# Patient Record
Sex: Female | Born: 1950 | Race: White | Hispanic: No | Marital: Married | Smoking: Never smoker
Health system: Southern US, Community
[De-identification: ages and names within clinical notes are randomized; demographics above are authoritative.]

## PROBLEM LIST (undated history)

## (undated) DIAGNOSIS — T8859XA Other complications of anesthesia, initial encounter: Secondary | ICD-10-CM

## (undated) DIAGNOSIS — H269 Unspecified cataract: Secondary | ICD-10-CM

## (undated) DIAGNOSIS — M21372 Foot drop, left foot: Secondary | ICD-10-CM

## (undated) DIAGNOSIS — I10 Essential (primary) hypertension: Secondary | ICD-10-CM

## (undated) DIAGNOSIS — E119 Type 2 diabetes mellitus without complications: Secondary | ICD-10-CM

## (undated) DIAGNOSIS — E7800 Pure hypercholesterolemia, unspecified: Secondary | ICD-10-CM

## (undated) DIAGNOSIS — K219 Gastro-esophageal reflux disease without esophagitis: Secondary | ICD-10-CM

## (undated) DIAGNOSIS — M21371 Foot drop, right foot: Secondary | ICD-10-CM

## (undated) DIAGNOSIS — G629 Polyneuropathy, unspecified: Secondary | ICD-10-CM

## (undated) DIAGNOSIS — M1990 Unspecified osteoarthritis, unspecified site: Secondary | ICD-10-CM

## (undated) DIAGNOSIS — M549 Dorsalgia, unspecified: Secondary | ICD-10-CM

## (undated) DIAGNOSIS — R233 Spontaneous ecchymoses: Secondary | ICD-10-CM

## (undated) DIAGNOSIS — T4145XA Adverse effect of unspecified anesthetic, initial encounter: Secondary | ICD-10-CM

## (undated) DIAGNOSIS — G971 Other reaction to spinal and lumbar puncture: Secondary | ICD-10-CM

## (undated) DIAGNOSIS — C801 Malignant (primary) neoplasm, unspecified: Secondary | ICD-10-CM

## (undated) DIAGNOSIS — M109 Gout, unspecified: Secondary | ICD-10-CM

## (undated) DIAGNOSIS — Z9889 Other specified postprocedural states: Secondary | ICD-10-CM

## (undated) DIAGNOSIS — R238 Other skin changes: Secondary | ICD-10-CM

## (undated) DIAGNOSIS — R112 Nausea with vomiting, unspecified: Secondary | ICD-10-CM

## (undated) DIAGNOSIS — G8929 Other chronic pain: Secondary | ICD-10-CM

## (undated) HISTORY — DX: Essential (primary) hypertension: I10

## (undated) HISTORY — PX: OTHER SURGICAL HISTORY: SHX169

## (undated) HISTORY — PX: CHOLECYSTECTOMY: SHX55

## (undated) HISTORY — DX: Pure hypercholesterolemia, unspecified: E78.00

## (undated) HISTORY — PX: COLONOSCOPY: SHX174

## (undated) HISTORY — PX: TOTAL ELBOW REPLACEMENT: SUR1214

## (undated) HISTORY — PX: STAPEDES SURGERY: SHX789

## (undated) HISTORY — PX: COLONOSCOPY WITH ESOPHAGOGASTRODUODENOSCOPY (EGD): SHX5779

## (undated) HISTORY — PX: JOINT REPLACEMENT: SHX530

## (undated) HISTORY — PX: ABDOMINAL HYSTERECTOMY: SHX81

## (undated) HISTORY — DX: Polyneuropathy, unspecified: G62.9

---

## 1978-10-23 HISTORY — PX: BACK SURGERY: SHX140

## 1987-10-24 DIAGNOSIS — C801 Malignant (primary) neoplasm, unspecified: Secondary | ICD-10-CM

## 1987-10-24 HISTORY — DX: Malignant (primary) neoplasm, unspecified: C80.1

## 2015-08-14 ENCOUNTER — Other Ambulatory Visit: Payer: Self-pay | Admitting: Orthopedic Surgery

## 2015-08-14 DIAGNOSIS — M5416 Radiculopathy, lumbar region: Secondary | ICD-10-CM

## 2015-08-31 ENCOUNTER — Ambulatory Visit
Admission: RE | Admit: 2015-08-31 | Discharge: 2015-08-31 | Disposition: A | Discharge status: Home / Self Care | Payer: Medicare HMO | Source: Ambulatory Visit | Attending: Orthopedic Surgery | Admitting: Orthopedic Surgery

## 2015-08-31 ENCOUNTER — Other Ambulatory Visit: Payer: Self-pay

## 2015-08-31 DIAGNOSIS — M5416 Radiculopathy, lumbar region: Secondary | ICD-10-CM

## 2015-09-01 ENCOUNTER — Ambulatory Visit
Admission: RE | Admit: 2015-09-01 | Discharge: 2015-09-01 | Disposition: A | Discharge status: Home / Self Care | Payer: Medicare HMO | Source: Ambulatory Visit | Attending: Orthopedic Surgery | Admitting: Orthopedic Surgery

## 2015-09-07 ENCOUNTER — Other Ambulatory Visit: Payer: Self-pay | Admitting: Orthopedic Surgery

## 2015-09-07 DIAGNOSIS — M546 Pain in thoracic spine: Secondary | ICD-10-CM

## 2015-09-24 ENCOUNTER — Ambulatory Visit
Admission: RE | Admit: 2015-09-24 | Discharge: 2015-09-24 | Disposition: A | Discharge status: Home / Self Care | Payer: Medicare HMO | Source: Ambulatory Visit | Attending: Orthopedic Surgery | Admitting: Orthopedic Surgery

## 2015-09-24 DIAGNOSIS — M546 Pain in thoracic spine: Secondary | ICD-10-CM

## 2015-10-26 DIAGNOSIS — M5416 Radiculopathy, lumbar region: Secondary | ICD-10-CM | POA: Diagnosis not present

## 2015-11-11 DIAGNOSIS — Z6837 Body mass index (BMI) 37.0-37.9, adult: Secondary | ICD-10-CM | POA: Diagnosis not present

## 2015-11-11 DIAGNOSIS — M4806 Spinal stenosis, lumbar region: Secondary | ICD-10-CM | POA: Diagnosis not present

## 2015-11-17 ENCOUNTER — Other Ambulatory Visit: Payer: Self-pay | Admitting: Neurological Surgery

## 2015-11-17 DIAGNOSIS — Z6837 Body mass index (BMI) 37.0-37.9, adult: Secondary | ICD-10-CM | POA: Diagnosis not present

## 2015-11-17 DIAGNOSIS — I10 Essential (primary) hypertension: Secondary | ICD-10-CM | POA: Diagnosis not present

## 2015-11-17 DIAGNOSIS — M4806 Spinal stenosis, lumbar region: Secondary | ICD-10-CM | POA: Diagnosis not present

## 2015-11-19 ENCOUNTER — Encounter (HOSPITAL_COMMUNITY)
Admission: RE | Admit: 2015-11-19 | Discharge: 2015-11-19 | Disposition: A | Discharge status: Home / Self Care | Payer: PPO | Source: Ambulatory Visit | Attending: Neurological Surgery | Admitting: Neurological Surgery

## 2015-11-19 ENCOUNTER — Encounter (HOSPITAL_COMMUNITY): Payer: Self-pay

## 2015-11-19 DIAGNOSIS — Z0183 Encounter for blood typing: Secondary | ICD-10-CM | POA: Insufficient documentation

## 2015-11-19 DIAGNOSIS — M4806 Spinal stenosis, lumbar region: Secondary | ICD-10-CM | POA: Insufficient documentation

## 2015-11-19 DIAGNOSIS — Z01812 Encounter for preprocedural laboratory examination: Secondary | ICD-10-CM | POA: Diagnosis not present

## 2015-11-19 HISTORY — DX: Foot drop, right foot: M21.371

## 2015-11-19 HISTORY — DX: Malignant (primary) neoplasm, unspecified: C80.1

## 2015-11-19 HISTORY — DX: Other specified postprocedural states: Z98.890

## 2015-11-19 HISTORY — DX: Other complications of anesthesia, initial encounter: T88.59XA

## 2015-11-19 HISTORY — DX: Type 2 diabetes mellitus without complications: E11.9

## 2015-11-19 HISTORY — DX: Adverse effect of unspecified anesthetic, initial encounter: T41.45XA

## 2015-11-19 HISTORY — DX: Unspecified osteoarthritis, unspecified site: M19.90

## 2015-11-19 HISTORY — DX: Foot drop, right foot: M21.372

## 2015-11-19 HISTORY — DX: Nausea with vomiting, unspecified: R11.2

## 2015-11-19 LAB — BASIC METABOLIC PANEL
Anion gap: 14 (ref 5–15)
BUN: 25 mg/dL — ABNORMAL HIGH (ref 6–20)
CALCIUM: 9.6 mg/dL (ref 8.9–10.3)
CO2: 24 mmol/L (ref 22–32)
CREATININE: 1.32 mg/dL — AB (ref 0.44–1.00)
Chloride: 106 mmol/L (ref 101–111)
GFR calc Af Amer: 48 mL/min — ABNORMAL LOW (ref >60)
GFR, EST NON AFRICAN AMERICAN: 41 mL/min — AB (ref >60)
GLUCOSE: 124 mg/dL — AB (ref 65–99)
Potassium: 4.1 mmol/L (ref 3.5–5.1)
Sodium: 144 mmol/L (ref 135–145)

## 2015-11-19 LAB — SURGICAL PCR SCREEN
MRSA, PCR: NEGATIVE
STAPHYLOCOCCUS AUREUS: NEGATIVE

## 2015-11-19 LAB — TYPE AND SCREEN
ABO/RH(D): O POS
ANTIBODY SCREEN: NEGATIVE

## 2015-11-19 LAB — GLUCOSE, CAPILLARY: GLUCOSE-CAPILLARY: 107 mg/dL — AB (ref 65–99)

## 2015-11-19 LAB — CBC
HCT: 39.8 % (ref 36.0–46.0)
Hemoglobin: 13 g/dL (ref 12.0–15.0)
MCH: 29.8 pg (ref 26.0–34.0)
MCHC: 32.7 g/dL (ref 30.0–36.0)
MCV: 91.3 fL (ref 78.0–100.0)
PLATELETS: 269 10*3/uL (ref 150–400)
RBC: 4.36 MIL/uL (ref 3.87–5.11)
RDW: 14.3 % (ref 11.5–15.5)
WBC: 10.7 10*3/uL — ABNORMAL HIGH (ref 4.0–10.5)

## 2015-11-19 LAB — ABO/RH: ABO/RH(D): O POS

## 2015-11-19 NOTE — Progress Notes (Signed)
Pt. Reports that she had a stress test several yrs. Ago, reports that it was wnl, no need for f/u with cardiology.  Pt. Is followed by Dr.  Unk Lightning in [place]. Requesting records from [facility]. For ekg & CxR , done 2016 prior to knee replacement.

## 2015-11-19 NOTE — Progress Notes (Signed)
Call to [facility] at Dr. Ellene Route office, informed that we are still waiting for preop orders.

## 2015-11-19 NOTE — Pre-Procedure Instructions (Signed)
Karen Reynolds  11/19/2015      [facility] [place], [place] [address] [place] [address] Phone: [phone] Fax: [phone]    Your procedure is scheduled on 11/23/2015.  Report to [facility] Admitting at 10:30 A.M.  Call this number if you have problems the morning of surgery:  [phone]   Remember:  Do not eat food or drink liquids after midnight.  Take these medicines the morning of surgery with A SIP OF WATER : ALLOPURINOL, AMLODIPINE, METOPROLOL, OMEPRAZOLE   Do not wear jewelry, make-up or nail polish.   Do not wear lotions, powders, or perfumes.      Do not shave 48 hours prior to surgery.    Do not bring valuables to the hospital.    [facility] is not responsible for any belongings or valuables.  Contacts, dentures or bridgework may not be worn into surgery.  Leave your suitcase in the car.  After surgery it may be brought to your room.  For patients admitted to the hospital, discharge time will be determined by your treatment team.  Patients discharged the day of surgery will not be allowed to drive home.   Name and phone number of your driver:   /w spouse   Special instructions:How to Manage Your Diabetes Before Surgery   Why is it important to control my blood sugar before and after surgery?   Improving blood sugar levels before and after surgery helps healing and can limit problems.  A way of improving blood sugar control is eating a healthy diet by:  - Eating less sugar and carbohydrates  - Increasing activity/exercise  - Talk with your doctor about reaching your blood sugar goals  High blood sugars (greater than 180 mg/dL) can raise your risk of infections and slow down your recovery so you will need to focus on controlling your diabetes during the weeks before surgery.  Make sure that the doctor who takes care of your diabetes knows about your planned surgery including the date and  location.  How do I manage my blood sugars before surgery?   Check your blood sugar at least 4 times a day, 2 days before surgery to make sure that they are not too high or low.   Check your blood sugar the morning of your surgery when you wake up and every 2               hours until you get to the Short-Stay unit.  If your blood sugar is less than 70 mg/dL, you will need to treat for low blood sugar by:  Treat a low blood sugar (less than 70 mg/dL) with 1/2 cup of clear juice (cranberry or apple), 4 glucose tablets, OR glucose gel.  Recheck blood sugar in 15 minutes after treatment (to make sure it is greater than 70 mg/dL).  If blood sugar is not greater than 70 mg/dL on re-check, call [phone] for further instructions.   Report your blood sugar to the Short-Stay nurse when you get to Short-Stay.  References:  [facility], 2007 "How to Manage your Diabetes Before and After Surgery".  What do I do about my diabetes medications?   Do not take oral diabetes medicines (pills) the morning of surgery.       Special Instructions: [facility] - Preparing for Surgery  Before surgery, you can play an important role.  Because skin is  not sterile, your skin needs to be as free of germs as possible.  You can reduce the number of germs on you skin by washing with CHG (chlorahexidine gluconate) soap before surgery.  CHG is an antiseptic cleaner which kills germs and bonds with the skin to continue killing germs even after washing.  Please DO NOT use if you have an allergy to CHG or antibacterial soaps.  If your skin becomes reddened/irritated stop using the CHG and inform your nurse when you arrive at Short Stay.  Do not shave (including legs and underarms) for at least 48 hours prior to the first CHG shower.  You may shave your face.  Please follow these instructions carefully:   1.  Shower with CHG Soap the night before surgery and the  morning of  Surgery.  2.  If you choose to wash your hair, wash your hair first as usual with your  normal shampoo.  3.  After you shampoo, rinse your hair and body thoroughly to remove the  Shampoo.  4.  Use CHG as you would any other liquid soap.  You can apply chg directly to the skin and wash gently with scrungie or a clean washcloth.  5.  Apply the CHG Soap to your body ONLY FROM THE NECK DOWN.    Do not use on open wounds or open sores.  Avoid contact with your eyes, ears, mouth and genitals (private parts).  Wash genitals (private parts)   with your normal soap.  6.  Wash thoroughly, paying special attention to the area where your surgery will be performed.  7.  Thoroughly rinse your body with warm water from the neck down.  8.  DO NOT shower/wash with your normal soap after using and rinsing off   the CHG Soap.  9.  Pat yourself dry with a clean towel.            10.  Wear clean pajamas.            11.  Place clean sheets on your bed the night of your first shower and do not sleep with pets.  Day of Surgery  Do not apply any lotions/deodorants the morning of surgery.  Please wear clean clothes to the hospital/surgery center.  Please read over the following fact sheets that you were given. Pain Booklet, Coughing and Deep Breathing, Blood Transfusion Information, MRSA Information and Surgical Site Infection Prevention

## 2015-11-20 DIAGNOSIS — M62551 Muscle wasting and atrophy, not elsewhere classified, right thigh: Secondary | ICD-10-CM | POA: Diagnosis not present

## 2015-11-20 LAB — HEMOGLOBIN A1C
HEMOGLOBIN A1C: 6.6 % — AB (ref 4.8–5.6)
MEAN PLASMA GLUCOSE: 143 mg/dL

## 2015-11-22 MED ORDER — CEFAZOLIN SODIUM-DEXTROSE 2-3 GM-% IV SOLR
2.0000 g | INTRAVENOUS | Status: AC
  Administered 2015-11-23: 2 g via INTRAVENOUS
  Filled 2015-11-22: qty 50

## 2015-11-23 ENCOUNTER — Encounter (HOSPITAL_COMMUNITY): Payer: Self-pay | Admitting: Surgery

## 2015-11-23 ENCOUNTER — Inpatient Hospital Stay (HOSPITAL_COMMUNITY)
Admission: RE | Admit: 2015-11-23 | Discharge: 2015-12-01 | DRG: 460 | Disposition: A | Discharge status: Skilled Nursing Facility | Payer: PPO | Source: Ambulatory Visit | Attending: Neurological Surgery | Admitting: Neurological Surgery

## 2015-11-23 ENCOUNTER — Encounter (HOSPITAL_COMMUNITY)
Admission: RE | Disposition: A | Discharge status: Skilled Nursing Facility | Payer: PPO | Source: Ambulatory Visit | Attending: Neurological Surgery

## 2015-11-23 ENCOUNTER — Inpatient Hospital Stay (HOSPITAL_COMMUNITY): Payer: PPO | Admitting: Certified Registered Nurse Anesthetist

## 2015-11-23 ENCOUNTER — Inpatient Hospital Stay (HOSPITAL_COMMUNITY): Payer: PPO

## 2015-11-23 DIAGNOSIS — M109 Gout, unspecified: Secondary | ICD-10-CM | POA: Diagnosis not present

## 2015-11-23 DIAGNOSIS — Z7984 Long term (current) use of oral hypoglycemic drugs: Secondary | ICD-10-CM | POA: Diagnosis not present

## 2015-11-23 DIAGNOSIS — Z8541 Personal history of malignant neoplasm of cervix uteri: Secondary | ICD-10-CM | POA: Diagnosis not present

## 2015-11-23 DIAGNOSIS — Z7982 Long term (current) use of aspirin: Secondary | ICD-10-CM

## 2015-11-23 DIAGNOSIS — M4316 Spondylolisthesis, lumbar region: Principal | ICD-10-CM | POA: Diagnosis present

## 2015-11-23 DIAGNOSIS — M1990 Unspecified osteoarthritis, unspecified site: Secondary | ICD-10-CM | POA: Diagnosis not present

## 2015-11-23 DIAGNOSIS — M5416 Radiculopathy, lumbar region: Secondary | ICD-10-CM | POA: Diagnosis present

## 2015-11-23 DIAGNOSIS — M21372 Foot drop, left foot: Secondary | ICD-10-CM | POA: Diagnosis not present

## 2015-11-23 DIAGNOSIS — Z79899 Other long term (current) drug therapy: Secondary | ICD-10-CM

## 2015-11-23 DIAGNOSIS — E119 Type 2 diabetes mellitus without complications: Secondary | ICD-10-CM | POA: Diagnosis not present

## 2015-11-23 DIAGNOSIS — M4326 Fusion of spine, lumbar region: Secondary | ICD-10-CM | POA: Diagnosis not present

## 2015-11-23 DIAGNOSIS — M4317 Spondylolisthesis, lumbosacral region: Secondary | ICD-10-CM | POA: Diagnosis not present

## 2015-11-23 DIAGNOSIS — M21371 Foot drop, right foot: Secondary | ICD-10-CM | POA: Diagnosis present

## 2015-11-23 DIAGNOSIS — Z01818 Encounter for other preprocedural examination: Secondary | ICD-10-CM | POA: Diagnosis not present

## 2015-11-23 DIAGNOSIS — M4806 Spinal stenosis, lumbar region: Secondary | ICD-10-CM | POA: Diagnosis not present

## 2015-11-23 DIAGNOSIS — M4807 Spinal stenosis, lumbosacral region: Secondary | ICD-10-CM | POA: Diagnosis not present

## 2015-11-23 DIAGNOSIS — Z419 Encounter for procedure for purposes other than remedying health state, unspecified: Secondary | ICD-10-CM

## 2015-11-23 DIAGNOSIS — M79606 Pain in leg, unspecified: Secondary | ICD-10-CM | POA: Diagnosis not present

## 2015-11-23 HISTORY — DX: Gout, unspecified: M10.9

## 2015-11-23 LAB — GLUCOSE, CAPILLARY
GLUCOSE-CAPILLARY: 157 mg/dL — AB (ref 65–99)
Glucose-Capillary: 128 mg/dL — ABNORMAL HIGH (ref 65–99)

## 2015-11-23 SURGERY — POSTERIOR LUMBAR FUSION 2 LEVEL
Anesthesia: General | Site: Back

## 2015-11-23 MED ORDER — LACTATED RINGERS IV SOLN
INTRAVENOUS | Status: DC | PRN
  Administered 2015-11-23 (×3): via INTRAVENOUS

## 2015-11-23 MED ORDER — CEFAZOLIN SODIUM 1-5 GM-% IV SOLN
1.0000 g | Freq: Three times a day (TID) | INTRAVENOUS | Status: AC
  Administered 2015-11-23 – 2015-11-24 (×2): 1 g via INTRAVENOUS
  Filled 2015-11-23 (×3): qty 50

## 2015-11-23 MED ORDER — PANTOPRAZOLE SODIUM 40 MG PO TBEC
40.0000 mg | DELAYED_RELEASE_TABLET | Freq: Every day | ORAL | Status: DC
  Administered 2015-11-24 – 2015-12-01 (×8): 40 mg via ORAL
  Filled 2015-11-23 (×8): qty 1

## 2015-11-23 MED ORDER — PROMETHAZINE HCL 25 MG/ML IJ SOLN
INTRAMUSCULAR | Status: AC
  Administered 2015-11-23: 6.25 mg via INTRAVENOUS
  Filled 2015-11-23: qty 1

## 2015-11-23 MED ORDER — DEXAMETHASONE SODIUM PHOSPHATE 4 MG/ML IJ SOLN
INTRAMUSCULAR | Status: AC
  Filled 2015-11-23: qty 2

## 2015-11-23 MED ORDER — FENTANYL CITRATE (PF) 100 MCG/2ML IJ SOLN
INTRAMUSCULAR | Status: DC | PRN
  Administered 2015-11-23 (×2): 50 ug via INTRAVENOUS
  Administered 2015-11-23: 100 ug via INTRAVENOUS
  Administered 2015-11-23: 50 ug via INTRAVENOUS

## 2015-11-23 MED ORDER — ATORVASTATIN CALCIUM 10 MG PO TABS
10.0000 mg | ORAL_TABLET | Freq: Every day | ORAL | Status: DC
  Administered 2015-11-24 – 2015-12-01 (×8): 10 mg via ORAL
  Filled 2015-11-23 (×8): qty 1

## 2015-11-23 MED ORDER — SODIUM CHLORIDE 0.9 % IV SOLN
INTRAVENOUS | Status: DC
  Administered 2015-11-23: 23:00:00 via INTRAVENOUS

## 2015-11-23 MED ORDER — HYDROMORPHONE HCL 1 MG/ML IJ SOLN
0.2500 mg | INTRAMUSCULAR | Status: DC | PRN
  Administered 2015-11-23 (×2): 0.5 mg via INTRAVENOUS

## 2015-11-23 MED ORDER — FENTANYL CITRATE (PF) 250 MCG/5ML IJ SOLN
INTRAMUSCULAR | Status: AC
  Filled 2015-11-23: qty 5

## 2015-11-23 MED ORDER — PROPOFOL 10 MG/ML IV BOLUS
INTRAVENOUS | Status: DC | PRN
  Administered 2015-11-23: 150 mg via INTRAVENOUS

## 2015-11-23 MED ORDER — KETOROLAC TROMETHAMINE 15 MG/ML IJ SOLN
INTRAMUSCULAR | Status: AC
  Administered 2015-11-23: 15 mg via INTRAVENOUS
  Filled 2015-11-23: qty 1

## 2015-11-23 MED ORDER — LIDOCAINE HCL (CARDIAC) 20 MG/ML IV SOLN
INTRAVENOUS | Status: AC
  Filled 2015-11-23: qty 5

## 2015-11-23 MED ORDER — OXYCODONE-ACETAMINOPHEN 5-325 MG PO TABS
1.0000 | ORAL_TABLET | ORAL | Status: DC | PRN
  Administered 2015-11-27: 2 via ORAL
  Administered 2015-11-27 – 2015-11-28 (×3): 1 via ORAL
  Administered 2015-11-28 – 2015-11-29 (×3): 2 via ORAL
  Filled 2015-11-23 (×3): qty 2
  Filled 2015-11-23 (×3): qty 1
  Filled 2015-11-23: qty 2

## 2015-11-23 MED ORDER — SURGIFOAM 100 EX MISC
CUTANEOUS | Status: DC | PRN
  Administered 2015-11-23: 18:00:00 via TOPICAL

## 2015-11-23 MED ORDER — ROCURONIUM BROMIDE 50 MG/5ML IV SOLN
INTRAVENOUS | Status: AC
  Filled 2015-11-23: qty 1

## 2015-11-23 MED ORDER — HYDROCHLOROTHIAZIDE 25 MG PO TABS
25.0000 mg | ORAL_TABLET | Freq: Every day | ORAL | Status: DC
  Administered 2015-11-24 – 2015-11-30 (×7): 25 mg via ORAL
  Filled 2015-11-23 (×7): qty 1

## 2015-11-23 MED ORDER — ONDANSETRON HCL 4 MG/2ML IJ SOLN: 4.0000 mg | INTRAMUSCULAR | Status: DC | PRN

## 2015-11-23 MED ORDER — ONDANSETRON HCL 4 MG/2ML IJ SOLN
INTRAMUSCULAR | Status: AC
  Filled 2015-11-23: qty 2

## 2015-11-23 MED ORDER — SENNA 8.6 MG PO TABS
1.0000 | ORAL_TABLET | Freq: Two times a day (BID) | ORAL | Status: DC
  Administered 2015-11-23 – 2015-12-01 (×14): 8.6 mg via ORAL
  Filled 2015-11-23 (×16): qty 1

## 2015-11-23 MED ORDER — DOCUSATE SODIUM 100 MG PO CAPS
100.0000 mg | ORAL_CAPSULE | Freq: Two times a day (BID) | ORAL | Status: DC
  Administered 2015-11-23 – 2015-12-01 (×14): 100 mg via ORAL
  Filled 2015-11-23 (×16): qty 1

## 2015-11-23 MED ORDER — 0.9 % SODIUM CHLORIDE (POUR BTL) OPTIME
TOPICAL | Status: DC | PRN
  Administered 2015-11-23: 1000 mL

## 2015-11-23 MED ORDER — MIDAZOLAM HCL 2 MG/2ML IJ SOLN
INTRAMUSCULAR | Status: AC
  Filled 2015-11-23: qty 2

## 2015-11-23 MED ORDER — POLYETHYLENE GLYCOL 3350 17 G PO PACK: 17.0000 g | PACK | Freq: Every day | ORAL | Status: DC | PRN

## 2015-11-23 MED ORDER — AMLODIPINE BESYLATE 10 MG PO TABS
10.0000 mg | ORAL_TABLET | Freq: Every day | ORAL | Status: DC
  Administered 2015-11-24 – 2015-11-30 (×7): 10 mg via ORAL
  Filled 2015-11-23 (×7): qty 1

## 2015-11-23 MED ORDER — PHENOL 1.4 % MT LIQD: 1.0000 | OROMUCOSAL | Status: DC | PRN

## 2015-11-23 MED ORDER — ALLOPURINOL 100 MG PO TABS
300.0000 mg | ORAL_TABLET | Freq: Every day | ORAL | Status: DC
  Administered 2015-11-24 – 2015-12-01 (×8): 300 mg via ORAL
  Filled 2015-11-23 (×8): qty 3

## 2015-11-23 MED ORDER — ARTIFICIAL TEARS OP OINT
TOPICAL_OINTMENT | OPHTHALMIC | Status: DC | PRN
  Administered 2015-11-23: 1 via OPHTHALMIC

## 2015-11-23 MED ORDER — METHOCARBAMOL 500 MG PO TABS
500.0000 mg | ORAL_TABLET | Freq: Four times a day (QID) | ORAL | Status: DC | PRN
  Administered 2015-11-26 – 2015-12-01 (×13): 500 mg via ORAL
  Filled 2015-11-23 (×14): qty 1

## 2015-11-23 MED ORDER — BUPIVACAINE HCL (PF) 0.5 % IJ SOLN
INTRAMUSCULAR | Status: DC | PRN
Start: 2015-11-23 — End: 2015-11-23
  Administered 2015-11-23: 5 mL
  Administered 2015-11-23: 20 mL

## 2015-11-23 MED ORDER — VALACYCLOVIR HCL 500 MG PO TABS: 1000.0000 mg | ORAL_TABLET | Freq: Every day | ORAL | Status: DC | PRN

## 2015-11-23 MED ORDER — MIDAZOLAM HCL 5 MG/5ML IJ SOLN
INTRAMUSCULAR | Status: DC | PRN
  Administered 2015-11-23: 2 mg via INTRAVENOUS

## 2015-11-23 MED ORDER — LACTATED RINGERS IV SOLN: INTRAVENOUS | Status: DC

## 2015-11-23 MED ORDER — SODIUM CHLORIDE 0.9 % IV SOLN: 250.0000 mL | INTRAVENOUS | Status: DC

## 2015-11-23 MED ORDER — SODIUM CHLORIDE 0.9 % IR SOLN
Status: DC | PRN
  Administered 2015-11-23: 18:00:00

## 2015-11-23 MED ORDER — VANCOMYCIN HCL 1000 MG IV SOLR
INTRAVENOUS | Status: AC
  Filled 2015-11-23: qty 1000

## 2015-11-23 MED ORDER — ACETAMINOPHEN 325 MG PO TABS: 650.0000 mg | ORAL_TABLET | ORAL | Status: DC | PRN

## 2015-11-23 MED ORDER — LIDOCAINE HCL (CARDIAC) 20 MG/ML IV SOLN
INTRAVENOUS | Status: AC
  Filled 2015-11-23: qty 15

## 2015-11-23 MED ORDER — DEXTROSE 5 % IV SOLN
500.0000 mg | Freq: Four times a day (QID) | INTRAVENOUS | Status: DC | PRN
  Filled 2015-11-23: qty 5

## 2015-11-23 MED ORDER — LIDOCAINE HCL (CARDIAC) 20 MG/ML IV SOLN
INTRAVENOUS | Status: DC | PRN
  Administered 2015-11-23: 60 mg via INTRAVENOUS

## 2015-11-23 MED ORDER — INSULIN ASPART 100 UNIT/ML ~~LOC~~ SOLN
0.0000 [IU] | Freq: Three times a day (TID) | SUBCUTANEOUS | Status: DC
  Administered 2015-11-24 (×2): 4 [IU] via SUBCUTANEOUS
  Administered 2015-11-25 – 2015-11-29 (×10): 3 [IU] via SUBCUTANEOUS

## 2015-11-23 MED ORDER — FLEET ENEMA 7-19 GM/118ML RE ENEM: 1.0000 | ENEMA | Freq: Once | RECTAL | Status: DC | PRN

## 2015-11-23 MED ORDER — THROMBIN 5000 UNITS EX SOLR
OROMUCOSAL | Status: DC | PRN
  Administered 2015-11-23: 18:00:00 via TOPICAL

## 2015-11-23 MED ORDER — ARTIFICIAL TEARS OP OINT
TOPICAL_OINTMENT | OPHTHALMIC | Status: AC
  Filled 2015-11-23: qty 3.5

## 2015-11-23 MED ORDER — LACTATED RINGERS IV SOLN
INTRAVENOUS | Status: DC
  Administered 2015-11-23: 10:00:00 via INTRAVENOUS

## 2015-11-23 MED ORDER — SODIUM CHLORIDE 0.9% FLUSH: 3.0000 mL | INTRAVENOUS | Status: DC | PRN

## 2015-11-23 MED ORDER — VANCOMYCIN HCL 1000 MG IV SOLR
INTRAVENOUS | Status: DC | PRN
  Administered 2015-11-23: 1000 mg

## 2015-11-23 MED ORDER — HYDROMORPHONE HCL 1 MG/ML IJ SOLN
INTRAMUSCULAR | Status: AC
  Administered 2015-11-23: 0.5 mg via INTRAVENOUS
  Filled 2015-11-23: qty 1

## 2015-11-23 MED ORDER — GLYCOPYRROLATE 0.2 MG/ML IJ SOLN
INTRAMUSCULAR | Status: DC | PRN
  Administered 2015-11-23: 0.2 mg via INTRAVENOUS

## 2015-11-23 MED ORDER — METFORMIN HCL 500 MG PO TABS
1000.0000 mg | ORAL_TABLET | Freq: Two times a day (BID) | ORAL | Status: DC
Start: 2015-11-24 — End: 2015-12-01
  Administered 2015-11-24 – 2015-12-01 (×15): 1000 mg via ORAL
  Filled 2015-11-23 (×15): qty 2

## 2015-11-23 MED ORDER — LIDOCAINE-EPINEPHRINE 1 %-1:100000 IJ SOLN
INTRAMUSCULAR | Status: DC | PRN
  Administered 2015-11-23: 5 mL

## 2015-11-23 MED ORDER — EPHEDRINE SULFATE 50 MG/ML IJ SOLN
INTRAMUSCULAR | Status: DC | PRN
  Administered 2015-11-23 (×4): 10 mg via INTRAVENOUS

## 2015-11-23 MED ORDER — SODIUM CHLORIDE 0.9% FLUSH
3.0000 mL | Freq: Two times a day (BID) | INTRAVENOUS | Status: DC
  Administered 2015-11-24 – 2015-11-29 (×6): 3 mL via INTRAVENOUS

## 2015-11-23 MED ORDER — HYDROMORPHONE HCL 1 MG/ML IJ SOLN
0.5000 mg | INTRAMUSCULAR | Status: DC | PRN
  Administered 2015-11-24 – 2015-11-26 (×8): 1 mg via INTRAVENOUS
  Filled 2015-11-23 (×9): qty 1

## 2015-11-23 MED ORDER — PROPOFOL 10 MG/ML IV BOLUS
INTRAVENOUS | Status: AC
  Filled 2015-11-23: qty 20

## 2015-11-23 MED ORDER — BISACODYL 10 MG RE SUPP: 10.0000 mg | Freq: Every day | RECTAL | Status: DC | PRN

## 2015-11-23 MED ORDER — ACETAMINOPHEN 650 MG RE SUPP: 650.0000 mg | RECTAL | Status: DC | PRN

## 2015-11-23 MED ORDER — HYDROCODONE-ACETAMINOPHEN 5-325 MG PO TABS
1.0000 | ORAL_TABLET | ORAL | Status: DC | PRN
  Administered 2015-11-23 – 2015-11-27 (×15): 2 via ORAL
  Administered 2015-11-28: 1 via ORAL
  Administered 2015-11-28: 2 via ORAL
  Administered 2015-11-28: 1 via ORAL
  Administered 2015-11-29 (×3): 2 via ORAL
  Filled 2015-11-23 (×2): qty 2
  Filled 2015-11-23: qty 1
  Filled 2015-11-23: qty 2
  Filled 2015-11-23: qty 1
  Filled 2015-11-23 (×18): qty 2

## 2015-11-23 MED ORDER — KETOROLAC TROMETHAMINE 15 MG/ML IJ SOLN
15.0000 mg | Freq: Four times a day (QID) | INTRAMUSCULAR | Status: DC
  Administered 2015-11-23 – 2015-11-24 (×2): 15 mg via INTRAVENOUS
  Filled 2015-11-23: qty 1

## 2015-11-23 MED ORDER — PROMETHAZINE HCL 25 MG/ML IJ SOLN
6.2500 mg | INTRAMUSCULAR | Status: DC | PRN
  Administered 2015-11-23: 6.25 mg via INTRAVENOUS

## 2015-11-23 MED ORDER — METOPROLOL TARTRATE 50 MG PO TABS
50.0000 mg | ORAL_TABLET | Freq: Two times a day (BID) | ORAL | Status: DC
  Administered 2015-11-24 – 2015-11-30 (×13): 50 mg via ORAL
  Filled 2015-11-23 (×14): qty 1

## 2015-11-23 MED ORDER — ALUM & MAG HYDROXIDE-SIMETH 200-200-20 MG/5ML PO SUSP: 30.0000 mL | Freq: Four times a day (QID) | ORAL | Status: DC | PRN

## 2015-11-23 MED ORDER — MENTHOL 3 MG MT LOZG
1.0000 | LOZENGE | OROMUCOSAL | Status: DC | PRN
  Administered 2015-11-24: 3 mg via ORAL
  Filled 2015-11-23: qty 9

## 2015-11-23 MED ORDER — DEXAMETHASONE SODIUM PHOSPHATE 4 MG/ML IJ SOLN
INTRAMUSCULAR | Status: DC | PRN
  Administered 2015-11-23 (×2): 4 mg via INTRAVENOUS

## 2015-11-23 MED ORDER — ONDANSETRON HCL 4 MG/2ML IJ SOLN
INTRAMUSCULAR | Status: DC | PRN
  Administered 2015-11-23 (×2): 4 mg via INTRAVENOUS

## 2015-11-23 MED ORDER — ROCURONIUM BROMIDE 100 MG/10ML IV SOLN
INTRAVENOUS | Status: DC | PRN
  Administered 2015-11-23: 50 mg via INTRAVENOUS

## 2015-11-23 SURGICAL SUPPLY — 70 items
BAG DECANTER FOR FLEXI CONT (MISCELLANEOUS) ×3 IMPLANT
BLADE CLIPPER SURG (BLADE) IMPLANT
BONE EQUIVA 10CC (Bone Implant) ×3 IMPLANT
BUR MATCHSTICK NEURO 3.0 LAGG (BURR) ×3 IMPLANT
CANISTER SUCT 3000ML PPV (MISCELLANEOUS) ×3 IMPLANT
CONT SPEC 4OZ CLIKSEAL STRL BL (MISCELLANEOUS) ×3 IMPLANT
COVER BACK TABLE 60X90IN (DRAPES) ×3 IMPLANT
DECANTER SPIKE VIAL GLASS SM (MISCELLANEOUS) ×3 IMPLANT
DERMABOND ADVANCED (GAUZE/BANDAGES/DRESSINGS) ×2
DERMABOND ADVANCED .7 DNX12 (GAUZE/BANDAGES/DRESSINGS) ×1 IMPLANT
DRAPE C-ARM 42X72 X-RAY (DRAPES) ×6 IMPLANT
DRAPE LAPAROTOMY 100X72X124 (DRAPES) ×3 IMPLANT
DRAPE POUCH INSTRU U-SHP 10X18 (DRAPES) ×3 IMPLANT
DRAPE PROXIMA HALF (DRAPES) ×3 IMPLANT
DRSG OPSITE POSTOP 4X6 (GAUZE/BANDAGES/DRESSINGS) ×3 IMPLANT
DURAPREP 26ML APPLICATOR (WOUND CARE) ×3 IMPLANT
ELECT BLADE 4.0 EZ CLEAN MEGAD (MISCELLANEOUS) ×3
ELECT REM PT RETURN 9FT ADLT (ELECTROSURGICAL) ×3
ELECTRODE BLDE 4.0 EZ CLN MEGD (MISCELLANEOUS) ×1 IMPLANT
ELECTRODE REM PT RTRN 9FT ADLT (ELECTROSURGICAL) ×1 IMPLANT
GAUZE SPONGE 4X4 12PLY STRL (GAUZE/BANDAGES/DRESSINGS) IMPLANT
GAUZE SPONGE 4X4 16PLY XRAY LF (GAUZE/BANDAGES/DRESSINGS) IMPLANT
GLOVE BIO SURGEON STRL SZ 6.5 (GLOVE) ×6 IMPLANT
GLOVE BIO SURGEON STRL SZ7 (GLOVE) ×3 IMPLANT
GLOVE BIO SURGEONS STRL SZ 6.5 (GLOVE) ×3
GLOVE BIOGEL PI IND STRL 6.5 (GLOVE) ×1 IMPLANT
GLOVE BIOGEL PI IND STRL 7.5 (GLOVE) ×1 IMPLANT
GLOVE BIOGEL PI IND STRL 8.5 (GLOVE) ×3 IMPLANT
GLOVE BIOGEL PI INDICATOR 6.5 (GLOVE) ×2
GLOVE BIOGEL PI INDICATOR 7.5 (GLOVE) ×2
GLOVE BIOGEL PI INDICATOR 8.5 (GLOVE) ×6
GLOVE ECLIPSE 8.0 STRL XLNG CF (GLOVE) ×3 IMPLANT
GLOVE ECLIPSE 8.5 STRL (GLOVE) ×6 IMPLANT
GLOVE EXAM NITRILE LRG STRL (GLOVE) IMPLANT
GLOVE EXAM NITRILE MD LF STRL (GLOVE) IMPLANT
GLOVE EXAM NITRILE XL STR (GLOVE) IMPLANT
GLOVE EXAM NITRILE XS STR PU (GLOVE) IMPLANT
GOWN STRL REUS W/ TWL LRG LVL3 (GOWN DISPOSABLE) ×2 IMPLANT
GOWN STRL REUS W/ TWL XL LVL3 (GOWN DISPOSABLE) ×1 IMPLANT
GOWN STRL REUS W/TWL 2XL LVL3 (GOWN DISPOSABLE) ×6 IMPLANT
GOWN STRL REUS W/TWL LRG LVL3 (GOWN DISPOSABLE) ×4
GOWN STRL REUS W/TWL XL LVL3 (GOWN DISPOSABLE) ×2
HEMOSTAT POWDER KIT SURGIFOAM (HEMOSTASIS) ×3 IMPLANT
KIT BASIN OR (CUSTOM PROCEDURE TRAY) ×3 IMPLANT
KIT ROOM TURNOVER OR (KITS) ×3 IMPLANT
NEEDLE HYPO 22GX1.5 SAFETY (NEEDLE) ×3 IMPLANT
NEEDLE SPNL 18GX3.5 QUINCKE PK (NEEDLE) IMPLANT
NS IRRIG 1000ML POUR BTL (IV SOLUTION) ×3 IMPLANT
PACK LAMINECTOMY NEURO (CUSTOM PROCEDURE TRAY) ×3 IMPLANT
PAD ARMBOARD 7.5X6 YLW CONV (MISCELLANEOUS) ×12 IMPLANT
PATTIES SURGICAL .5 X1 (DISPOSABLE) ×3 IMPLANT
PATTIES SURGICAL 1X1 (DISPOSABLE) ×3 IMPLANT
ROD TI ALLOY CURVED VIT 5.5X45 (Rod) ×4 IMPLANT
SCREW VITALITY PA 6.5X45MM (Screw) ×18 IMPLANT
SPACER ZYSTON STRT 12X25X10X8 (Spacer) ×4 IMPLANT
SPACER ZYSTON STRT 8X25X10WXX8 (Spacer) ×6 IMPLANT
SPONGE LAP 4X18 X RAY DECT (DISPOSABLE) IMPLANT
SPONGE SURGIFOAM ABS GEL 100 (HEMOSTASIS) ×3 IMPLANT
SUT VIC AB 1 CT1 18XBRD ANBCTR (SUTURE) ×1 IMPLANT
SUT VIC AB 1 CT1 8-18 (SUTURE) ×2
SUT VIC AB 2-0 CP2 18 (SUTURE) ×3 IMPLANT
SUT VIC AB 3-0 SH 8-18 (SUTURE) ×3 IMPLANT
SYR 3ML LL SCALE MARK (SYRINGE) ×12 IMPLANT
SYR 5ML LL (SYRINGE) IMPLANT
TOP CLOSURE TORQ LIMIT (Neuro Prosthesis/Implant) ×12 IMPLANT
TOWEL OR 17X24 6PK STRL BLUE (TOWEL DISPOSABLE) ×3 IMPLANT
TOWEL OR 17X26 10 PK STRL BLUE (TOWEL DISPOSABLE) ×3 IMPLANT
TRAP SPECIMEN MUCOUS 40CC (MISCELLANEOUS) ×3 IMPLANT
TRAY FOLEY W/METER SILVER 14FR (SET/KITS/TRAYS/PACK) ×3 IMPLANT
WATER STERILE IRR 1000ML POUR (IV SOLUTION) ×3 IMPLANT

## 2015-11-23 NOTE — Progress Notes (Signed)
Patient arrived to 5C09 AAOx4 but sleepy. Family is at the bedside. SCDs on and call bell at her side. Vitals taken and stable. No nasuea at this time. Pain under control. Ortho tech was paged and said they will bring her brace in the morning. Will continue to monitor. Stelios Kirby, Rande Brunt, RN

## 2015-11-23 NOTE — Transfer of Care (Signed)
Immediate Anesthesia Transfer of Care Note  Patient: Karen Reynolds  Procedure(s) Performed: Procedure(s) with comments: Lumbar four-five, Lumbar five-Sacral one Posterior lumbar interbody fusion (N/A) - L4-5 L5-S1 Posterior lumbar interbody fusion  Patient Location: PACU  Anesthesia Type:General  Level of Consciousness: awake, alert  and oriented  Airway & Oxygen Therapy: Patient Spontanous Breathing and Patient connected to nasal cannula oxygen  Post-op Assessment: Report given to RN and Post -op Vital signs reviewed and stable  Post vital signs: Reviewed and stable  Last Vitals:  Filed Vitals:   11/23/15 1013  BP: 150/73  Pulse: 72  Temp: 36.7 C  Resp: 20    Complications: No apparent anesthesia complications

## 2015-11-23 NOTE — Progress Notes (Signed)
Patient ID: Karen Reynolds, female   DOB: 1951/07/13, 65 y.o.   MRN: [id number] Vital signs are stable Motor function appears about the same Bilateral foot drops with trace movement in extensor hallucis longus on the left. Dressing is clean and dry Tolerating things reasonably well

## 2015-11-23 NOTE — Anesthesia Preprocedure Evaluation (Addendum)
Anesthesia Evaluation  Patient identified by MRN, date of birth, ID band Patient awake    Reviewed: Allergy & Precautions, H&P , NPO status , Patient's Chart, lab work & pertinent test results, reviewed documented beta blocker date and time   History of Anesthesia Complications (+) PONV and history of anesthetic complications  Airway Mallampati: II  TM Distance: >3 FB Neck ROM: full    Dental no notable dental hx. (+) Dental Advisory Given, Teeth Intact,    Pulmonary neg pulmonary ROS,    Pulmonary exam normal breath sounds clear to auscultation       Cardiovascular hypertension, Pt. on medications and Pt. on home beta blockers Normal cardiovascular exam Rhythm:regular Rate:Normal     Neuro/Psych Bilateral foot drop negative neurological ROS  negative psych ROS   GI/Hepatic negative GI ROS, Neg liver ROS, GERD  Medicated,  Endo/Other  diabetes, Well Controlled, Type 2, Oral Hypoglycemic AgentsMorbid obesity  Renal/GU negative Renal ROS  negative genitourinary   Musculoskeletal  (+) Arthritis ,   Abdominal   Peds  Hematology negative hematology ROS (+)   Anesthesia Other Findings Pt right tooth has cap on it. Dental advisory given.tb  Reproductive/Obstetrics negative OB ROS                            Anesthesia Physical Anesthesia Plan  ASA: III  Anesthesia Plan: General   Post-op Pain Management:    Induction: Intravenous  Airway Management Planned: Oral ETT  Additional Equipment:   Intra-op Plan:   Post-operative Plan: Extubation in OR  Informed Consent: I have reviewed the patients History and Physical, chart, labs and discussed the procedure including the risks, benefits and alternatives for the proposed anesthesia with the patient or authorized representative who has indicated his/her understanding and acceptance.   Dental advisory given  Plan Discussed with: CRNA,  Anesthesiologist and Surgeon  Anesthesia Plan Comments:        Anesthesia Quick Evaluation

## 2015-11-23 NOTE — Op Note (Signed)
Date of surgery: 11/23/2015 Preoperative diagnosis: Spondylolisthesis L4-L5 and L5-S1 with  stenosis and bilateral foot drops Postoperative diagnosis: Spondylolisthesis L4-5 and L5-S1 with stenosis and bilateral foot drops Procedure: L4 and L5 laminectomy decompression of L4-L5 and S1 nerve roots with more work than required for simple interbody fusion. Posterior lumbar interbody arthrodesis L4-5 and L5-S1 with peek spacers local autograft and allograft. Segmental fixation L4 to sacrum with pedicle screws, posterior lateral arthrodesis L4 to sacrum with local autograft and allograft. Surgeon: Kristeen Miss First assistant: Erline Levine M.D. Anesthesia: Gen. endotracheal Indications: Karen Reynolds is a 65 year old individual who is had back pain and leg pain but has developed significant weakness with a foot drop initially on the right side rapidly progressing to the left side over the past few weeks time. She has a degenerative spondylolisthesis and stenosis particularly for the L5 nerve root bilaterally. She's been advised regarding the need for surgical decompression and stabilization from L4 to sacrum, and is now taken to the operating room for this procedure.  Procedure: The patient was brought to the operating room supine on a stretcher. After the smooth induction of general endotracheal anesthesia she was carefully turned prone onto the operating table with the bony prominences being appropriately padded and protected. The back was prepped with alcohol and DuraPrep and draped in a sterile fashion. A linear incision was made in the lower lumbar spine to her previously made scar from previous surgery. The dissection was carried to the lumbar dorsal fascia. The fascia was opened on either side of the midline to expose the lower spinous processes of L4 and L5. A localizing radiograph identified our hooks on the spinous processes of L5 and L4. There is noted to be very little motion at the L5-S1 segment.  Laminotomies were then created by removing the inferior margin lamina L4 completely out to and including the entirety of the facet between L4 and L5. A portion of the spinous process was allowed to remain attached through the pars to the pedicle at sites at the L4 vertebrae. Then a complete laminectomy of L5 was performed removing the entirety of the lamina through the pars including the entirety of the inferior facet at the L5-S1 joint on either side. On exploring the disc spacers noted be a bony bridge across the disc space in little motion could be instilled however was a small opening noted on the left side and a dysphagia was entered and after using a series of disc shavers and dilators were able to perform discectomy at the L5-S1 space and open up the space to allow placement of interbody fusion cages. A complete discectomy was then performed at L4-5 also there is substantially much more disc material at the L4-5 level that could be evacuated. Care was taken to protect and decompress individually the L4 nerve roots the L5 nerve roots and the S1 nerve roots. Of these otitis were the L5 nerve roots and the L5 nerve root was noted to have a long path coming separate from the common dural tube above the L4-5 disc space and traveling into the lateral recess which was severely stenotic on both sides. The lateral recesses were well decompressed. The L4 nerve roots were similarly  decompressed into the extraforaminal zone. The S1 nerve roots could ultimately be mobilized towards the midline and this facilitated the performance of the discectomy and interbody fusion at the L5-S1 space. After the discectomies were completed at L4-5 a Zyston lordotic cage measuring 12 mm in height 25 mm  in length 10 mm in width with a degrees of lordosis was placed bilaterally. At L5-S1 Zyston lordotic 8 mm tall 25 mm long 10 mm wide with a degrees lordosis cage was placed bilaterally. At L5-S1 only 3 mL of additional autograft and  allograft was able to be packed into the interspace. At L4-L5 a total of 9 mL was able to be packed into the interspace along with the cages. Lateral gutters were then decorticated. Accommodation of autograft and allograft which was 10 mL of equiva bone was mixed with the autologous bone that was harvested from the laminectomies and facetectomies. This allowed a total of 9 mL of bone to be packed into each lateral gutter from L4 to the sacrum.  Quantity sites were then chosen at L4-L5 and the sacrum and these were individually drilled and tapped and then the screws placed with fluoroscopic verification. 45 mm precontoured rods were then placed between the screw heads in a neutral construct. These were tightened to the appropriate torque. Final radiographs were obtained. A little additional bone was packed into the lateral gutters after this. Then hemostasis was checked and the common dural tube takeoffs of the L4 and the L5 and the S1 nerve roots were each inspected carefully to make sure there adequately decompressed when this was verified the retractors were removed lumbar dorsal fascia was closed with #1 Vicryls the subcutaneous tissues were irrigated then 20 mL of half percent Marcaine was injected into the lumbar dorsal fascia and then 1 g of vancomycin powder was rubbed into the subcutaneous tissues in the subcutaneous tissues were closed with 20 Vicryls and 30 Vicryls subcuticularly. Dermabond was placed on the skin. Total blood loss for the procedure was estimated at 300 mL. No Cell Saver blood was returned to the patient.

## 2015-11-23 NOTE — H&P (Signed)
Karen Reynolds is an 65 y.o. female.   Chief Complaint: Back and bilateral leg pain with weakness in tibialis anterior HPI: Patient is a 65 year old individual who's had significant progression of pain and weakness in lower extremities initially starting with a right-sided foot drop rapidly progressing to left-sided footdrop the past weeks time. MRIs have demonstrated the patient has a spondylolisthesis at L4-5 in addition to severe spondylosis and stenosis at L5-S1. She particularly has stenosis for the L5 nerve roots. She had been seen by Dr. Hal Neer approximately 2 weeks ago and he requested I evaluate her and in the interim had progressed with her foot drop to the opposite side. She is now being admitted to undergo surgical decompression at L4-5 and L5-S1 with fusion of those 2 segments.  Past Medical History  Diagnosis Date  . Complication of anesthesia   . PONV (postoperative nausea and vomiting)   . Diabetes mellitus without complication ([place])   . Arthritis     degenerative joint disease, lumbar region degeneration    . Cancer ([place]) 1989    cervical ca  . Foot drop, bilateral   . Gout     Past Surgical History  Procedure Laterality Date  . Joint replacement  3 &03/2015    bilateral   . Cholecystectomy    . Abdominal hysterectomy    . Back surgery  1980    lumbar- L5-S1- laminectomy   . Stapedes surgery Bilateral   . Arm surger Left 2013    plate & radial head done in [facility] x4 surgeries   . Cesarean section  1988  . Fracture surgery Left     x4    History reviewed. No pertinent family history. Social History:  reports that she has never smoked. She does not have any smokeless tobacco history on file. She reports that she does not drink alcohol or use illicit drugs.  Allergies:  Allergies  Allergen Reactions  . Metolazone Other (See Comments)    Severe weight loss    Medications Prior to Admission  Medication Sig Dispense Refill  . allopurinol (ZYLOPRIM) 300  MG tablet Take 300 mg by mouth daily.  3  . amLODipine (NORVASC) 10 MG tablet Take 10 mg by mouth daily.  3  . aspirin EC 81 MG tablet Take 81 mg by mouth daily.     Marland Kitchen atorvastatin (LIPITOR) 10 MG tablet Take 10 mg by mouth daily.  3  . hydrochlorothiazide (HYDRODIURIL) 25 MG tablet Take 25 mg by mouth daily.  3  . ibuprofen (ADVIL,MOTRIN) 200 MG tablet Take 800 mg by mouth daily as needed for moderate pain.     . Liniments (SALONPAS PAIN RELIEF PATCH) PADS Apply 1 patch topically daily as needed (for pain).     . meloxicam (MOBIC) 15 MG tablet Take 15 mg by mouth daily before breakfast.   3  . metFORMIN (GLUCOPHAGE) 1000 MG tablet Take 1,000 mg by mouth 2 (two) times daily.  3  . metoprolol (LOPRESSOR) 100 MG tablet Take 50 mg by mouth 2 (two) times daily.  3  . omeprazole (PRILOSEC) 40 MG capsule Take 40 mg by mouth daily.  3  . valACYclovir (VALTREX) 1000 MG tablet Take 1,000 mg by mouth daily as needed (for fever blister flare ups).       Results for orders placed or performed during the hospital encounter of 11/23/15 (from the past 48 hour(s))  Glucose, capillary     Status: Abnormal   Collection Time: 11/23/15  10:16 AM  Result Value Ref Range   Glucose-Capillary 128 (H) 65 - 99 mg/dL   No results found.  Review of Systems  HENT: Negative.   Eyes: Negative.   Respiratory: Negative.   Gastrointestinal: Negative.   Genitourinary: Negative.   Musculoskeletal: Positive for back pain.  Skin: Negative.   Neurological: Positive for weakness.       Numbness and weakness in both lower extremities of recent onset. Foot drops  Psychiatric/Behavioral: Negative.     Blood pressure 150/73, pulse 72, temperature 98 F (36.7 C), temperature source Oral, resp. rate 20, height 5\' 5"  (1.651 m), weight 101.799 kg (224 lb 6.8 oz), SpO2 96 %. Physical Exam  Constitutional: She is oriented to person, place, and time. She appears well-developed and well-nourished.  HENT:  Head: Normocephalic and  atraumatic.  Neck: Normal range of motion. Neck supple.  Cardiovascular: Normal rate and regular rhythm.   Respiratory: Effort normal and breath sounds normal.  GI: Soft. Bowel sounds are normal.  Musculoskeletal:  0 straight leg raising at 15 in either lower extremity. Patrick's maneuver is negative bilaterally. Palpation and percussion of lumbar spine reproduces localized central pain  Neurological: She is alert and oriented to person, place, and time. She has normal reflexes.  Patient has significant weakness in tibialis anterior with left sidebending trace at 1 out of 5 and extensor hallucis longus and the right side being completely out  Skin: Skin is warm and dry.  Psychiatric: She has a normal mood and affect. Her behavior is normal. Judgment and thought content normal.     Assessment/Plan Lumbar spondylolisthesis at L4-5 and L5-S1. Lumbar radiculopathy with bilateral foot drops.  Decompression fusion L4-5 and L5-S1.  Avilene Marrin J 11/23/2015, 10:54 AM

## 2015-11-24 LAB — GLUCOSE, CAPILLARY
GLUCOSE-CAPILLARY: 164 mg/dL — AB (ref 65–99)
Glucose-Capillary: 157 mg/dL — ABNORMAL HIGH (ref 65–99)
Glucose-Capillary: 117 mg/dL — ABNORMAL HIGH (ref 65–99)

## 2015-11-24 LAB — CBC
HEMATOCRIT: 30.2 % — AB (ref 36.0–46.0)
HEMOGLOBIN: 10.1 g/dL — AB (ref 12.0–15.0)
MCH: 30.2 pg (ref 26.0–34.0)
MCHC: 33.4 g/dL (ref 30.0–36.0)
MCV: 90.4 fL (ref 78.0–100.0)
Platelets: 183 10*3/uL (ref 150–400)
RBC: 3.34 MIL/uL — AB (ref 3.87–5.11)
RDW: 14.3 % (ref 11.5–15.5)
WBC: 9.5 10*3/uL (ref 4.0–10.5)

## 2015-11-24 LAB — BASIC METABOLIC PANEL
ANION GAP: 12 (ref 5–15)
BUN: 21 mg/dL — ABNORMAL HIGH (ref 6–20)
CO2: 26 mmol/L (ref 22–32)
Calcium: 8.2 mg/dL — ABNORMAL LOW (ref 8.9–10.3)
Chloride: 102 mmol/L (ref 101–111)
Creatinine, Ser: 1.32 mg/dL — ABNORMAL HIGH (ref 0.44–1.00)
GFR calc Af Amer: 48 mL/min — ABNORMAL LOW (ref >60)
GFR calc non Af Amer: 41 mL/min — ABNORMAL LOW (ref >60)
GLUCOSE: 192 mg/dL — AB (ref 65–99)
POTASSIUM: 4.9 mmol/L (ref 3.5–5.1)
Sodium: 140 mmol/L (ref 135–145)

## 2015-11-24 MED ORDER — DEXAMETHASONE 2 MG PO TABS
2.0000 mg | ORAL_TABLET | Freq: Two times a day (BID) | ORAL | Status: DC
  Administered 2015-11-24 – 2015-11-25 (×4): 2 mg via ORAL
  Filled 2015-11-24 (×3): qty 1

## 2015-11-24 MED FILL — Sodium Chloride IV Soln 0.9%: INTRAVENOUS | Qty: 1000 | Status: AC

## 2015-11-24 MED FILL — Heparin Sodium (Porcine) Inj 1000 Unit/ML: INTRAMUSCULAR | Qty: 30 | Status: AC

## 2015-11-24 NOTE — Evaluation (Signed)
Occupational Therapy Evaluation Patient Details Name: Karen Reynolds MRN: [id number] DOB: 24-Nov-1950 Today's Date: 11/24/2015    History of Present Illness Pt is a 65 y.o. female who presents s/p L4-S1 on 11/23/15.  PMH includes bilateral foot drop, cancer, PONV, back surgery, surgeries on LUE, and arthritis, gout, and knee surgery.   Clinical Impression   Pt s/p above. Pt getting assist with ADLs, PTA. Feel pt will benefit from acute OT to increase independence prior to d/c. Recommending CIR consult.    Follow Up Recommendations  CIR    Equipment Recommendations  Other (comment) (defer to next venue)    Recommendations for Other Services Rehab consult     Precautions / Restrictions Precautions Precautions: Fall;Back Precaution Booklet Issued: No Precaution Comments: reviewed back precautions Required Braces or Orthoses: Spinal Brace Spinal Brace:  (already on in session) Restrictions Weight Bearing Restrictions: No      Mobility Bed Mobility Overal bed mobility: Needs Assistance Bed Mobility: Sit to Sidelying;Rolling Rolling: Supervision     Sit to sidelying: Mod assist General bed mobility comments: assist with bilateral LEs to go to sidelying position.  Transfers Overall transfer level: Needs assistance Equipment used: Rolling walker (2 wheeled) Transfers: Sit to/from Stand Sit to Stand: Min assist        Balance Tried walking without RW and pt requiring heavy assist from OT. Min guard taking steps with RW.                         ADL Overall ADL's : Needs assistance/impaired Eating/Feeding: Independent               Upper Body Dressing : Set up;Supervision/safety;Sitting Upper Body Dressing Details (indicate cue type and reason): doffed back brace in session with no physical assist needed Lower Body Dressing: Maximal assistance;Sit to/from stand   Toilet Transfer: Ambulation;RW;Minimal assistance (sit to stand from chair-Min assist;Min  guard walking with RW) Toilet Transfer Details (indicate cue type and reason): Attempted to take a few steps without RW but required Mod-Max A.         Functional mobility during ADLs: Rolling walker (Min guard with RW; Mod-Max without RW-few steps) General ADL Comments: Educated on back brace. Educated on AE including what pt could use for toilet aid if needed. Pt reports she doesn't wear socks.     Vision     Perception     Praxis      Pertinent Vitals/Pain Pain Assessment: 0-10 Pain Score:  (7-8) Pain Location: back, RLE, LLE Pain Descriptors / Indicators: Shooting;Other (Comment) (Tired) Pain Intervention(s): Monitored during session;Repositioned     Hand Dominance Right   Extremity/Trunk Assessment Upper Extremity Assessment Upper Extremity Assessment: LUE deficits/detail LUE Deficits / Details: decreased elbow extension ROM due to previous injury LUE Sensation: decreased light touch (in last two digits)   Lower Extremity Assessment Lower Extremity Assessment: Defer to PT evaluation RLE Deficits / Details: Significant foot drop. Pt has an AFO for that foot.  LLE Deficits / Details: Less extensive foot drop. Pt has something that attaches to her shoe (not an AFO) that assists with dorsiflexion.    Cervical / Trunk Assessment Cervical / Trunk Assessment: Normal   Communication Communication Communication: No difficulties   Cognition Arousal/Alertness: Awake/alert Behavior During Therapy: WFL for tasks assessed/performed Overall Cognitive Status: Within Functional Limits for tasks assessed                     General  Comments       Exercises       Shoulder Instructions      Home Living Family/patient expects to be discharged to:: Private residence Living Arrangements: Spouse/significant other Available Help at Discharge: Family;Available 24 hours/day Type of Home: House Home Access: Stairs to enter [employer] of Steps: 1   Home  Layout: Multi-level;1/2 bath on main level;Able to live on main level with bedroom/bathroom Alternate Level Stairs-Number of Steps: 2 stairs to get to kitchen - i rail. 14 to get to second floor bedroom bilateral rails. Flight to baselemt which she does not go to.   Bathroom Shower/Tub: Occupational psychologist: Standard Bathroom Accessibility: Yes   Home Equipment: Toilet riser;Bedside commode;Shower seat;Cane - single point;Walker - 2 wheels;Walker - 4 wheels;Adaptive equipment Adaptive Equipment: Reacher;Long-handled shoe horn;Long-handled sponge        Prior Functioning/Environment Level of Independence: Needs assistance  Gait / Transfers Assistance Needed: Rollator in community. SPC or RW in the house.` ADL's / Homemaking Assistance Needed: assist with shower transfer and bathing when pt was not going upstairs. Assist with dressing.        OT Diagnosis: Acute pain;Generalized weakness   OT Problem List: Decreased strength;Decreased range of motion;Obesity;Pain;Decreased knowledge of precautions;Decreased knowledge of use of DME or AE;Impaired balance (sitting and/or standing);Decreased activity tolerance;Impaired sensation   OT Treatment/Interventions: Self-care/ADL training;DME and/or AE instruction;Therapeutic activities;Balance training;Patient/family education    OT Goals(Current goals can be found in the care plan section) Acute Rehab OT Goals Patient Stated Goal: not stated OT Goal Formulation: With patient Time For Goal Achievement: 12/01/15 Potential to Achieve Goals: Good  OT Frequency: Min 2X/week   Barriers to D/C:            Co-evaluation              End of Session Equipment Utilized During Treatment: Rolling walker;Gait belt;Back brace  Activity Tolerance: Patient tolerated treatment well Patient left: in bed;with call bell/phone within reach; SCDs applied   Time: [id number] OT Time Calculation (min): 20 min Charges:  OT General  Charges $OT Visit: 1 Procedure OT Evaluation $OT Eval Moderate Complexity: 1 Procedure G-CodesBenito Mccreedy OTR/L [id number] 11/24/2015, 11:50 AM

## 2015-11-24 NOTE — Progress Notes (Signed)
Rehab Admissions Coordinator Note:  Patient was screened by Retta Diones for appropriateness for an Inpatient Acute Rehab Consult.  At this time, we are recommending HH or SNF if patient/family prefer.  Patient lacks the medical necessity to support an inpatient rehab admission.  It is doubtful that we could get authorization from insurance carrier for inpatient rehab admission.  Call me for questions.    Retta Diones 11/24/2015, 1:33 PM  I can be reached at [phone].

## 2015-11-24 NOTE — Progress Notes (Signed)
Patient ID: Karen Reynolds, female   DOB: June 07, 1951, 65 y.o.   MRN: [id number] Vital signs are stable Motor function is about the same Trace tibialis anterior function on left Right footdrop remains unchanged Dressing is clean and dry Start mobilization today Foley catheter is out Laboratory studies show hematocrit of 30 Slight increase in creatinine Will stop Toradol

## 2015-11-24 NOTE — Anesthesia Postprocedure Evaluation (Signed)
Anesthesia Post Note  Patient: Karen Reynolds  Procedure(s) Performed: Procedure(s) (LRB): Lumbar four-five, Lumbar five-Sacral one Posterior lumbar interbody fusion (N/A)  Patient location during evaluation: PACU Anesthesia Type: General Level of consciousness: awake and alert Pain management: pain level controlled Vital Signs Assessment: post-procedure vital signs reviewed and stable Respiratory status: spontaneous breathing, nonlabored ventilation, respiratory function stable and patient connected to nasal cannula oxygen Cardiovascular status: blood pressure returned to baseline and stable Postop Assessment: no signs of nausea or vomiting Anesthetic complications: no    Last Vitals:  Filed Vitals:   11/24/15 0248 11/24/15 0429  BP: 118/69 116/57  Pulse: 78 77  Temp: 36.4 C 36.4 C  Resp: 17 18    Last Pain:  Filed Vitals:   11/24/15 0541  PainSc: 5                  Catalina Gravel

## 2015-11-24 NOTE — Progress Notes (Signed)
Orthopedic Tech Progress Note Patient Details:  RALPHINE CAMMER 1951/05/09 [id number]  Patient ID: Carl Best, female   DOB: 1950/12/09, 65 y.o.   MRN: [id number] Called in bio-tech brace order; spoke with Dolores Lory, Niquan Charnley 11/24/2015, 9:40 AM

## 2015-11-24 NOTE — Progress Notes (Signed)
Utilization review completed.  

## 2015-11-24 NOTE — Progress Notes (Signed)
Foley removed. Patient due to void by 1230. Patient stood at the side of the bed with minimal assist and a walker. Doing well this morning. Iverson Sees, Rande Brunt, RN

## 2015-11-24 NOTE — Evaluation (Signed)
Physical Therapy Evaluation Patient Details Name: Karen Reynolds MRN: [id number] DOB: [birth year] Today's Date: 11/24/2015   History of Present Illness  Pt is a 65 y/o female who presents s/p L4-S1 on 11/23/15.   Clinical Impression  Pt admitted with above diagnosis. Pt currently with functional limitations due to the deficits listed below (see PT Problem List). At the time of PT eval, brace had not been delivered. Per RN, pt able to get to recliner without brace donned however ambulation was limited for safety. Pt currently functioning at a min assist level for transfers. Pt has AFO for R foot and attachment for L shoe to aide with foot drop which pt reports is unchanged since the surgery.   Pt will benefit from skilled PT to increase their independence and safety with mobility to allow discharge to the venue listed below. Feel this patient would benefit from continued rehab at the CIR level to maximize independence and function prior to return home with husband.     Follow Up Recommendations CIR    Equipment Recommendations  None recommended by PT    Recommendations for Other Services Rehab consult     Precautions / Restrictions Precautions Precautions: Fall;Back Precaution Booklet Issued: No Precaution Comments: Reviewed back precautions initially and reinforced during functional mobility.  Required Braces or Orthoses: Spinal Brace Spinal Brace: Applied in sitting position Restrictions Weight Bearing Restrictions: No      Mobility  Bed Mobility Overal bed mobility: Needs Assistance Bed Mobility: Rolling;Sidelying to Sit Rolling: Supervision Sidelying to sit: Min guard       General bed mobility comments: Use of rails required. HOB lowered to simulate home environment. Pt was able to transition to EOB and scoot herself all the way out without physical assistance.   Transfers Overall transfer level: Needs assistance Equipment used: Rolling walker (2 wheeled) Transfers: Sit  to/from [facility] Sit to Stand: Min assist Stand pivot transfers: Min assist       General transfer comment: Steady assist as pt powered-up to full standing position and took pivotal steps around to the [facility] Decreased floor clearance due to foot drop. Pt required increased cues for upright posture, maintenance of back precautions, and hand placement on seated surface for safety.   Ambulation/Gait Ambulation/Gait assistance: Min assist Ambulation Distance (Feet): 3 Feet Assistive device: Rolling walker (2 wheeled) Gait Pattern/deviations: Step-through pattern;Decreased stride length Gait velocity: Decreased Gait velocity interpretation: Below normal speed for age/gender General Gait Details: Pt ambulated ~3 feet from [facility] to recliner. Ambulation limited as brace has not been delivered. Per RN, OK to get to chair without brace donned.   Stairs            Wheelchair Mobility    Modified Rankin (Stroke Patients Only)       Balance Overall balance assessment: Needs assistance Sitting-balance support: Feet supported;No upper extremity supported Sitting balance-Leahy Scale: Fair     Standing balance support: Bilateral upper extremity supported;During functional activity Standing balance-Leahy Scale: Poor Standing balance comment: Requires UE support to maintain precautions.                              Pertinent Vitals/Pain Pain Assessment: 0-10 Pain Score: 4  Pain Location: Incision Pain Descriptors / Indicators: Operative site guarding Pain Intervention(s): Limited activity within patient's tolerance;Monitored during session;Repositioned    Home Living Family/patient expects to be discharged to:: Private residence Living Arrangements: Spouse/significant other Available Help at Discharge:  Family;Available 24 hours/day Type of Home: House Home Access: Stairs to enter   [employer] of Steps: 1 Home Layout: Multi-level;1/2 bath  on main level;Able to live on main level with bedroom/bathroom Home Equipment: Toilet riser;Bedside commode;Shower seat;Cane - single point;Walker - 2 wheels;Walker - 4 wheels      Prior Function Level of Independence: Needs assistance   Gait / Transfers Assistance Needed: Rollator in community. SPC or RW in the house.`  ADL's / Homemaking Assistance Needed: Husband assisted with set up and getting in the shower but pt was able to complete all bathing on her own. At times could not get up to second story and sponge bathed downstairs. Last few months needed assist with LB dressing        Hand Dominance   Dominant Hand: Right    Extremity/Trunk Assessment   Upper Extremity Assessment: LUE deficits/detail       LUE Deficits / Details: Crush injury 2013 radial plate. Pt states she has difficulty using that arm (cannot reach both railings on stairs at the same time due to decreased ROM)   Lower Extremity Assessment: RLE deficits/detail;LLE deficits/detail RLE Deficits / Details: Significant foot drop. Pt has an AFO for that foot.  LLE Deficits / Details: Less extensive foot drop. Pt has something that attaches to her shoe (not an AFO) that assists with dorsiflexion.   Cervical / Trunk Assessment: Normal  Communication   Communication: No difficulties  Cognition Arousal/Alertness: Awake/alert Behavior During Therapy: WFL for tasks assessed/performed Overall Cognitive Status: Within Functional Limits for tasks assessed                      General Comments      Exercises        Assessment/Plan    PT Assessment Patient needs continued PT services  PT Diagnosis Difficulty walking;Acute pain   PT Problem List Decreased strength;Decreased range of motion;Decreased activity tolerance;Decreased balance;Decreased mobility;Decreased knowledge of use of DME;Decreased safety awareness;Decreased knowledge of precautions;Pain  PT Treatment Interventions DME instruction;Gait  training;Stair training;Functional mobility training;Therapeutic activities;Therapeutic exercise;Neuromuscular re-education;Patient/family education   PT Goals (Current goals can be found in the Care Plan section) Acute Rehab PT Goals Patient Stated Goal: Get rid of drop foot PT Goal Formulation: With patient Time For Goal Achievement: 12/08/15 Potential to Achieve Goals: Good    Frequency Min 5X/week   Barriers to discharge        Co-evaluation               End of Session Equipment Utilized During Treatment:  (Brace not delivered yet - per RN OK to get to chair) Activity Tolerance: Patient tolerated treatment well Patient left: in chair;with call bell/phone within reach Nurse Communication: Mobility status         Time: [id number] PT Time Calculation (min) (ACUTE ONLY): 35 min   Charges:   PT Evaluation $PT Eval Moderate Complexity: 1 Procedure PT Treatments $Gait Training: 8-22 mins   PT G Codes:        Rolinda Roan 03-Dec-2015, 10:40 AM   Rolinda Roan, PT, DPT Acute Rehabilitation Services Pager: [phone]

## 2015-11-25 LAB — GLUCOSE, CAPILLARY
GLUCOSE-CAPILLARY: 128 mg/dL — AB (ref 65–99)
Glucose-Capillary: 152 mg/dL — ABNORMAL HIGH (ref 65–99)
Glucose-Capillary: 159 mg/dL — ABNORMAL HIGH (ref 65–99)
Glucose-Capillary: 134 mg/dL — ABNORMAL HIGH (ref 65–99)

## 2015-11-25 NOTE — Progress Notes (Addendum)
Physical Therapy Treatment Patient Details Name: Karen Reynolds MRN: [id number] DOB: 1951-06-16 Today's Date: 11/25/2015    History of Present Illness Pt is a 65 y.o. female who presents s/p L4-S1 on 11/23/15.  PMH includes bilateral foot drop, cancer, PONV, back surgery, surgeries on LUE, and arthritis, gout, and knee surgery.    PT Comments    Pt progressing towards physical therapy goals. Had one instance of knee buckling/legs giving out in which staff urgently brought a chair for pt to sit to prevent a fall. Will need +2 assist and knee brace donned in supine prior to attempting ambulation next session. Will continue to follow and progress as able per POC.   Follow Up Recommendations  SNF;Supervision/Assistance - 24 hour     Equipment Recommendations  None recommended by PT    Recommendations for Other Services       Precautions / Restrictions Precautions Precautions: Fall;Back Precaution Booklet Issued: No Precaution Comments: reviewed back precautions Required Braces or Orthoses: Spinal Brace Spinal Brace: Applied in sitting position Restrictions Weight Bearing Restrictions: No    Mobility  Bed Mobility Overal bed mobility: Needs Assistance Bed Mobility: Rolling;Sidelying to Sit Rolling: Supervision Sidelying to sit: Min guard       General bed mobility comments: Pt was able to transition to EOB without assistance. Min use of rails required.   Transfers Overall transfer level: Needs assistance Equipment used: Rolling walker (2 wheeled) Transfers: Sit to/from Stand Sit to Stand: Min assist Stand pivot transfers: Min assist       General transfer comment: Steadying assist as pt powered-up to full standing and took pivotal steps around to the [facility]. Light min assist to stand from [facility].   Ambulation/Gait Ambulation/Gait assistance: Max assist;+2 safety/equipment Ambulation Distance (Feet): 20 Feet Assistive device: Rolling walker (2 wheeled) Gait  Pattern/deviations: Step-through pattern;Decreased stride length;Trunk flexed Gait velocity: Decreased Gait velocity interpretation: Below normal speed for age/gender General Gait Details: Pt with AFO on R and ankle support on L donned. Was able to ambulate out to the hallway however once turned around to walk back to room, pt felt her legs were giving out on her. Knees flexed and pt began to sink down, supporting herself mostly with forearms on the walker. Staff urgently brought chair for pt to sit, and therapist provided max assist to prevent fall.    Stairs            Wheelchair Mobility    Modified Rankin (Stroke Patients Only)       Balance Overall balance assessment: Needs assistance Sitting-balance support: Feet supported;No upper extremity supported Sitting balance-Leahy Scale: Fair     Standing balance support: No upper extremity supported;During functional activity Standing balance-Leahy Scale: Zero Standing balance comment: Requires UE support and assist from therapist to maintain standing balance at times.                     Cognition Arousal/Alertness: Awake/alert Behavior During Therapy: WFL for tasks assessed/performed Overall Cognitive Status: Within Functional Limits for tasks assessed                      Exercises      General Comments        Pertinent Vitals/Pain Pain Assessment: Faces Faces Pain Scale: Hurts little more Pain Location: Incision site Pain Descriptors / Indicators: Operative site guarding;Discomfort Pain Intervention(s): Limited activity within patient's tolerance;Monitored during session;Repositioned    Home Living  Prior Function            PT Goals (current goals can now be found in the care plan section) Acute Rehab PT Goals Patient Stated Goal: not stated PT Goal Formulation: With patient Time For Goal Achievement: 12/08/15 Potential to Achieve Goals: Good Progress  towards PT goals: Progressing toward goals    Frequency  Min 5X/week    PT Plan Current plan remains appropriate    Co-evaluation             End of Session Equipment Utilized During Treatment: Back brace Activity Tolerance: Patient tolerated treatment well Patient left: in chair;with call bell/phone within reach     Time: 1115-1140 PT Time Calculation (min) (ACUTE ONLY): 25 min  Charges:  $Gait Training: 23-37 mins                    G Codes:      Rolinda Roan 2015/11/26, 2:25 PM   Rolinda Roan, PT, DPT Acute Rehabilitation Services Pager: [phone]

## 2015-11-25 NOTE — Progress Notes (Signed)
Patient ID: Karen Reynolds, female   DOB: 1950-12-13, 65 y.o.   MRN: [id number] Alert, oriented, comfortable Vital signs are stable Dressing clean and dry Rehabilitation suggest possibility of snf Continue to monitor today Possible discharge this weekend

## 2015-11-26 ENCOUNTER — Encounter (HOSPITAL_COMMUNITY): Payer: Self-pay | Admitting: Neurological Surgery

## 2015-11-26 LAB — GLUCOSE, CAPILLARY
GLUCOSE-CAPILLARY: 143 mg/dL — AB (ref 65–99)
Glucose-Capillary: 136 mg/dL — ABNORMAL HIGH (ref 65–99)
Glucose-Capillary: 138 mg/dL — ABNORMAL HIGH (ref 65–99)

## 2015-11-26 MED ORDER — OXYCODONE-ACETAMINOPHEN 5-325 MG PO TABS: 1.0000 | ORAL_TABLET | ORAL | Status: DC | PRN

## 2015-11-26 MED ORDER — DEXAMETHASONE 2 MG PO TABS
1.0000 mg | ORAL_TABLET | Freq: Two times a day (BID) | ORAL | Status: DC
  Administered 2015-11-26 – 2015-11-30 (×9): 1 mg via ORAL
  Filled 2015-11-26 (×9): qty 1

## 2015-11-26 MED ORDER — CEPHALEXIN 500 MG PO CAPS: 500.0000 mg | ORAL_CAPSULE | Freq: Four times a day (QID) | ORAL | Status: DC

## 2015-11-26 MED ORDER — METHOCARBAMOL 500 MG PO TABS: 500.0000 mg | ORAL_TABLET | Freq: Four times a day (QID) | ORAL | Status: DC | PRN

## 2015-11-26 MED ORDER — DEXAMETHASONE 1 MG PO TABS: ORAL_TABLET | ORAL | Status: DC

## 2015-11-26 MED ORDER — CEPHALEXIN 500 MG PO CAPS
500.0000 mg | ORAL_CAPSULE | Freq: Four times a day (QID) | ORAL | Status: DC
  Administered 2015-11-26 – 2015-12-01 (×21): 500 mg via ORAL
  Filled 2015-11-26 (×21): qty 1

## 2015-11-26 NOTE — Progress Notes (Signed)
Hourly rounding performed. Call light within reach. Pt in no acute distress. Denies needs. Pt states pain is better. 5/10.

## 2015-11-26 NOTE — Progress Notes (Signed)
Occupational Therapy Treatment Patient Details Name: Karen Reynolds MRN: [id number] DOB: May 26, 1951 Today's Date: 11/26/2015    History of present illness Pt is a 65 y.o. female who presents s/p L4-S1 on 11/23/15.  PMH includes bilateral foot drop, cancer, PONV, back surgery, surgeries on LUE, and arthritis, gout, and knee surgery.   OT comments  Pt requires min A for functional mobility and transfers. She fatigues quickly causing her to have to sit quickly, and at those times demonstrates poor compliance with back precautions and increased risk for falls. She will require mod - max A for LB ADLs at discharge due to multiple LE braces.  Recommend SNF at discharge as pt is at high risk for falls.  She lives with 55 y.o. Spouse.   Follow Up Recommendations  SNF    Equipment Recommendations  None recommended by OT    Recommendations for Other Services      Precautions / Restrictions Precautions Precautions: Fall;Back Precaution Comments: Pt able to state 3/3 back precautions, but requires min cues during functional activities to maintain precautions.   Required Braces or Orthoses: Spinal Brace;Other Brace/Splint Spinal Brace: Lumbar corset;Applied in sitting position Other Brace/Splint: Rt AFO, Lt foot drop device, and Rt knee brace        Mobility Bed Mobility Overal bed mobility: Needs Assistance Bed Mobility: Rolling;Sidelying to Sit Rolling: Supervision Sidelying to sit: Min guard       General bed mobility comments: heavy reliance on bedrail   Transfers Overall transfer level: Needs assistance Equipment used: Rolling walker (2 wheeled) Transfers: Sit to/from [facility] Sit to Stand: Min assist Stand pivot transfers: Min assist       General transfer comment: Requires assist to power up at times and for balance     Balance Overall balance assessment: Needs assistance Sitting-balance support: Feet supported Sitting balance-Leahy Scale: Good      Standing balance support: Bilateral upper extremity supported Standing balance-Leahy Scale: Poor                     ADL Overall ADL's : Needs assistance/impaired                     Lower Body Dressing: Maximal assistance;Sit to/from stand Lower Body Dressing Details (indicate cue type and reason): Pt requires max assist to don shoes.  SHe is able to cross ankle over knees, but is unable to access feet without bending.  She requires assist to don Lt foot brace, Rt AFO and Rt knee brace.  She reports spouse will assist her at home.  Doubt pt will reach mod I level with this even with use of AE due to braces.  Toilet Transfer: Minimal assistance;Ambulation;Comfort height toilet;BSC;Grab bars;RW Armed forces technical officer Details (indicate cue type and reason): Pt fatigues rapidly and sits quickly.  She requires mod cues for back precautions and walker safety  Toileting- Clothing Manipulation and Hygiene: Total assistance;Sit to/from stand Toileting - Clothing Manipulation Details (indicate cue type and reason): Discussed use of toileting aid with pt      Functional mobility during ADLs: Cane;Minimal assistance General ADL Comments: Pt with with dx DM, and typically does not wear socks.  Discussed recommendation to wear cotton, non restrictive socks.  Also had long discussion with her re: risk of developing decubitus on Rt foot due to fixed AFO (she purchased this on her own).  Encouraged her to discuss options with PT.  Recommended she only wear AFO when ambulating and  remove when seated to reduce risk of pressure wound.  Discussed with her the need to ask husband to inspect her feet every time she removes AFO/shoe.    Long discussion with pt re: safety at home, and best recommendation is for SNF.  Pt was tearful, but states she knows this is best, but she may choose to discharge home with 84 y.o. spouse       Vision                     Perception     Praxis      Cognition    Behavior During Therapy: WFL for tasks assessed/performed Overall Cognitive Status: Within Functional Limits for tasks assessed                       Extremity/Trunk Assessment               Exercises     Shoulder Instructions       General Comments      Pertinent Vitals/ Pain       Pain Assessment: Faces Faces Pain Scale: Hurts little more Pain Location: back  Pain Descriptors / Indicators: Aching;Constant;Grimacing Pain Intervention(s): Monitored during session;RN gave pain meds during session  Home Living                                          Prior Functioning/Environment              Frequency Min 2X/week     Progress Toward Goals  OT Goals(current goals can now be found in the care plan section)     ADL Goals Pt Will Perform Lower Body Dressing: with set-up;with supervision;with adaptive equipment;sit to/from stand Pt Will Transfer to Toilet: with supervision;with set-up;ambulating Pt Will Perform Toileting - Clothing Manipulation and hygiene: with supervision;sit to/from stand;with set-up Additional ADL Goal #1: Pt will independently verbalize 3/3 back precautions and maintain during session.   Plan Discharge plan needs to be updated    Co-evaluation                 End of Session Equipment Utilized During Treatment: Rolling walker;Gait belt;Back brace   Activity Tolerance Patient tolerated treatment well   Patient Left in bed;with call bell/phone within reach;with nursing/sitter in room   Nurse Communication Mobility status        Time: 1715-1810 OT Time Calculation (min): 55 min  Charges: OT General Charges $OT Visit: 1 Procedure OT Treatments $Self Care/Home Management : 53-67 mins  Karen Reynolds M 11/26/2015, 7:50 PM

## 2015-11-26 NOTE — Care Management Important Message (Signed)
Important Message  Patient Details  Name: Karen Reynolds MRN: [id number] Date of Birth: [birth year]   Medicare Important Message Given:  Yes    [place] [facility] [place] 11/26/2015, 4:11 PM

## 2015-11-26 NOTE — Discharge Summary (Signed)
Physician Discharge Summary  Patient ID: Karen Reynolds MRN: [id number] DOB/AGE: 1951-08-16 65 y.o.  Admit date: 11/23/2015 Discharge date: 11/27/2015  Admission Diagnoses: Spondylolisthesis L4-5 and L5-S1 with radiculopathy and bilateral foot drops  Discharge Diagnoses: Spondylolisthesis L4-5 and L5-S1 with radiculopathy and bilateral foot drops Active Problems:   Spondylolisthesis of lumbar region   Discharged Condition: good  Hospital Course: Karen Reynolds is a 65 year old individual who's had significant problems with back pain and she has developed bilateral foot drops. He is initially started in the right lower extremity but soon involve the left lower extremity and progressed fairly rapidly over several week period of time she is advised regarding the need for surgery. She tolerated surgery well and has been ambulating there is little reported change in the foot drop at this time.  Consults: None  Significant Diagnostic Studies: None  Treatments: surgery: Laminectomy and decompression of L4-5 and L5-S1 with posterior lumbar interbody arthrodesis segmental fixation from L4 to the sacrum and posterior lateral arthrodesis L4 to the sacrum.  Discharge Exam: Blood pressure 139/68, pulse 68, temperature 98.1 F (36.7 C), temperature source Oral, resp. rate 16, height 5\' 5"  (1.651 m), weight 101.799 kg (224 lb 6.8 oz), SpO2 95 %. Her incision has been clean but there has been some modest bleedthrough. She has had dressing changes with Betadine and dry gauze. Her motor function appears stable and she has a complete foot drop on the right side she has trace tibialis anterior and extensor hallucis longus function on the left side.  Disposition: Discharge home  Discharge Instructions    Call MD for:  redness, tenderness, or signs of infection (pain, swelling, redness, odor or green/yellow discharge around incision site)    Complete by:  As directed      Call MD for:  severe uncontrolled pain     Complete by:  As directed      Call MD for:  temperature >100.4    Complete by:  As directed      Diet - low sodium heart healthy    Complete by:  As directed      Discharge instructions    Complete by:  As directed   2 tablets twice daily for 2 days, one tablet twice daily for 2 days, one tablet daily for 2 days.     Increase activity slowly    Complete by:  As directed      Nursing communication    Complete by:  As directed   Send patient home with supply of 4x4 guaze and betadine swab sticks and tape to do several dressing changes.            Medication List    TAKE these medications        allopurinol 300 MG tablet  Commonly known as:  ZYLOPRIM  Take 300 mg by mouth daily.     amLODipine 10 MG tablet  Commonly known as:  NORVASC  Take 10 mg by mouth daily.     aspirin EC 81 MG tablet  Take 81 mg by mouth daily.     atorvastatin 10 MG tablet  Commonly known as:  LIPITOR  Take 10 mg by mouth daily.     dexamethasone 1 MG tablet  Commonly known as:  DECADRON  2 tablets twice daily for 2 days, one tablet twice daily for 2 days, one tablet daily for 2 days.     hydrochlorothiazide 25 MG tablet  Commonly known as:  HYDRODIURIL  Take 25  mg by mouth daily.     ibuprofen 200 MG tablet  Commonly known as:  ADVIL,MOTRIN  Take 800 mg by mouth daily as needed for moderate pain.     meloxicam 15 MG tablet  Commonly known as:  MOBIC  Take 15 mg by mouth daily before breakfast.     metFORMIN 1000 MG tablet  Commonly known as:  GLUCOPHAGE  Take 1,000 mg by mouth 2 (two) times daily.     methocarbamol 500 MG tablet  Commonly known as:  ROBAXIN  Take 1 tablet (500 mg total) by mouth every 6 (six) hours as needed for muscle spasms.     metoprolol 100 MG tablet  Commonly known as:  LOPRESSOR  Take 50 mg by mouth 2 (two) times daily.     omeprazole 40 MG capsule  Commonly known as:  PRILOSEC  Take 40 mg by mouth daily.     oxyCODONE-acetaminophen 5-325 MG tablet   Commonly known as:  PERCOCET/ROXICET  Take 1-2 tablets by mouth every 4 (four) hours as needed for moderate pain.     SALONPAS PAIN RELIEF PATCH Pads  Apply 1 patch topically daily as needed (for pain).     valACYclovir 1000 MG tablet  Commonly known as:  VALTREX  Take 1,000 mg by mouth daily as needed (for fever blister flare ups).         SignedEarleen Newport 11/26/2015, 5:25 PM

## 2015-11-26 NOTE — Progress Notes (Signed)
Pt requesting to use bedside commode, pt states she does not need to wear brace to go from bed to bedside commode. Pt urinated and now back in bed. Pain 5/10.

## 2015-11-26 NOTE — Care Management Note (Signed)
Case Management Note  Patient Details  Name: Karen Reynolds MRN: [id number] Date of Birth: [birth year]  Subjective/Objective:                    Action/Plan: Patient was admitted for a Lumbar four-five, Lumbar five-Sacral one Posterior lumbar interbody fusion.  Lives at home with wife. Will follow for discharge needs pending PT/OT evals and physician orders.  Expected Discharge Date:                  Expected Discharge Plan:     In-House Referral:     Discharge planning Services     Post Acute Care Choice:    Choice offered to:     DME Arranged:    DME Agency:     HH Arranged:    HH Agency:     Status of Service:  In process, will continue to follow  Medicare Important Message Given:    Date Medicare IM Given:    Medicare IM give by:    Date Additional Medicare IM Given:    Additional Medicare Important Message give by:     If discussed at [place] of Stay Meetings, dates discussed:    Additional Comments:  Rolm Baptise, RN 11/26/2015, 10:56 AM [phone]

## 2015-11-26 NOTE — Progress Notes (Signed)
Physical Therapy Treatment Patient Details Name: Karen Reynolds MRN: [id number] DOB: 18-Jun-1951 Today's Date: 11/26/2015    History of Present Illness Pt is a 65 y.o. female who presents s/p L4-S1 on 11/23/15.  PMH includes bilateral foot drop, cancer, PONV, back surgery, surgeries on LUE, and arthritis, gout, and knee surgery.    PT Comments    Pt progressing towards physical therapy goals. Was able to perform transfers and ambulation with +2 assist for safety this session. Chair follow utilized during gait training. Will explore the possibility of adding a LUE platform onto the walker as pt has difficulty using her L hand for support. Pt reports that she would like to return home at d/c, however continue to feel that SNF is the most appropriate d/c disposition from a PT standpoint. Will continue to follow and progress as able per POC.   Follow Up Recommendations  SNF;Supervision/Assistance - 24 hour     Equipment Recommendations  None recommended by PT    Recommendations for Other Services       Precautions / Restrictions Precautions Precautions: Fall;Back Precaution Booklet Issued: No Precaution Comments: reviewed back precautions during functional mobility Required Braces or Orthoses: Spinal Brace Spinal Brace: Lumbar corset;Applied in sitting position Restrictions Weight Bearing Restrictions: No    Mobility  Bed Mobility Overal bed mobility: Needs Assistance Bed Mobility: Rolling;Sidelying to Sit Rolling: Supervision Sidelying to sit: Min guard       General bed mobility comments: Pt was able to transition to EOB without assistance. Min use of rails required.   Transfers Overall transfer level: Needs assistance Equipment used: Rolling walker (2 wheeled) Transfers: Sit to/from Stand Sit to Stand: Min assist         General transfer comment: Steadying assist as pt powered-up to full standing position. Increased time to gain/maintain balance with the RW.    Ambulation/Gait Ambulation/Gait assistance: Min assist;+2 safety/equipment Ambulation Distance (Feet): 75 Feet Assistive device: Rolling walker (2 wheeled) Gait Pattern/deviations: Step-through pattern;Decreased stride length;Trunk flexed Gait velocity: Decreased Gait velocity interpretation: Below normal speed for age/gender General Gait Details: Pt with R knee brace, AFO on R and ankle support on L donned. Steadying assist required and chair follow utilized. Pt required seated rest once throughout gait training.    Stairs            Wheelchair Mobility    Modified Rankin (Stroke Patients Only)       Balance Overall balance assessment: Needs assistance Sitting-balance support: Feet supported;No upper extremity supported Sitting balance-Leahy Scale: Fair     Standing balance support: No upper extremity supported;During functional activity Standing balance-Leahy Scale: Poor Standing balance comment: Requires UE support                    Cognition Arousal/Alertness: Awake/alert Behavior During Therapy: WFL for tasks assessed/performed Overall Cognitive Status: Within Functional Limits for tasks assessed                      Exercises      General Comments        Pertinent Vitals/Pain Pain Assessment: Faces Faces Pain Scale: Hurts little more Pain Location: Incision, hips Pain Descriptors / Indicators: Operative site guarding;Aching Pain Intervention(s): Limited activity within patient's tolerance;Monitored during session;Repositioned    Home Living                      Prior Function  PT Goals (current goals can now be found in the care plan section) Acute Rehab PT Goals Patient Stated Goal: not stated PT Goal Formulation: With patient Time For Goal Achievement: 12/08/15 Potential to Achieve Goals: Good Progress towards PT goals: Progressing toward goals    Frequency  Min 5X/week    PT Plan Current plan  remains appropriate    Co-evaluation             End of Session Equipment Utilized During Treatment: Back brace;Gait belt;Other (comment) (Knee brace, AFO) Activity Tolerance: Patient tolerated treatment well Patient left: in chair;with call bell/phone within reach     Time: 1039-1102 PT Time Calculation (min) (ACUTE ONLY): 23 min  Charges:  $Gait Training: 23-37 mins                    G Codes:      Rolinda Roan Dec 12, 2015, 12:27 PM   Rolinda Roan, PT, DPT Acute Rehabilitation Services Pager: [phone]

## 2015-11-26 NOTE — Progress Notes (Signed)
Hourly rounding performed. Call light within reach. Pt in no acute distress. Denies needs. Pt rates pain 6/10, asked when she could receive more pain medication. Pt informed at 1305 pt could get more pain medication.

## 2015-11-26 NOTE — Progress Notes (Signed)
Patient ID: Karen Reynolds, female   DOB: 1951/08/05, 65 y.o.   MRN: [id number] Patient experiencing some buttock and hip pain on the left side. Her incision has some bleedthrough. Dressing has been removed. We will allow her to shower. Will start Keflex 500 mg every 6 hours.

## 2015-11-27 LAB — GLUCOSE, CAPILLARY
GLUCOSE-CAPILLARY: 138 mg/dL — AB (ref 65–99)
GLUCOSE-CAPILLARY: 106 mg/dL — AB (ref 65–99)
Glucose-Capillary: 129 mg/dL — ABNORMAL HIGH (ref 65–99)
Glucose-Capillary: 136 mg/dL — ABNORMAL HIGH (ref 65–99)

## 2015-11-27 NOTE — NC FL2 (Signed)
[place] LEVEL OF CARE SCREENING TOOL     IDENTIFICATION  Patient Name: Karen Reynolds Birthdate: May 19, 1951 Sex: female Admission Date (Current Location): 11/23/2015  [facility] and [place] Number:  Herbalist and Address:  [facility] [place], [place] [address] [place], [place] [address]      Provider Number: [id number]  Attending Physician Name and Address:  Kristeen Miss, MD  Relative Name and Phone Number:       Current Level of Care: Hospital Recommended Level of Care: [place] Prior Approval Number:    Date Approved/Denied: 11/27/15 PASRR Number: [id number]  Discharge Plan: SNF    Current Diagnoses: Patient Active Problem List   Diagnosis Date Noted  . Spondylolisthesis of lumbar region 11/23/2015    Orientation RESPIRATION BLADDER Height & Weight     Self, Time, Situation, Place  Normal Continent Weight: 224 lb 6.8 oz (101.799 kg) Height:  5\' 5"  (165.1 cm)  BEHAVIORAL SYMPTOMS/MOOD NEUROLOGICAL BOWEL NUTRITION STATUS      Continent Diet (low sodium heart healthy)  AMBULATORY STATUS COMMUNICATION OF NEEDS Skin   Extensive Assist Verbally Other (Comment) (incisions)                       Personal Care Assistance Level of Assistance  Bathing, Feeding, Dressing Bathing Assistance: Maximum assistance Feeding assistance: Independent Dressing Assistance: Maximum assistance     Functional Limitations Info  Sight, Hearing, Speech Sight Info: Adequate Hearing Info: Adequate Speech Info: Adequate    SPECIAL CARE FACTORS FREQUENCY  PT (By licensed PT), OT (By licensed OT)                    Contractures      Additional Factors Info  Code Status, Allergies Code Status Info: FULL Allergies Info: Metolazone           Current Medications (11/27/2015):  This is the current hospital active medication list Current Facility-Administered Medications  Medication Dose Route Frequency Provider  Last Rate Last Dose  . 0.9 %  sodium chloride infusion  250 mL Intravenous Continuous Kristeen Miss, MD   250 mL at 11/23/15 2207  . 0.9 %  sodium chloride infusion   Intravenous Continuous Kristeen Miss, MD 100 mL/hr at 11/23/15 2247    . acetaminophen (TYLENOL) tablet 650 mg  650 mg Oral Q4H PRN Kristeen Miss, MD       Or  . acetaminophen (TYLENOL) suppository 650 mg  650 mg Rectal Q4H PRN Kristeen Miss, MD      . allopurinol (ZYLOPRIM) tablet 300 mg  300 mg Oral Daily Kristeen Miss, MD   300 mg at 11/27/15 0936  . alum & mag hydroxide-simeth (MAALOX/MYLANTA) 200-200-20 MG/5ML suspension 30 mL  30 mL Oral Q6H PRN Kristeen Miss, MD      . amLODipine (NORVASC) tablet 10 mg  10 mg Oral Daily Kristeen Miss, MD   10 mg at 11/27/15 0936  . atorvastatin (LIPITOR) tablet 10 mg  10 mg Oral Daily Kristeen Miss, MD   10 mg at 11/27/15 0936  . bisacodyl (DULCOLAX) suppository 10 mg  10 mg Rectal Daily PRN Kristeen Miss, MD      . cephALEXin (KEFLEX) capsule 500 mg  500 mg Oral 4 times per day Kristeen Miss, MD   500 mg at 11/27/15 0600  . dexamethasone (DECADRON) tablet 1 mg  1 mg Oral Q12H Kristeen Miss, MD   1 mg at 11/27/15 0935  .  docusate sodium (COLACE) capsule 100 mg  100 mg Oral BID Kristeen Miss, MD   100 mg at 11/27/15 0936  . hydrochlorothiazide (HYDRODIURIL) tablet 25 mg  25 mg Oral Daily Kristeen Miss, MD   25 mg at 11/27/15 0936  . HYDROcodone-acetaminophen (NORCO/VICODIN) 5-325 MG per tablet 1-2 tablet  1-2 tablet Oral Q4H PRN Kristeen Miss, MD   2 tablet at 11/27/15 0310  . HYDROmorphone (DILAUDID) injection 0.5-1 mg  0.5-1 mg Intravenous Q2H PRN Kristeen Miss, MD   1 mg at 11/26/15 [phone]  . insulin aspart (novoLOG) injection 0-20 Units  0-20 Units Subcutaneous TID WC Kristeen Miss, MD   3 Units at 11/27/15 0804  . menthol-cetylpyridinium (CEPACOL) lozenge 3 mg  1 lozenge Oral PRN Kristeen Miss, MD   3 mg at 11/24/15 0507   Or  . phenol (CHLORASEPTIC) mouth spray 1 spray  1 spray Mouth/Throat PRN Kristeen Miss, MD      . metFORMIN (GLUCOPHAGE) tablet 1,000 mg  1,000 mg Oral BID WC Kristeen Miss, MD   1,000 mg at 11/27/15 0804  . methocarbamol (ROBAXIN) tablet 500 mg  500 mg Oral Q6H PRN Kristeen Miss, MD   500 mg at 11/27/15 0600   Or  . methocarbamol (ROBAXIN) 500 mg in dextrose 5 % 50 mL IVPB  500 mg Intravenous Q6H PRN Kristeen Miss, MD      . metoprolol (LOPRESSOR) tablet 50 mg  50 mg Oral BID Kristeen Miss, MD   50 mg at 11/27/15 0936  . ondansetron (ZOFRAN) injection 4 mg  4 mg Intravenous Q4H PRN Kristeen Miss, MD      . oxyCODONE-acetaminophen (PERCOCET/ROXICET) 5-325 MG per tablet 1-2 tablet  1-2 tablet Oral Q4H PRN Kristeen Miss, MD   1 tablet at 11/27/15 0749  . pantoprazole (PROTONIX) EC tablet 40 mg  40 mg Oral Daily Kristeen Miss, MD   40 mg at 11/27/15 0936  . polyethylene glycol (MIRALAX / GLYCOLAX) packet 17 g  17 g Oral Daily PRN Kristeen Miss, MD      . [facility]) tablet 8.6 mg  1 tablet Oral BID Kristeen Miss, MD   8.6 mg at 11/27/15 0936  . sodium chloride flush (NS) 0.9 % injection 3 mL  3 mL Intravenous Q12H Kristeen Miss, MD   3 mL at 11/27/15 0937  . sodium chloride flush (NS) 0.9 % injection 3 mL  3 mL Intravenous PRN Kristeen Miss, MD      . sodium phosphate (FLEET) 7-19 GM/118ML enema 1 enema  1 enema Rectal Once PRN Kristeen Miss, MD      . valACYclovir (VALTREX) tablet 1,000 mg  1,000 mg Oral Daily PRN Kristeen Miss, MD         Discharge Medications: Please see discharge summary for a list of discharge medications.  Relevant Imaging Results:  Relevant Lab Results:   Additional Information SS#: [id number]  Raymondo Band, LCSW

## 2015-11-27 NOTE — Progress Notes (Signed)
Per Rn report this AM if PT still rec'ed SNF, RN to d/c discharge order at this time and MD will evaluate tomorrow. Patient aware. SW already discussed with pt regarding placement.   Ave Filter, RN

## 2015-11-27 NOTE — Progress Notes (Signed)
Subjective: Patient reports doing OK  Objective: Vital signs in last 24 hours: Temp:  [98 F (36.7 C)-98.6 F (37 C)] 98.1 F (36.7 C) (02/04 0516) Pulse Rate:  [60-94] 60 (02/04 0516) Resp:  [16-20] 20 (02/04 0516) BP: (110-139)/(53-74) 129/71 mmHg (02/04 0516) SpO2:  [95 %-96 %] 95 % (02/04 0516)  Intake/Output from previous day: 02/03 0701 - 02/04 0700 In: 1080 [P.O.:1080] Out: 800 [Urine:800] Intake/Output this shift:    Physical Exam: Motor exam stable.    Lab Results: No results for input(s): WBC, HGB, HCT, PLT in the last 72 hours. BMET No results for input(s): NA, K, CL, CO2, GLUCOSE, BUN, CREATININE, CALCIUM in the last 72 hours.  Studies/Results: No results found.  Assessment/Plan: Discharge planning per Dr. Ellene Route.      LOS: 4 days    Peggyann Shoals, MD 11/27/2015, 7:07 AM

## 2015-11-27 NOTE — Progress Notes (Signed)
Physical Therapy Treatment Patient Details Name: Karen Reynolds MRN: [id number] DOB: [birth year] Today's Date: 11/27/2015    History of Present Illness Pt is a 65 y.o. female who presents s/p L4-S1 on 11/23/15.  PMH includes bilateral foot drop, cancer, PONV, back surgery, surgeries on LUE, and arthritis, gout, and knee surgery.    PT Comments    Patient progressing slowly towards PT goals. Continues to require assist for gait training and bed mobility. Total A to donn all braces, shoes and for peri-care. Pt is high fall risk and would really benefit from [facility] to maximize independence and mobility prior to return home. Pt reluctant but understands concerns. Willing to go to rehab for short time. Improved ambulation distance today and highly motivated to improve function. Will follow acutely.   Follow Up Recommendations  SNF;Supervision/Assistance - 24 hour     Equipment Recommendations  None recommended by PT    Recommendations for Other Services       Precautions / Restrictions Precautions Precautions: Fall;Back Precaution Booklet Issued: No Precaution Comments: Able to recall 2/3 back precautions. Required Braces or Orthoses: Spinal Brace;Other Brace/Splint Spinal Brace: Lumbar corset;Applied in sitting position Other Brace/Splint: Rt AFO, Lt foot drop device, and Rt knee brace  Restrictions Weight Bearing Restrictions: No    Mobility  Bed Mobility Overal bed mobility: Needs Assistance Bed Mobility: Rolling;Sidelying to Sit;Sit to Sidelying Rolling: Supervision Sidelying to sit: Min guard;HOB elevated     Sit to sidelying: Mod assist General bed mobility comments: Good demo of log roll technique. heavy use of rail. Assist to bring BLEs into bed.   Transfers Overall transfer level: Needs assistance Equipment used: Rolling walker (2 wheeled) Transfers: Sit to/from Stand Sit to Stand: Min guard         General transfer comment: Min guard for safety. Mutliple  attempts to stand from EOB however able to do it without physical assist but close min guard needed for steadying. Stood fromE OB x1, from chair x1, from [facility] x1.  Ambulation/Gait Ambulation/Gait assistance: Min assist Ambulation Distance (Feet): 60 Feet (+25') Assistive device: Rolling walker (2 wheeled) Gait Pattern/deviations: Step-through pattern;Decreased stride length;Trunk flexed Gait velocity: Decreased   General Gait Details: Pt with R knee brace, AFO on R and ankle support on L donned. Steadying assist required and chair follow utilized. 1 seated rest break due to fatigue.    Stairs            Wheelchair Mobility    Modified Rankin (Stroke Patients Only)       Balance Overall balance assessment: Needs assistance Sitting-balance support: Feet supported;No upper extremity supported Sitting balance-Leahy Scale: Good Sitting balance - Comments: Total A to donn braces and shoes.    Standing balance support: During functional activity Standing balance-Leahy Scale: Poor Standing balance comment: Reliant on RW for support. Total A for pericare.                     Cognition Arousal/Alertness: Awake/alert Behavior During Therapy: WFL for tasks assessed/performed Overall Cognitive Status: Within Functional Limits for tasks assessed                      Exercises      General Comments General comments (skin integrity, edema, etc.): Lengthy discussion re: disposition options, concern about returning home with husband, falls, activity progression etc.       Pertinent Vitals/Pain Pain Assessment: Faces Faces Pain Scale: Hurts a little bit Pain Location: back  Pain Descriptors / Indicators: Sore Pain Intervention(s): Monitored during session;Repositioned    Home Living                      Prior Function            PT Goals (current goals can now be found in the care plan section) Progress towards PT goals: Progressing toward goals     Frequency  Min 5X/week    PT Plan Current plan remains appropriate    Co-evaluation             End of Session Equipment Utilized During Treatment: Back brace;Gait belt;Other (comment) (knee brace, AFO) Activity Tolerance: Patient tolerated treatment well Patient left: in bed;with call bell/phone within reach;with bed alarm set     Time: 1004-1035 PT Time Calculation (min) (ACUTE ONLY): 31 min  Charges:  $Gait Training: 8-22 mins $Therapeutic Activity: 8-22 mins                    G Codes:      Karen Reynolds 11/27/2015, 11:56 AM  ,Karen Reynolds, [place], DPT [phone]

## 2015-11-27 NOTE — Clinical Social Work Note (Signed)
Clinical Social Work Assessment  Patient Details  Name: Karen Reynolds MRN: [id number] Date of Birth: 10-Jun-1951  Date of referral:  11/27/15               Reason for consult:  Facility Placement                Permission sought to share information with:  Case Manager, Family Supports Permission granted to share information::  Yes, Verbal Permission Granted  Name::     Christeen Douglas  Agency::     Relationship::  Husband  Contact Information:  [phone]  Housing/Transportation Living arrangements for the past 2 months:  [place] of Information:  Patient Patient Interpreter Needed:  None Criminal Activity/Legal Involvement Pertinent to Current Situation/Hospitalization:  No - Comment as needed Significant Relationships:  Spouse Lives with:  Spouse Do you feel safe going back to the place where you live?  Yes Need for family participation in patient care:  Yes (Comment)  Care giving concerns:  No reported at this time.    Social Worker assessment / plan:  CSW received consult by Therapist, sports. CSW went to speak with patient regarding the possible need for short term rehab. CSW introduced self and acknowledged the patient. Patient is alert and orientedx4. Patient was calm and cooperative with CSW assessment. CSW informed patient of PT recommendation for short term rehab. Patient and family is agreeable to SNF placement. CSW provided patient with a list of SNF facilities in [facility] and [place], and was provided permission to fax clinical information out to the facilities. Patient and family would prefer a facility in [facility], however is open to placement in [facility] if nothing is available. CSW to complete FL2 for MD signature. CSW to initiate SNF placement process.   Employment status:  Disabled (Comment on whether or not currently receiving Disability) Insurance information:  Managed Medicare (Equities trader) PT Recommendations:  [place] / Referral to community resources:  [place]  Patient/Family's Response to care:  Patient reports that she would like to go home, however knows that short term rehab will be beneficial for her.   Patient/Family's Understanding of and Emotional Response to Diagnosis, Current Treatment, and Prognosis:  Patient was very cooperative with CSW assessment. Husband is very involved in patient's care and is aware of current treatment and prognosis. Patient and husband is agreeable to disposition plan. Patient voiced that she would have liked to go home, however believes short term rehab will be very beneficial for her at this time. Husband and patient are hopeful for patient's progress and return back home. Patient and husband are appreciative of CSW intervention.     Emotional Assessment Appearance:  Appears stated age Attitude/Demeanor/Rapport:   (Calm and Cooperative ) Affect (typically observed):  Accepting, Appropriate, Calm Orientation:  Oriented to Self, Oriented to Place, Oriented to  Time, Oriented to Situation Alcohol / Substance use:  Not Applicable Psych involvement (Current and /or in the community):  No (Comment)  Discharge Needs  Concerns to be addressed:  Discharge Planning Concerns Readmission within the last 30 days:  No Current discharge risk:  Physical Impairment Barriers to Discharge:  Barriers Resolved   Raymondo Band, LCSW 11/27/2015, 11:07 AM

## 2015-11-28 LAB — GLUCOSE, CAPILLARY
GLUCOSE-CAPILLARY: 129 mg/dL — AB (ref 65–99)
GLUCOSE-CAPILLARY: 94 mg/dL (ref 65–99)
Glucose-Capillary: 96 mg/dL (ref 65–99)
Glucose-Capillary: 119 mg/dL — ABNORMAL HIGH (ref 65–99)

## 2015-11-28 NOTE — Progress Notes (Signed)
Physical Therapy Treatment Patient Details Name: Karen Reynolds MRN: [id number] DOB: October 12, 1951 Today's Date: 11/28/2015    History of Present Illness Pt is a 65 y.o. female who presents s/p L4-S1 on 11/23/15.  PMH includes bilateral foot drop, cancer, PONV, back surgery, surgeries on LUE, and arthritis, gout, and knee surgery.    PT Comments    Pt progressing slowly due to generalized weakness and fatigue after surgery. Pt ambulated 110' over multiple bouts. Continue to recommend SNF for further rehab before d/c home. PT will continue to follow.   Follow Up Recommendations  SNF;Supervision/Assistance - 24 hour     Equipment Recommendations  None recommended by PT    Recommendations for Other Services       Precautions / Restrictions Precautions Precautions: Fall;Back Precaution Booklet Issued: No Precaution Comments: Able to recall 2/3 back precautions, cues for arching Required Braces or Orthoses: Spinal Brace;Other Brace/Splint Spinal Brace: Lumbar corset;Applied in sitting position Other Brace/Splint: Rt AFO, Lt foot drop device, and Rt knee brace  Restrictions Weight Bearing Restrictions: No    Mobility  Bed Mobility Overal bed mobility: Needs Assistance Bed Mobility: Sit to Supine       Sit to supine: Min assist   General bed mobility comments: min A to bilateral LE's into bed, pt able to maintain SL position and then roll onto back without assistance  Transfers Overall transfer level: Needs assistance Equipment used: Rolling walker (2 wheeled) Transfers: Sit to/from Stand Sit to Stand: Min guard         General transfer comment: min-guard from toilet, recliner, and bed. Did not need physical assist to steady but does require increased time. Needs assist for donning LE braces before beginning ambulation  Ambulation/Gait Ambulation/Gait assistance: Min guard Ambulation Distance (Feet): 110 Feet (5', 5', 50' x2) Assistive device: Rolling walker (2  wheeled) Gait Pattern/deviations: Step-through pattern;Antalgic;Decreased stride length;Trunk flexed Gait velocity: Decreased Gait velocity interpretation: <1.8 ft/sec, indicative of risk for recurrent falls General Gait Details: 1 seated rest break, vc's for posture. Chair behind and discussed use of RW with seat which patient does have. Pt able to ambulate to and from bathroom without legs braces. Discussed possibilty of pt showering later today and spoke with RN   Stairs            Wheelchair Mobility    Modified Rankin (Stroke Patients Only)       Balance Overall balance assessment: Needs assistance Sitting-balance support: Feet supported Sitting balance-Leahy Scale: Good Sitting balance - Comments: Total A to donn braces and shoes.    Standing balance support: During functional activity Standing balance-Leahy Scale: Poor Standing balance comment: pt able to wipe self in standing after urinating today with one hand support on RW                    Cognition Arousal/Alertness: Awake/alert Behavior During Therapy: WFL for tasks assessed/performed Overall Cognitive Status: Within Functional Limits for tasks assessed                      Exercises      General Comments General comments (skin integrity, edema, etc.): discussed d/c plan as pt agreeable to SNF but still wary      Pertinent Vitals/Pain Pain Assessment: 0-10 Pain Score: 6  Pain Location: back Pain Descriptors / Indicators: Sore Pain Intervention(s): Monitored during session;Limited activity within patient's tolerance    Home Living  Prior Function            PT Goals (current goals can now be found in the care plan section) Acute Rehab PT Goals Patient Stated Goal: be able to get up stairs to her bedroom PT Goal Formulation: With patient Time For Goal Achievement: 12/08/15 Potential to Achieve Goals: Good Progress towards PT goals: Progressing  toward goals    Frequency  Min 5X/week    PT Plan Current plan remains appropriate    Co-evaluation             End of Session Equipment Utilized During Treatment: Back brace;Gait belt;Other (comment) Activity Tolerance: Patient tolerated treatment well Patient left: in bed;with call bell/phone within reach     Time: 0841-0920 PT Time Calculation (min) (ACUTE ONLY): 39 min  Charges:  $Gait Training: 23-37 mins $Therapeutic Activity: 8-22 mins                    G Codes:     Leighton Roach, PT  Acute Rehab Services  [place], [place] 11/28/2015, 10:19 AM

## 2015-11-28 NOTE — Progress Notes (Signed)
No issues overnight. Pt reports slow improvement in pain. No new complaints. No positional HA.  EXAM:  BP 139/63 mmHg  Pulse 66  Temp(Src) 98.2 F (36.8 C) (Oral)  Resp 18  Ht 5\' 5"  (1.651 m)  Wt 101.799 kg (224 lb 6.8 oz)  BMI 37.35 kg/m2  SpO2 97%  Awake, alert, oriented  Speech fluent, appropriate  CN grossly intact  5/5 BUE/BLE x right foot drop, minimal left DF Wound continues to drain serosanguinous fluid from superior aspect  IMPRESSION:  65 y.o. female s/p L4-S1 fusion, progressing slowly. Likely SNF placement tomorrow - Wound drainage continues, is likely serous. No evidence of CSF.  PLAN: - Cont to mobilize as tolerated - Possible SNF placement tomorrow

## 2015-11-28 NOTE — Progress Notes (Addendum)
Occupational Therapy Treatment Patient Details Name: LETHER DAMRON MRN: [id number] DOB: 28-Oct-1950 Today's Date: 11/28/2015    History of present illness Pt is a 65 y.o. female who presents s/p L4-S1 on 11/23/15.  PMH includes bilateral foot drop, cancer, PONV, back surgery, surgeries on LUE, and arthritis, gout, and knee surgery.   OT comments  Pt progressing. Education provided in session and d/c plan is now SNF. Will continue to follow acutely.  Follow Up Recommendations  SNF    Equipment Recommendations  Other (comment) (defer to next venue)    Recommendations for Other Services      Precautions / Restrictions Precautions Precautions: Fall;Back Precaution Booklet Issued: No Precaution Comments: reviewed back precautions with pt Required Braces or Orthoses: Spinal Brace;Other Brace/Splint Spinal Brace: Lumbar corset;Applied in sitting position Other Brace/Splint: Rt AFO, Lt foot drop device, and Rt knee brace  Restrictions Weight Bearing Restrictions: No       Mobility Bed Mobility Overal bed mobility: Needs Assistance Bed Mobility: Sidelying to Sit   Sidelying to sit: Supervision       General bed mobility comments: cue not to twist when pt was in bed.  Transfers Overall transfer level: Needs assistance Equipment used: Rolling walker (2 wheeled) Transfers: Sit to/from Stand Sit to Stand: Min guard         General transfer comment: cues for hand placement    Balance    Min guard for ambulation with RW. Able to perform functional task while standing without LOB-RW in front.                               ADL Overall ADL's : Needs assistance/impaired     Grooming: Oral care;Brushing hair;Wash/dry face;Sitting;Standing;Set up;Supervision/safety (also applied sanitizer to hands)           Upper Body Dressing : Set up;Supervision/safety;Sitting (back brace)     Lower Body Dressing Details (indicate cue type and reason): OT adjusted pt's  sock-total assist Toilet Transfer: Min guard;Grab bars;Comfort height toilet;Ambulation;RW   Toileting- Clothing Manipulation and Hygiene: Minimal assistance;Sit to/from stand       Functional mobility during ADLs: Min guard;Rolling walker General ADL Comments: Educated on AE and also discussed what pt could use for toilet aid. Discussed incorporating precautions into functional activities. Cues given for precautions in session. Pt stood at sink and performed part of grooming task, but sat down to do remaining task due to pain.      Vision                     Perception     Praxis      Cognition  Awake/Alert Behavior During Therapy: WFL for tasks assessed/performed Overall Cognitive Status: Within Functional Limits for tasks assessed                       Extremity/Trunk Assessment               Exercises     Shoulder Instructions       General Comments      Pertinent Vitals/ Pain       Pain Assessment: 0-10 Pain Score: 8  Pain Location: back Pain Descriptors / Indicators: Sore Pain Intervention(s): Monitored during session;Repositioned (notified nurse)  Home Living  Prior Functioning/Environment              Frequency Min 2X/week     Progress Toward Goals  OT Goals(current goals can now be found in the care plan section)  Progress towards OT goals: Progressing toward goals  Acute Rehab OT Goals Patient Stated Goal: not stated OT Goal Formulation: With patient Time For Goal Achievement: 12/01/15 Potential to Achieve Goals: Good ADL Goals Pt Will Perform Lower Body Dressing: with set-up;with supervision;with adaptive equipment;sit to/from stand Pt Will Transfer to Toilet: with supervision;with set-up;ambulating Pt Will Perform Toileting - Clothing Manipulation and hygiene: with supervision;sit to/from stand;with set-up Additional ADL Goal #1: Pt will independently  verbalize 3/3 back precautions and maintain during session.   Plan Discharge plan needs to be updated    Co-evaluation                 End of Session Equipment Utilized During Treatment: Other (comment);Rolling walker;Back brace (AE)   Activity Tolerance Patient limited by pain   Patient Left in chair;with call bell/phone within reach;with nursing/sitter in room   Nurse Communication Other (comment) (dressing saturated and to please change gown as well)        Time: [id number] OT Time Calculation (min): 15 min  Charges: OT General Charges $OT Visit: 1 Procedure OT Treatments $Self Care/Home Management : 8-22 mins  Benito Mccreedy OTR/L [id number] 11/28/2015, 4:39 PM

## 2015-11-29 LAB — GLUCOSE, CAPILLARY
GLUCOSE-CAPILLARY: 127 mg/dL — AB (ref 65–99)
GLUCOSE-CAPILLARY: 130 mg/dL — AB (ref 65–99)
GLUCOSE-CAPILLARY: 107 mg/dL — AB (ref 65–99)
Glucose-Capillary: 106 mg/dL — ABNORMAL HIGH (ref 65–99)

## 2015-11-29 MED ORDER — OXYCODONE-ACETAMINOPHEN 5-325 MG PO TABS
1.0000 | ORAL_TABLET | ORAL | Status: DC | PRN
  Administered 2015-11-29: 2 via ORAL
  Administered 2015-11-29 – 2015-11-30 (×2): 1 via ORAL
  Administered 2015-11-30: 2 via ORAL
  Administered 2015-11-30 (×3): 1 via ORAL
  Administered 2015-12-01 (×3): 2 via ORAL
  Filled 2015-11-29 (×5): qty 2
  Filled 2015-11-29: qty 1
  Filled 2015-11-29 (×2): qty 2
  Filled 2015-11-29 (×2): qty 1

## 2015-11-29 NOTE — Progress Notes (Signed)
Patient has been accepted to [facility] in [place], [place]. Patient and family would prefer this offer rather than [facility]. Facility has called family to confirm offer. CSW made a phone call to [facility] [employer] to inform of patient's choice. CSW will receive phone call with an update.  Authorization has been cancelled with [place], per facility representative.  CSW awaiting auth for [employer]. Once received, CSW will arrange transportation to facility.   Lucius Conn, [place] Worker [facility] Ph: [phone]

## 2015-11-29 NOTE — Progress Notes (Signed)
Authorization received for patient admission into [facility] in [place]. Facility made aware. Once patient is medically stable and ready for discharge, CSW will arrange transportation.   Lucius Conn, [place] Worker [facility] Ph: [phone]

## 2015-11-29 NOTE — Progress Notes (Signed)
CSW received phone call back from [place] at [facility] in [place]. Bed will still be available on tomorrow. CSW will continue to follow.   Lucius Conn, [place] Worker [facility] Ph: [phone]

## 2015-11-29 NOTE — Progress Notes (Signed)
Patient has accepted bed offer at [facility] and [place]. Facility has been informed of patient and husband's  selection. Per facility representative, she will submit for insurance authorization and then follow up with CSW.   Once CSW has received phone call back regarding authorization, CSW will arrange transportation for the patient to the facility.   CSW will continue to follow and provide support to patient while in hospital.   Lucius Conn, [place] Worker [facility] Ph: [phone]

## 2015-11-29 NOTE — Care Management Important Message (Signed)
Important Message  Patient Details  Name: Karen Reynolds MRN: [id number] Date of Birth: August 26, 1951   Medicare Important Message Given:  Yes    [name] 11/29/2015, 12:39 [place] Message  Patient Details  Name: Karen Reynolds MRN: [id number] Date of Birth: November 08, 1950   Medicare Important Message Given:  Yes    [name] G 11/29/2015, 12:39 PM

## 2015-11-29 NOTE — Clinical Social Work Placement (Addendum)
   CLINICAL SOCIAL WORK PLACEMENT  NOTE  Date:  11/29/2015  Patient Details  Name: Karen Reynolds MRN: [id number] Date of Birth: 01/24/1951  Clinical Social Work is seeking post-discharge placement for this patient at the [place] level of care (*CSW will initial, date and re-position this form in  chart as items are completed):  Yes   Patient/family provided with [place] Work Department's list of facilities offering this level of care within the geographic area requested by the patient (or if unable, by the patient's family).  Yes   Patient/family informed of their freedom to choose among providers that offer the needed level of care, that participate in Medicare, Medicaid or managed care program needed by the patient, have an available bed and are willing to accept the patient.      Patient/family informed of [facility]'s ownership interest in [facility] and [facility], as well as of the fact that they are under no obligation to receive care at these facilities.  PASRR submitted to [employer]DS on 11/27/15     PASRR number received on 11/27/15     Existing PASRR number confirmed on       FL2 transmitted to all facilities in geographic area requested by pt/family on 11/28/15     FL2 transmitted to all facilities within larger geographic area on 11/28/15     Patient informed that his/her managed care company has contracts with or will negotiate with certain facilities, including the following:        Yes   Patient/family informed of bed offers received.  Patient chooses bed at  [facility] in [place], [place])     Physician recommends and patient chooses bed at      Patient to be transferred to  [facility]) on              .  Patient to be transferred to facility by husband's car. (updated Evette Cristal, MSW, [place], 12/01/2015)  Patient family notified on  of transfer 12-01-15 (updated Evette Cristal, MSW, LCSWA,  12/01/2015)  Name of family member notified:  Patient's husband (updated Evette Cristal, MSW, LCSWA, 12/01/2015)    PHYSICIAN       Additional Comment:    _______________________________________________ Raymondo Band, LCSW 11/29/2015, 1:11 PM   Jones Broom. [place], MSW, [place] 12/01/2015 1:35 PM

## 2015-11-29 NOTE — Progress Notes (Signed)
Patient ID: Karen Reynolds, female   DOB: 1951-08-09, 65 y.o.   MRN: [id number] Vital signs are stable Motor function remains the same with foot drop on right and weakness of tibialis anterior 1 out of 5 on left Patient has been having substantial wound drainage over the weekend I have changed the dressing myself today and note that in a BD pad is partially saturated Pain of the incision with Betadine Remove several Steri-Strips that were placed I've discussed dressing changes and the importance of use of Betadine with each dressing change I will change dressing myself again in the morning Understand that a SNIF bed is available Patient is not ready for discharge until drainage is better controlled Will recheck CBC also

## 2015-11-29 NOTE — Progress Notes (Signed)
Pt states that her dressing became saturated frequently during night.  RN made aware.

## 2015-11-29 NOTE — Progress Notes (Signed)
Pt stated that she prefers [facility] over [facility].  Pt spoke with [facility] today and states they have bed available.   Will notify Lucius Conn, SW.

## 2015-11-29 NOTE — Progress Notes (Signed)
Physical Therapy Treatment Patient Details Name: Karen Reynolds MRN: [id number] DOB: 15-May-1951 Today's Date: 11/29/2015    History of Present Illness Pt is a 65 y.o. female who presents s/p L4-S1 on 11/23/15.  PMH includes bilateral foot drop, cancer, PONV, back surgery, surgeries on LUE, and arthritis, gout, and knee surgery.    PT Comments    Pt progressing towards physical therapy goals. PT set up L platform walker and adjusted for proper fit during session. Pt reports increased feeling of stability with platform support and was able to ambulate ~55 feet to test out the feel. Did not progress gait training as would like to have a chair follow for safety. Will continue to follow and progress as able per POC.   Follow Up Recommendations  SNF;Supervision/Assistance - 24 hour     Equipment Recommendations  None recommended by PT    Recommendations for Other Services       Precautions / Restrictions Precautions Precautions: Fall;Back Precaution Booklet Issued: No Precaution Comments: Reviewed back precautions during functional mobility.  Required Braces or Orthoses: Spinal Brace;Other Brace/Splint Spinal Brace: Lumbar corset;Applied in sitting position Other Brace/Splint: Rt AFO, Lt foot drop device, and Rt knee brace  Restrictions Weight Bearing Restrictions: No    Mobility  Bed Mobility               General bed mobility comments: Pt in bathroom with NT present upon PT arrival.   Transfers Overall transfer level: Needs assistance Equipment used: Rolling walker (2 wheeled) Transfers: Sit to/from Stand Sit to Stand: Min guard         General transfer comment: Min-guard from toilet and bed. Did not need physical assist to steady but does require increased time. Needs assist for donning LE braces before beginning ambulation  Ambulation/Gait Ambulation/Gait assistance: Min guard Ambulation Distance (Feet): 55 Feet Assistive device: Left platform walker Gait  Pattern/deviations: Step-through pattern;Decreased stride length Gait velocity: Decreased Gait velocity interpretation: Below normal speed for age/gender General Gait Details: Goal of gait training was to assess effectiveness of platform and adjust for proper alignment. Pt reports she has greater feeling of stability with platform vs regular RW.    Stairs            Wheelchair Mobility    Modified Rankin (Stroke Patients Only)       Balance Overall balance assessment: Needs assistance Sitting-balance support: Feet supported;No upper extremity supported Sitting balance-Leahy Scale: Good Sitting balance - Comments: Total A to donn braces and shoes.    Standing balance support: Bilateral upper extremity supported;During functional activity Standing balance-Leahy Scale: Poor                      Cognition Arousal/Alertness: Awake/alert Behavior During Therapy: WFL for tasks assessed/performed Overall Cognitive Status: Within Functional Limits for tasks assessed                      Exercises      General Comments General comments (skin integrity, edema, etc.): Noted active bloody drainage from incision site. Drainage exiting from inferior dressing. RN notified who was in contact room and unavailable to come in at the time. PT reinforced dressing until RN was able to come assess in person.       Pertinent Vitals/Pain Pain Assessment: Faces Faces Pain Scale: Hurts little more Pain Location: Back Pain Descriptors / Indicators: Operative site guarding;Discomfort Pain Intervention(s): Limited activity within patient's tolerance;Monitored during session;Repositioned    Home  Living                      Prior Function            PT Goals (current goals can now be found in the care plan section) Acute Rehab PT Goals Patient Stated Goal: not stated PT Goal Formulation: With patient Time For Goal Achievement: 12/08/15 Potential to Achieve Goals:  Good Progress towards PT goals: Progressing toward goals    Frequency  Min 5X/week    PT Plan Current plan remains appropriate    Co-evaluation             End of Session Equipment Utilized During Treatment: Gait belt;Back brace Activity Tolerance: Patient tolerated treatment well Patient left: in chair;with call bell/phone within reach     Time: 1015-1048 PT Time Calculation (min) (ACUTE ONLY): 33 min  Charges:  $Gait Training: 8-22 mins $Therapeutic Activity: 8-22 mins                    G Codes:      Rolinda Roan 2015-12-10, 11:04 AM   Rolinda Roan, PT, DPT Acute Rehabilitation Services Pager: [phone]

## 2015-11-30 LAB — GLUCOSE, CAPILLARY
GLUCOSE-CAPILLARY: 113 mg/dL — AB (ref 65–99)
Glucose-Capillary: 94 mg/dL (ref 65–99)
Glucose-Capillary: 87 mg/dL (ref 65–99)
Glucose-Capillary: 127 mg/dL — ABNORMAL HIGH (ref 65–99)

## 2015-11-30 LAB — CBC WITH DIFFERENTIAL/PLATELET
Basophils Absolute: 0 10*3/uL (ref 0.0–0.1)
Basophils Relative: 0 %
EOS ABS: 0.1 10*3/uL (ref 0.0–0.7)
EOS PCT: 1 %
HCT: 29.3 % — ABNORMAL LOW (ref 36.0–46.0)
Hemoglobin: 9.8 g/dL — ABNORMAL LOW (ref 12.0–15.0)
LYMPHS ABS: 2.1 10*3/uL (ref 0.7–4.0)
Lymphocytes Relative: 26 %
MCH: 30.5 pg (ref 26.0–34.0)
MCHC: 33.4 g/dL (ref 30.0–36.0)
MCV: 91.3 fL (ref 78.0–100.0)
MONO ABS: 0.4 10*3/uL (ref 0.1–1.0)
MONOS PCT: 5 %
Neutro Abs: 5.5 10*3/uL (ref 1.7–7.7)
Neutrophils Relative %: 68 %
PLATELETS: 228 10*3/uL (ref 150–400)
RBC: 3.21 MIL/uL — ABNORMAL LOW (ref 3.87–5.11)
RDW: 14.8 % (ref 11.5–15.5)
WBC: 8 10*3/uL (ref 4.0–10.5)

## 2015-11-30 MED ORDER — DEXAMETHASONE 0.5 MG PO TABS
0.5000 mg | ORAL_TABLET | Freq: Two times a day (BID) | ORAL | Status: DC
  Administered 2015-11-30 – 2015-12-01 (×2): 0.5 mg via ORAL
  Filled 2015-11-30 (×3): qty 1

## 2015-11-30 MED ORDER — DICLOFENAC SODIUM 75 MG PO TBEC
75.0000 mg | DELAYED_RELEASE_TABLET | Freq: Two times a day (BID) | ORAL | Status: DC
  Administered 2015-11-30 – 2015-12-01 (×3): 75 mg via ORAL
  Filled 2015-11-30 (×4): qty 1

## 2015-11-30 NOTE — Progress Notes (Signed)
Occupational Therapy Treatment Patient Details Name: Karen Reynolds MRN: [id number] DOB: [birth year] Today's Date: 11/30/2015    History of present illness Pt is a 65 y.o. female who presents s/p L4-S1 on 11/23/15.  PMH includes bilateral foot drop, cancer, PONV, back surgery, surgeries on LUE, and arthritis, gout, and knee surgery.   OT comments  Pt limited by pain. Will continue to follow acutely.  Follow Up Recommendations  SNF    Equipment Recommendations  Other (comment) (defer to next venue)    Recommendations for Other Services      Precautions / Restrictions Precautions Precautions: Fall;Back Precaution Booklet Issued: No Precaution Comments: reviewed back precautions with pt Required Braces or Orthoses: Spinal Brace;Other Brace/Splint Spinal Brace: Lumbar corset;Applied in sitting position Other Brace/Splint: Rt AFO, Lt foot drop device, and Rt knee brace  Restrictions Weight Bearing Restrictions: No       Mobility Bed Mobility Overal bed mobility: Needs Assistance Bed Mobility: Rolling;Sidelying to Sit;Sit to Sidelying Rolling: Supervision Sidelying to sit: Modified independent (Device/Increase time)     Sit to sidelying: Mod assist General bed mobility comments: cues for technique and for no twisting.  Transfers Overall transfer level: Needs assistance Equipment used: Left platform walker Transfers: Sit to/from Stand Sit to Stand: Min guard         General transfer comment: Min guard-Supervision for sit to stand.     Balance      Used left platform walker for sit to stand and for ambulation. Able to perform functional task while standing without LOB.                             ADL Overall ADL's : Needs assistance/impaired     Grooming: Wash/dry face;Oral care;Brushing hair;Sitting;Standing;Set up;Supervision/safety           Upper Body Dressing : Set up;Supervision/safety;Sitting Upper Body Dressing Details (indicate cue type  and reason): back brace Lower Body Dressing: Moderate assistance;Sit to/from stand;With adaptive equipment   Toilet Transfer: Min guard;Ambulation;Grab bars (used toilet in bathroom; used platform RW)   Toileting- Water quality scientist and Hygiene: Supervision/safety (standing and sitting)       Functional mobility during ADLs: Min guard (platform RW) General ADL Comments: cues for precautions in session. Practiced donning panties with reacher. Discussed managing toilet hygiene.     Vision                     Perception     Praxis      Cognition  Awake/Alert Behavior During Therapy: WFL for tasks assessed/performed Overall Cognitive Status: Within Functional Limits for tasks assessed                       Extremity/Trunk Assessment               Exercises     Shoulder Instructions       General Comments      Pertinent Vitals/ Pain       Pain Assessment: 0-10 Pain Score: 7  Pain Location: back and hips Pain Intervention(s): Monitored during session;Limited activity within patient's tolerance;Repositioned  Home Living                                          Prior Functioning/Environment  Frequency Min 2X/week     Progress Toward Goals  OT Goals(current goals can now be found in the care plan section)  Progress towards OT goals: Progressing toward goals  Acute Rehab OT Goals Patient Stated Goal: not stated OT Goal Formulation: With patient Time For Goal Achievement: 12/01/15 Potential to Achieve Goals: Good ADL Goals Pt Will Perform Lower Body Dressing: with set-up;with supervision;with adaptive equipment;sit to/from stand Pt Will Transfer to Toilet: with supervision;with set-up;ambulating Pt Will Perform Toileting - Clothing Manipulation and hygiene: with supervision;sit to/from stand;with set-up Additional ADL Goal #1: Pt will independently verbalize 3/3 back precautions and maintain during  session.   Plan Discharge plan remains appropriate    Co-evaluation                 End of Session Equipment Utilized During Treatment: Gait belt;Back brace;Other (comment) (left platform RW; reacher)   Activity Tolerance Patient limited by pain   Patient Left in bed;with call bell/phone within reach;with bed alarm set   Nurse Communication          Time: [id number] OT Time Calculation (min): 17 min  Charges: OT General Charges $OT Visit: 1 Procedure OT Treatments $Self Care/Home Management : 8-22 mins  Benito Mccreedy OTR/L [id number] 11/30/2015, 12:30 PM

## 2015-11-30 NOTE — Progress Notes (Signed)
Facility aware that patient is not medically stable and ready for discharge.   Lucius Conn, [place] Worker [facility] Ph: [phone]

## 2015-11-30 NOTE — Progress Notes (Signed)
Patient ID: Karen Reynolds, female   DOB: Jun 10, 1951, 66 y.o.   MRN: [id number] Vital signs are stable Dressing change performed today ABD pad is only partially soaked since last night Painted incision with Betadine and applied new dressing Will observe for next 24 hours If drainage continues to decrease will be ready for transfer White count is normal No evidence of fevers We'll continue to taper Decadron Start full tear and 75 mg twice a day for left leg pain

## 2015-12-01 DIAGNOSIS — M4316 Spondylolisthesis, lumbar region: Secondary | ICD-10-CM | POA: Diagnosis not present

## 2015-12-01 DIAGNOSIS — R262 Difficulty in walking, not elsewhere classified: Secondary | ICD-10-CM | POA: Diagnosis not present

## 2015-12-01 DIAGNOSIS — Z4889 Encounter for other specified surgical aftercare: Secondary | ICD-10-CM | POA: Diagnosis not present

## 2015-12-01 DIAGNOSIS — E669 Obesity, unspecified: Secondary | ICD-10-CM | POA: Diagnosis not present

## 2015-12-01 DIAGNOSIS — E119 Type 2 diabetes mellitus without complications: Secondary | ICD-10-CM | POA: Diagnosis not present

## 2015-12-01 DIAGNOSIS — G8918 Other acute postprocedural pain: Secondary | ICD-10-CM | POA: Diagnosis not present

## 2015-12-01 DIAGNOSIS — M21371 Foot drop, right foot: Secondary | ICD-10-CM | POA: Diagnosis not present

## 2015-12-01 LAB — GLUCOSE, CAPILLARY
GLUCOSE-CAPILLARY: 110 mg/dL — AB (ref 65–99)
GLUCOSE-CAPILLARY: 88 mg/dL (ref 65–99)

## 2015-12-01 MED ORDER — DICLOFENAC SODIUM 75 MG PO TBEC: 75.0000 mg | DELAYED_RELEASE_TABLET | Freq: Two times a day (BID) | ORAL | Status: DC

## 2015-12-01 MED ORDER — DEXAMETHASONE 0.5 MG PO TABS: ORAL_TABLET | ORAL | Status: DC

## 2015-12-01 NOTE — Progress Notes (Signed)
Physical Therapy Treatment Patient Details Name: Karen Reynolds MRN: [id number] DOB: Mar 12, 1951 Today's Date: 12/01/2015    History of Present Illness Pt is a 65 y.o. female who presents s/p L4-S1 on 11/23/15.  PMH includes bilateral foot drop, cancer, PONV, back surgery, surgeries on LUE, and arthritis, gout, and knee surgery.    PT Comments    Pt progressing towards physical therapy goals. Was able to improve ambulation distance this session with reports of minimal fatigue at end of gait training. Chair follow was utilized and pt took 1 seated rest break before walking back to the room (75' in one bout of ambulation). Pt anticipates d/c to SNF today. Will continue to follow and progress as able per POC.   Follow Up Recommendations  SNF;Supervision/Assistance - 24 hour     Equipment Recommendations  Other (comment) (L platform for walker)    Recommendations for Other Services       Precautions / Restrictions Precautions Precautions: Fall;Back Precaution Booklet Issued: No Precaution Comments: reviewed back precautions with pt Required Braces or Orthoses: Spinal Brace;Other Brace/Splint Spinal Brace: Lumbar corset;Applied in sitting position Other Brace/Splint: Rt AFO, Lt foot drop device, and Rt knee brace  Restrictions Weight Bearing Restrictions: No    Mobility  Bed Mobility Overal bed mobility: Needs Assistance Bed Mobility: Rolling;Sidelying to Sit;Sit to Sidelying Rolling: Modified independent (Device/Increase time) Sidelying to sit: Modified independent (Device/Increase time)       General bed mobility comments: Use of rails for support.No assist required.   Transfers Overall transfer level: Needs assistance Equipment used: Left platform walker Transfers: Sit to/from Stand Sit to Stand: Min guard         General transfer comment: Close guard for safety as pt powered-up to full standing position.   Ambulation/Gait Ambulation/Gait assistance: Min  assist Ambulation Distance (Feet): 150 Feet (75', seated rest, 75') Assistive device: Left platform walker Gait Pattern/deviations: Step-through pattern;Decreased stride length;Trunk flexed Gait velocity: Decreased Gait velocity interpretation: Below normal speed for age/gender General Gait Details: VC's for general safety and improved posture. Pt caught R toe on floor during swing through x2 and required very minimal assist to recover.    Stairs            Wheelchair Mobility    Modified Rankin (Stroke Patients Only)       Balance Overall balance assessment: Needs assistance Sitting-balance support: Feet supported;No upper extremity supported Sitting balance-Leahy Scale: Good     Standing balance support: Bilateral upper extremity supported;During functional activity Standing balance-Leahy Scale: Poor Standing balance comment: Requires UE support.                     Cognition Arousal/Alertness: Awake/alert Behavior During Therapy: WFL for tasks assessed/performed Overall Cognitive Status: Within Functional Limits for tasks assessed                      Exercises      General Comments        Pertinent Vitals/Pain Pain Assessment: Faces Faces Pain Scale: Hurts little more Pain Location: back Pain Descriptors / Indicators: Operative site guarding Pain Intervention(s): Monitored during session;Limited activity within patient's tolerance;Repositioned    Home Living                      Prior Function            PT Goals (current goals can now be found in the care plan section) Acute Rehab PT  Goals Patient Stated Goal: not stated PT Goal Formulation: With patient/family Time For Goal Achievement: 12/08/15 Potential to Achieve Goals: Good Progress towards PT goals: Progressing toward goals    Frequency  Min 5X/week    PT Plan Current plan remains appropriate    Co-evaluation             End of Session Equipment  Utilized During Treatment: Gait belt;Back brace Activity Tolerance: Patient tolerated treatment well Patient left: in chair;with call bell/phone within reach     Time: 1137-1156 PT Time Calculation (min) (ACUTE ONLY): 19 min  Charges:  $Gait Training: 8-22 mins                    G Codes:      Rolinda Roan Dec 10, 2015, 12:51 PM   Rolinda Roan, PT, DPT Acute Rehabilitation Services Pager: [phone]

## 2015-12-01 NOTE — Clinical Social Work Note (Signed)
CSW contacted [facility], who said they can take patient today.  CSW to arrange transportation and discharge planning.  Jones Broom. [place], MSW, [place] 12/01/2015 11:37 AM

## 2015-12-01 NOTE — Discharge Summary (Signed)
Physician Discharge Summary  Patient ID: Karen Reynolds MRN: [id number] DOB/AGE: 1951/04/12 65 y.o.  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 Active Problems:   Spondylolisthesis of lumbar region   Discharged Condition: good  Hospital Course: Patient was admitted to undergo surgical decompression arthrodesis at L4-5 and L5-S1 that she had rapidly progressive development of foot drops secondary to significant stenosis at those levels. The patient did well after surgery however she developed significant drainage of the wound on day 3 and this continued until day 8. This appears to be slowing now she is not had any fever she's been maintained on oral antibiotics with Keflex. She is discharged to a skilled nursing facility for some help with ambulation and self-care until the drainage stops in her status improves. She has been on a slow Decadron taper and is finishing this at the current time. She is using Voltaren as an adjunct for control of pain.  Consults: None  Significant Diagnostic Studies: None  Treatments: surgery: Decompression of L4-5 and L5-S1 with posterior lumbar interbody arthrodesis area peek spacers local autograft allograft pedicle screw fixation and posterior lateral arthrodesis L4 to sacrum.  Discharge Exam: Blood pressure 116/63, pulse 55, temperature 98.1 F (36.7 C), temperature source Oral, resp. rate 18, height 5\' 5"  (1.651 m), weight 101.799 kg (224 lb 6.8 oz), SpO2 96 %. Incision is clean there is drainage at the superior most aspect of the incision which is been partially soiling and AVD pad on a daily basis however the drainage is now much less swelling approximately one third of the pad as opposed to soiling 2 pads a day as 3 days  ago. patient has bilateral foot drops complete on the right incomplete on the left.  Disposition: Transfer to skilled nursing facility when bed available.  Discharge Instructions    Call MD for:  redness, tenderness, or signs of infection (pain, swelling, redness, odor or green/yellow discharge around incision site)    Complete by:  As directed      Call MD for:  redness, tenderness, or signs of infection (pain, swelling, redness, odor or green/yellow discharge around incision site)    Complete by:  As directed      Call MD for:  severe uncontrolled pain    Complete by:  As directed      Call MD for:  severe uncontrolled pain    Complete by:  As directed      Call MD for:  temperature >100.4    Complete by:  As directed      Call MD for:  temperature >100.4    Complete by:  As directed      Diet - low sodium heart healthy    Complete by:  As directed      Diet - low sodium heart healthy    Complete by:  As directed      Discharge instructions    Complete by:  As directed   2 tablets twice daily for 2 days, one tablet twice daily for 2 days, one tablet daily for 2 days.     Discharge instructions    Complete by:  As directed   Ok to shower without dressing or brace on. Pat incision dry and paint with betadine. Apply dressing daily until incision is dry and not draining for two days.  Increase activity slowly    Complete by:  As directed      Increase activity slowly    Complete by:  As directed      Nursing communication    Complete by:  As directed   Send patient home with supply of 4x4 guaze and betadine swab sticks and tape to do several dressing changes.            Medication List    TAKE these medications        allopurinol 300 MG tablet  Commonly known as:  ZYLOPRIM  Take 300 mg by mouth daily.     amLODipine 10 MG tablet  Commonly known as:  NORVASC  Take 10 mg by mouth daily.     aspirin EC 81 MG tablet  Take 81 mg by mouth daily.     atorvastatin 10 MG  tablet  Commonly known as:  LIPITOR  Take 10 mg by mouth daily.     cephALEXin 500 MG capsule  Commonly known as:  KEFLEX  Take 1 capsule (500 mg total) by mouth every 6 (six) hours.     dexamethasone 1 MG tablet  Commonly known as:  DECADRON  2 tablets twice daily for 2 days, one tablet twice daily for 2 days, one tablet daily for 2 days.     dexamethasone 0.5 MG tablet  Commonly known as:  DECADRON  .5 mg po twice today then one tablet daily until finished     diclofenac 75 MG EC tablet  Commonly known as:  VOLTAREN  Take 1 tablet (75 mg total) by mouth 2 (two) times daily.     hydrochlorothiazide 25 MG tablet  Commonly known as:  HYDRODIURIL  Take 25 mg by mouth daily.     ibuprofen 200 MG tablet  Commonly known as:  ADVIL,MOTRIN  Take 800 mg by mouth daily as needed for moderate pain.     meloxicam 15 MG tablet  Commonly known as:  MOBIC  Take 15 mg by mouth daily before breakfast.     metFORMIN 1000 MG tablet  Commonly known as:  GLUCOPHAGE  Take 1,000 mg by mouth 2 (two) times daily.     methocarbamol 500 MG tablet  Commonly known as:  ROBAXIN  Take 1 tablet (500 mg total) by mouth every 6 (six) hours as needed for muscle spasms.     metoprolol 100 MG tablet  Commonly known as:  LOPRESSOR  Take 50 mg by mouth 2 (two) times daily.     omeprazole 40 MG capsule  Commonly known as:  PRILOSEC  Take 40 mg by mouth daily.     oxyCODONE-acetaminophen 5-325 MG tablet  Commonly known as:  PERCOCET/ROXICET  Take 1-2 tablets by mouth every 4 (four) hours as needed for moderate pain.     SALONPAS PAIN RELIEF PATCH Pads  Apply 1 patch topically daily as needed (for pain).     valACYclovir 1000 MG tablet  Commonly known as:  VALTREX  Take 1,000 mg by mouth daily as needed (for fever blister flare ups).           Follow-up Information    Follow up with [facility] .   Specialty:  [place]   Contact information:   [address] [place] [phone]      Signed: Earleen Newport 12/01/2015, 9:33 AM

## 2015-12-01 NOTE — Progress Notes (Addendum)
Report given to Nurse Apolonio Schneiders from [facility]. Relay dressing change per Dr. Ellene Route request to nurse (pt can shower, pat site dry, apply betadine and covered 4x4 and  ABD dressing). All belongings sent with family along with RXs. Will transport to SNF per husband.    Ave Filter, RN

## 2015-12-01 NOTE — Clinical Social Work Note (Signed)
Patient to be d/c'ed today to [facility].  Patient and family agreeable to plans will transport via husband's vehicle RN to call report to [phone] room [phone].    Evette Cristal, MSW, [place]

## 2015-12-01 NOTE — Care Management Note (Signed)
Case Management Note  Patient Details  Name: KENITA GLAESER MRN: [id number] Date of Birth: 08/13/1951  Subjective/Objective:                    Action/Plan: Plan is for patient to discharge to [facility] today. No further needs per CM.   Expected Discharge Date:                  Expected Discharge Plan:  [place]  In-House Referral:     Discharge planning Services     Post Acute Care Choice:    Choice offered to:     DME Arranged:    DME Agency:     HH Arranged:    [place] Agency:     Status of Service:  Completed, signed off  Medicare Important Message Given:  Yes Date Medicare IM Given:    Medicare IM give by:    Date Additional Medicare IM Given:    Additional Medicare Important Message give by:     If discussed at [place] of Stay Meetings, dates discussed:    Additional Comments:  Pollie Friar, RN 12/01/2015, 12:06 PM

## 2015-12-06 DIAGNOSIS — M4316 Spondylolisthesis, lumbar region: Secondary | ICD-10-CM | POA: Diagnosis not present

## 2015-12-06 DIAGNOSIS — G8918 Other acute postprocedural pain: Secondary | ICD-10-CM | POA: Diagnosis not present

## 2015-12-06 DIAGNOSIS — R262 Difficulty in walking, not elsewhere classified: Secondary | ICD-10-CM | POA: Diagnosis not present

## 2015-12-06 DIAGNOSIS — Z4889 Encounter for other specified surgical aftercare: Secondary | ICD-10-CM | POA: Diagnosis not present

## 2015-12-21 DIAGNOSIS — M62551 Muscle wasting and atrophy, not elsewhere classified, right thigh: Secondary | ICD-10-CM | POA: Diagnosis not present

## 2015-12-22 DIAGNOSIS — E669 Obesity, unspecified: Secondary | ICD-10-CM | POA: Diagnosis not present

## 2015-12-22 DIAGNOSIS — M4316 Spondylolisthesis, lumbar region: Secondary | ICD-10-CM | POA: Diagnosis not present

## 2015-12-22 DIAGNOSIS — M21372 Foot drop, left foot: Secondary | ICD-10-CM | POA: Diagnosis not present

## 2015-12-22 DIAGNOSIS — M21371 Foot drop, right foot: Secondary | ICD-10-CM | POA: Diagnosis not present

## 2015-12-22 DIAGNOSIS — Z4789 Encounter for other orthopedic aftercare: Secondary | ICD-10-CM | POA: Diagnosis not present

## 2015-12-22 DIAGNOSIS — E1121 Type 2 diabetes mellitus with diabetic nephropathy: Secondary | ICD-10-CM | POA: Diagnosis not present

## 2015-12-22 DIAGNOSIS — Z79891 Long term (current) use of opiate analgesic: Secondary | ICD-10-CM | POA: Diagnosis not present

## 2015-12-22 DIAGNOSIS — M4306 Spondylolysis, lumbar region: Secondary | ICD-10-CM | POA: Diagnosis not present

## 2015-12-22 DIAGNOSIS — I10 Essential (primary) hypertension: Secondary | ICD-10-CM | POA: Diagnosis not present

## 2015-12-22 DIAGNOSIS — M1990 Unspecified osteoarthritis, unspecified site: Secondary | ICD-10-CM | POA: Diagnosis not present

## 2015-12-22 DIAGNOSIS — E1165 Type 2 diabetes mellitus with hyperglycemia: Secondary | ICD-10-CM | POA: Diagnosis not present

## 2015-12-22 DIAGNOSIS — Z7984 Long term (current) use of oral hypoglycemic drugs: Secondary | ICD-10-CM | POA: Diagnosis not present

## 2015-12-23 DIAGNOSIS — E1165 Type 2 diabetes mellitus with hyperglycemia: Secondary | ICD-10-CM | POA: Diagnosis not present

## 2015-12-23 DIAGNOSIS — Z1389 Encounter for screening for other disorder: Secondary | ICD-10-CM | POA: Diagnosis not present

## 2015-12-23 DIAGNOSIS — N189 Chronic kidney disease, unspecified: Secondary | ICD-10-CM | POA: Diagnosis not present

## 2015-12-23 DIAGNOSIS — J329 Chronic sinusitis, unspecified: Secondary | ICD-10-CM | POA: Diagnosis not present

## 2015-12-23 DIAGNOSIS — B9689 Other specified bacterial agents as the cause of diseases classified elsewhere: Secondary | ICD-10-CM | POA: Diagnosis not present

## 2015-12-30 DIAGNOSIS — M4316 Spondylolisthesis, lumbar region: Secondary | ICD-10-CM | POA: Diagnosis not present

## 2016-01-14 DIAGNOSIS — G629 Polyneuropathy, unspecified: Secondary | ICD-10-CM | POA: Diagnosis not present

## 2016-01-14 DIAGNOSIS — M5416 Radiculopathy, lumbar region: Secondary | ICD-10-CM | POA: Diagnosis not present

## 2016-01-18 DIAGNOSIS — M62551 Muscle wasting and atrophy, not elsewhere classified, right thigh: Secondary | ICD-10-CM | POA: Diagnosis not present

## 2016-01-23 DIAGNOSIS — Z79891 Long term (current) use of opiate analgesic: Secondary | ICD-10-CM | POA: Diagnosis not present

## 2016-01-23 DIAGNOSIS — E1165 Type 2 diabetes mellitus with hyperglycemia: Secondary | ICD-10-CM | POA: Diagnosis not present

## 2016-01-23 DIAGNOSIS — M4316 Spondylolisthesis, lumbar region: Secondary | ICD-10-CM | POA: Diagnosis not present

## 2016-01-23 DIAGNOSIS — E669 Obesity, unspecified: Secondary | ICD-10-CM | POA: Diagnosis not present

## 2016-01-23 DIAGNOSIS — M21371 Foot drop, right foot: Secondary | ICD-10-CM | POA: Diagnosis not present

## 2016-01-23 DIAGNOSIS — Z7984 Long term (current) use of oral hypoglycemic drugs: Secondary | ICD-10-CM | POA: Diagnosis not present

## 2016-01-23 DIAGNOSIS — N189 Chronic kidney disease, unspecified: Secondary | ICD-10-CM | POA: Diagnosis not present

## 2016-01-23 DIAGNOSIS — I129 Hypertensive chronic kidney disease with stage 1 through stage 4 chronic kidney disease, or unspecified chronic kidney disease: Secondary | ICD-10-CM | POA: Diagnosis not present

## 2016-01-23 DIAGNOSIS — E1121 Type 2 diabetes mellitus with diabetic nephropathy: Secondary | ICD-10-CM | POA: Diagnosis not present

## 2016-01-23 DIAGNOSIS — Z4789 Encounter for other orthopedic aftercare: Secondary | ICD-10-CM | POA: Diagnosis not present

## 2016-01-23 DIAGNOSIS — M1990 Unspecified osteoarthritis, unspecified site: Secondary | ICD-10-CM | POA: Diagnosis not present

## 2016-01-23 DIAGNOSIS — M4306 Spondylolysis, lumbar region: Secondary | ICD-10-CM | POA: Diagnosis not present

## 2016-01-23 DIAGNOSIS — M21372 Foot drop, left foot: Secondary | ICD-10-CM | POA: Diagnosis not present

## 2016-01-23 DIAGNOSIS — Z7982 Long term (current) use of aspirin: Secondary | ICD-10-CM | POA: Diagnosis not present

## 2016-02-02 DIAGNOSIS — M21372 Foot drop, left foot: Secondary | ICD-10-CM | POA: Diagnosis not present

## 2016-02-02 DIAGNOSIS — M545 Low back pain: Secondary | ICD-10-CM | POA: Diagnosis not present

## 2016-02-02 DIAGNOSIS — M21371 Foot drop, right foot: Secondary | ICD-10-CM | POA: Diagnosis not present

## 2016-02-07 DIAGNOSIS — M545 Low back pain: Secondary | ICD-10-CM | POA: Diagnosis not present

## 2016-02-07 DIAGNOSIS — M21371 Foot drop, right foot: Secondary | ICD-10-CM | POA: Diagnosis not present

## 2016-02-07 DIAGNOSIS — M21372 Foot drop, left foot: Secondary | ICD-10-CM | POA: Diagnosis not present

## 2016-02-09 DIAGNOSIS — M21372 Foot drop, left foot: Secondary | ICD-10-CM | POA: Diagnosis not present

## 2016-02-09 DIAGNOSIS — M545 Low back pain: Secondary | ICD-10-CM | POA: Diagnosis not present

## 2016-02-09 DIAGNOSIS — M21371 Foot drop, right foot: Secondary | ICD-10-CM | POA: Diagnosis not present

## 2016-02-14 DIAGNOSIS — M21371 Foot drop, right foot: Secondary | ICD-10-CM | POA: Diagnosis not present

## 2016-02-14 DIAGNOSIS — M545 Low back pain: Secondary | ICD-10-CM | POA: Diagnosis not present

## 2016-02-14 DIAGNOSIS — M21372 Foot drop, left foot: Secondary | ICD-10-CM | POA: Diagnosis not present

## 2016-02-17 DIAGNOSIS — M21372 Foot drop, left foot: Secondary | ICD-10-CM | POA: Diagnosis not present

## 2016-02-17 DIAGNOSIS — M21371 Foot drop, right foot: Secondary | ICD-10-CM | POA: Diagnosis not present

## 2016-02-17 DIAGNOSIS — M545 Low back pain: Secondary | ICD-10-CM | POA: Diagnosis not present

## 2016-02-18 DIAGNOSIS — M62551 Muscle wasting and atrophy, not elsewhere classified, right thigh: Secondary | ICD-10-CM | POA: Diagnosis not present

## 2016-02-21 DIAGNOSIS — M21372 Foot drop, left foot: Secondary | ICD-10-CM | POA: Diagnosis not present

## 2016-02-21 DIAGNOSIS — M21371 Foot drop, right foot: Secondary | ICD-10-CM | POA: Diagnosis not present

## 2016-02-21 DIAGNOSIS — M545 Low back pain: Secondary | ICD-10-CM | POA: Diagnosis not present

## 2016-02-23 DIAGNOSIS — M21372 Foot drop, left foot: Secondary | ICD-10-CM | POA: Diagnosis not present

## 2016-02-23 DIAGNOSIS — M21371 Foot drop, right foot: Secondary | ICD-10-CM | POA: Diagnosis not present

## 2016-02-23 DIAGNOSIS — M545 Low back pain: Secondary | ICD-10-CM | POA: Diagnosis not present

## 2016-03-01 DIAGNOSIS — M21372 Foot drop, left foot: Secondary | ICD-10-CM | POA: Diagnosis not present

## 2016-03-01 DIAGNOSIS — M21371 Foot drop, right foot: Secondary | ICD-10-CM | POA: Diagnosis not present

## 2016-03-01 DIAGNOSIS — M545 Low back pain: Secondary | ICD-10-CM | POA: Diagnosis not present

## 2016-03-02 DIAGNOSIS — M21371 Foot drop, right foot: Secondary | ICD-10-CM | POA: Diagnosis not present

## 2016-03-02 DIAGNOSIS — Z6836 Body mass index (BMI) 36.0-36.9, adult: Secondary | ICD-10-CM | POA: Diagnosis not present

## 2016-03-02 DIAGNOSIS — G6181 Chronic inflammatory demyelinating polyneuritis: Secondary | ICD-10-CM | POA: Diagnosis not present

## 2016-03-06 DIAGNOSIS — M21371 Foot drop, right foot: Secondary | ICD-10-CM | POA: Diagnosis not present

## 2016-03-06 DIAGNOSIS — M21372 Foot drop, left foot: Secondary | ICD-10-CM | POA: Diagnosis not present

## 2016-03-06 DIAGNOSIS — M545 Low back pain: Secondary | ICD-10-CM | POA: Diagnosis not present

## 2016-03-08 DIAGNOSIS — M21372 Foot drop, left foot: Secondary | ICD-10-CM | POA: Diagnosis not present

## 2016-03-08 DIAGNOSIS — M21371 Foot drop, right foot: Secondary | ICD-10-CM | POA: Diagnosis not present

## 2016-03-08 DIAGNOSIS — M545 Low back pain: Secondary | ICD-10-CM | POA: Diagnosis not present

## 2016-03-09 ENCOUNTER — Encounter: Payer: Self-pay | Admitting: Neurology

## 2016-03-09 ENCOUNTER — Ambulatory Visit (INDEPENDENT_AMBULATORY_CARE_PROVIDER_SITE_OTHER): Payer: PPO | Admitting: Neurology

## 2016-03-09 ENCOUNTER — Telehealth: Payer: Self-pay | Admitting: Neurology

## 2016-03-09 VITALS — BP 132/84 | HR 68 | Ht 65.0 in | Wt 225.0 lb

## 2016-03-09 DIAGNOSIS — R5382 Chronic fatigue, unspecified: Secondary | ICD-10-CM

## 2016-03-09 DIAGNOSIS — G6289 Other specified polyneuropathies: Secondary | ICD-10-CM | POA: Diagnosis not present

## 2016-03-09 DIAGNOSIS — E538 Deficiency of other specified B group vitamins: Secondary | ICD-10-CM | POA: Diagnosis not present

## 2016-03-09 DIAGNOSIS — G629 Polyneuropathy, unspecified: Secondary | ICD-10-CM

## 2016-03-09 HISTORY — DX: Polyneuropathy, unspecified: G62.9

## 2016-03-09 NOTE — Telephone Encounter (Signed)
FYI-Per Dr. Jannifer Franklin, patient needs NCV/EMG ASAP.  Used new pt slot and will be checking for an earlier appt.

## 2016-03-09 NOTE — Progress Notes (Signed)
Reason for visit: Peripheral neuropathy  Referring physician: Dr. Asa Lente is a 65 y.o. female  History of present illness:  [name] is a 65 year old right-handed white female with a history of diabetes diagnosed approximately one year ago, she indicates that her hemoglobin A1c is around 6.4, she is generally well controlled with her diabetes. She had lumbosacral spine surgery done around 1980, she has had bilateral total knee replacements done within the last year. She has been walking with a cane until October 2016. The patient had a sudden onset of a right-sided footdrop unassociated with pain or discomfort. Between October and December 2016, the patient has had gradual worsening of her ability to ambulate, with pain that has developed in both hips going down the legs with standing, going away with sitting. 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 The surgery was done in January 2017. Following surgery, the patient engaged in rehabilitation in an extended care facility. She seemed to make some progress, but she has continued to have significant gait problems, and she has developed a foot drop on the left side as well. The patient still has pain with standing, she can walk with a walker, she now has bilateral AFO braces. She has undergone EMG nerve conduction study evaluation done by Dr. Brien Few that reveals evidence of severe bilateral peroneal nerve dysfunction, there appears be some slowing of the posterior tibial nerves, with normal F wave latencies of these nerves. The H reflex latencies are absent. The sural sensory latencies are normal, amplitudes are slightly low. EMG of both lower extremities shows significant denervation the peroneal nerve distribution muscles and lumbosacral paraspinal muscles. Some increase in insertional activity in the medial gastrocnemius  muscle and in the biceps femoris muscle. The patient is sent to this office for an evaluation of a possible underlying peripheral neuropathy. The patient has been noted to be areflexic in the lower extremities. The patient denies any falls. She denies issues controlling the bowels or the bladder. She does report some numbness mainly in the right foot.  Past Medical History  Diagnosis Date  . Complication of anesthesia   . PONV (postoperative nausea and vomiting)   . Diabetes mellitus without complication ([place])   . Arthritis     degenerative joint disease, lumbar region degeneration    . Cancer ([place]) 1989    cervical ca  . Foot drop, bilateral   . Gout   . Hypertension   . High cholesterol   . Peripheral neuropathy ([place]) 03/09/2016    Past Surgical History  Procedure Laterality Date  . Joint replacement  3 &03/2015    bilateral knee  . Cholecystectomy    . Abdominal hysterectomy    . Back surgery  1980    lumbar- L5-S1- laminectomy   . Stapedes surgery Bilateral   . Arm surger Left 2013    plate & radial head done in [facility] x4 surgeries   . Cesarean section  1988  . Fracture surgery Left     x4    Family History  Problem Relation Age of Onset  . Heart disease Mother   . Stroke Father   . Dementia Sister     Social history:  reports that she has never smoked. She does not have any smokeless tobacco history on file. She reports that she does not drink alcohol or use illicit drugs.  Medications:  Prior  to Admission medications   Medication Sig Start Date End Date Taking? Authorizing Provider  allopurinol (ZYLOPRIM) 300 MG tablet Take 300 mg by mouth daily. 11/12/15  Yes Historical Provider, MD  amLODipine (NORVASC) 10 MG tablet Take 10 mg by mouth daily. 11/12/15  Yes Historical Provider, MD  aspirin EC 81 MG tablet Take 81 mg by mouth daily.    Yes Historical Provider, MD  atorvastatin (LIPITOR) 10 MG tablet Take 10 mg by mouth daily. 11/12/15  Yes Historical  Provider, MD  hydrochlorothiazide (HYDRODIURIL) 25 MG tablet Take 25 mg by mouth daily. 11/12/15  Yes Historical Provider, MD  ibuprofen (ADVIL,MOTRIN) 200 MG tablet Take 800 mg by mouth daily as needed for moderate pain.    Yes Historical Provider, MD  Liniments (SALONPAS PAIN RELIEF PATCH) PADS Apply 1 patch topically daily as needed (for pain).    Yes Historical Provider, MD  meloxicam (MOBIC) 15 MG tablet Take 15 mg by mouth daily before breakfast.  11/12/15  Yes Historical Provider, MD  metFORMIN (GLUCOPHAGE) 1000 MG tablet Take 1,000 mg by mouth 2 (two) times daily. 11/12/15  Yes Historical Provider, MD  metoprolol (LOPRESSOR) 100 MG tablet Take 50 mg by mouth 2 (two) times daily. 10/28/15  Yes Historical Provider, MD  Multiple Vitamin (MULTIVITAMIN) capsule Take by mouth.   Yes Historical Provider, MD  omeprazole (PRILOSEC) 40 MG capsule Take 40 mg by mouth daily. 10/28/15  Yes Historical Provider, MD  oxyCODONE-acetaminophen (PERCOCET/ROXICET) 5-325 MG tablet Take 1-2 tablets by mouth every 4 (four) hours as needed for moderate pain. 11/26/15  Yes Kristeen Miss, MD  predniSONE (STERAPRED UNI-PAK 21 TAB) 10 MG (21) TBPK tablet Take by mouth. 03/02/16  Yes Historical Provider, MD  valACYclovir (VALTREX) 1000 MG tablet Take 1,000 mg by mouth daily as needed (for fever blister flare ups).    Yes Historical Provider, MD  Vitamin D, Cholecalciferol, 1000 units TABS Take by mouth.   Yes Historical Provider, MD      Allergies  Allergen Reactions  . Metolazone Other (See Comments)    Severe weight loss    ROS:  Out of a complete 14 system review of symptoms, the patient complains only of the following symptoms, and all other reviewed systems are negative.  Swelling in the legs Easy bruising Numbness, weakness  Blood pressure 132/84, pulse 68, height 5\' 5"  (1.651 m), weight 225 lb (102.059 kg).  Physical Exam  General: The patient is alert and cooperative at the time of the examination. The  patient is markedly obese.  Eyes: Pupils are equal, round, and reactive to light. Discs are flat bilaterally.  Neck: The neck is supple, no carotid bruits are noted.  Respiratory: The respiratory examination is clear.  Cardiovascular: The cardiovascular examination reveals a regular rate and rhythm, no obvious murmurs or rubs are noted.  Skin: Extremities are with 2+ edema at the ankles bilaterally.  Neurologic Exam  Mental status: The patient is alert and oriented x 3 at the time of the examination. The patient has apparent normal recent and remote memory, with an apparently normal attention span and concentration ability.  Cranial nerves: Facial symmetry is present. There is good sensation of the face to pinprick and soft touch bilaterally. The strength of the facial muscles and the muscles to head turning and shoulder shrug are normal bilaterally. Speech is well enunciated, no aphasia or dysarthria is noted. Extraocular movements are full. Visual fields are full. The tongue is midline, and the patient has symmetric elevation of  the soft palate. No obvious hearing deficits are noted.  Motor: The motor testing reveals 4/5 strength in the intrinsic muscles of the hands bilaterally, worse on the left side. The patient has bilateral mild biceps muscle weakness, some mild right triceps muscle weakness, normal on the left. The patient has weakness with external rotation of the arms bilaterally, on the right arm there is weakness of supination, not pronation. Minimal weakness of the left deltoid muscle is noted. With the lower extremities, there is 3/5 strength with hip flexion bilaterally, slightly worse on the right. The patient has 4/5 strength with knee extension and flexion, severe bilateral foot drops are noted. The patient is able to wiggle the toes on the left foot, minimal movement on the right.  Sensory: Sensory testing is intact to pinprick, soft touch, vibration sensation, and position  sense on the upper extremities. With the lower extremities, there is a stocking pattern pinprick sensory deficit up to the knees bilaterally. More prominent numbness is noted in the peroneal nerve distribution on both legs, right greater than left. Vibration sensation is depressed on the right foot, present on the left. Position sensation is decreased on the right foot, present on the left. No evidence of extinction is noted.  Coordination: Cerebellar testing reveals good finger-nose-finger bilaterally. The patient has difficulty performing heel-to-shin on both sides.  Gait and station: Gait is wide-based, unsteady. The patient requires assistance for ambulation. The Romberg testing is negative, tandem gait was not attempted.  Reflexes: Deep tendon reflexes are symmetric, areflexic in the legs, on the arms the reflexes on the biceps and right triceps muscles are present and normal to slightly brisk, decreased on the left triceps. Toes are downgoing bilaterally.   Assessment/Plan:  1. Bilateral lower extremity and upper extremity weakness, peripheral neuropathy  2. History of diabetes  3. Recent lumbosacral spine surgery  The patient has evidence of significant proximal and distal weakness in both legs, the patient has proximal and distal weakness in both arms. The patient has well-maintained reflexes in arms, going against the diagnosis of CIDP. Also, the F wave latencies for the posterior tibial nerves are normal, again somewhat atypical for CIDP. The patient will be sent for blood work today, she will have nerve conduction studies on both arms and one leg, EMG on one arm. Further evaluation may be required depending upon the results of the above. The patient could potentially require lumbar puncture.  Jill Alexanders MD 03/09/2016 8:50 PM  [facility] [address] [place], [place] [address]  Phone [phone] Fax [phone]

## 2016-03-09 NOTE — Patient Instructions (Signed)

## 2016-03-13 DIAGNOSIS — M545 Low back pain: Secondary | ICD-10-CM | POA: Diagnosis not present

## 2016-03-13 DIAGNOSIS — M21372 Foot drop, left foot: Secondary | ICD-10-CM | POA: Diagnosis not present

## 2016-03-13 DIAGNOSIS — M21371 Foot drop, right foot: Secondary | ICD-10-CM | POA: Diagnosis not present

## 2016-03-13 LAB — MULTIPLE MYELOMA PANEL, SERUM
Albumin SerPl Elph-Mcnc: 3.8 g/dL (ref 2.9–4.4)
Albumin/Glob SerPl: 1.2 (ref 0.7–1.7)
Alpha 1: 0.2 g/dL (ref 0.0–0.4)
Alpha2 Glob SerPl Elph-Mcnc: 1.3 g/dL — ABNORMAL HIGH (ref 0.4–1.0)
B-Globulin SerPl Elph-Mcnc: 1 g/dL (ref 0.7–1.3)
Gamma Glob SerPl Elph-Mcnc: 0.7 g/dL (ref 0.4–1.8)
Globulin, Total: 3.3 g/dL (ref 2.2–3.9)
IGG (IMMUNOGLOBIN G), SERUM: 683 mg/dL — AB (ref 700–1600)
IGM (IMMUNOGLOBULIN M), SRM: 59 mg/dL (ref 26–217)
IgA/Immunoglobulin A, Serum: 195 mg/dL (ref 87–352)
TOTAL PROTEIN: 7.1 g/dL (ref 6.0–8.5)

## 2016-03-13 LAB — ANA W/REFLEX: Anti Nuclear Antibody(ANA): NEGATIVE

## 2016-03-13 LAB — PAN-ANCA

## 2016-03-13 LAB — CK: Total CK: 103 U/L (ref 24–173)

## 2016-03-13 LAB — VITAMIN B12: Vitamin B-12: >2000 pg/mL — ABNORMAL HIGH (ref 211–946)

## 2016-03-13 LAB — RHEUMATOID FACTOR

## 2016-03-13 LAB — ACETYLCHOLINE RECEPTOR, BINDING: AChR Binding Ab, Serum: <0.03 nmol/L (ref 0.00–0.24)

## 2016-03-13 LAB — B. BURGDORFI ANTIBODIES: Lyme IgG/IgM Ab: <0.91 {ISR} (ref 0.00–0.90)

## 2016-03-13 LAB — HIV ANTIBODY (ROUTINE TESTING W REFLEX): HIV SCREEN 4TH GENERATION: NONREACTIVE

## 2016-03-13 LAB — ANGIOTENSIN CONVERTING ENZYME: Angio Convert Enzyme: 43 U/L (ref 14–82)

## 2016-03-13 LAB — TSH: TSH: 1.05 u[IU]/mL (ref 0.450–4.500)

## 2016-03-13 LAB — SEDIMENTATION RATE: Sed Rate: 24 mm/hr (ref 0–40)

## 2016-03-19 DIAGNOSIS — M62551 Muscle wasting and atrophy, not elsewhere classified, right thigh: Secondary | ICD-10-CM | POA: Diagnosis not present

## 2016-03-21 ENCOUNTER — Encounter: Payer: Self-pay | Admitting: Neurology

## 2016-03-21 ENCOUNTER — Ambulatory Visit (INDEPENDENT_AMBULATORY_CARE_PROVIDER_SITE_OTHER): Payer: Self-pay | Admitting: Neurology

## 2016-03-21 ENCOUNTER — Ambulatory Visit (INDEPENDENT_AMBULATORY_CARE_PROVIDER_SITE_OTHER): Payer: PPO | Admitting: Neurology

## 2016-03-21 DIAGNOSIS — G6289 Other specified polyneuropathies: Secondary | ICD-10-CM

## 2016-03-21 DIAGNOSIS — G5601 Carpal tunnel syndrome, right upper limb: Secondary | ICD-10-CM

## 2016-03-21 NOTE — Progress Notes (Signed)
Karen Reynolds comes in today for evaluation of bilateral leg weakness, particularly with foot drops. The patient has mild weakness primarily in the C5 distribution muscles of the arms. She has left ulnar nerve distribution weakness. The patient had difficulty with walking.  EMG study and nerve conduction study does not show evidence of a peripheral neuropathy. There is no true evidence of CIDP. 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 Clinical examination of the left leg and her right leg shows L5 nerve root distribution weakness, this is more prominent on the left. The patient has hamstring weakness and abductor weakness of the left leg more so than that on the right.  The carpal tunnel syndrome may be associated with the diagnosis of diabetes. The left ulnar neuropathy associated with prior trauma left elbow requiring surgery in this area. The peroneal nerve findings on the left appear to be most consistent with an L5 radiculopathy that is severe, particularly when combined with the clinical motor examination.  May take 1.5 years for full improvement of the leg weakness following decompressive surgery that was done in January 2017. The patient indicates that painless onset of the weakness, which would be unusual. We will follow the patient over time, blood work recently done was unremarkable. The patient will follow-up in 4 months.

## 2016-03-21 NOTE — Progress Notes (Signed)
Please refer to EMG and nerve conduction study procedure note. 

## 2016-03-21 NOTE — Procedures (Signed)
     HISTORY:  Karen Reynolds is a 65 year old patient with a history diabetes who has had development of bilateral foot drops of the last 9 months or so. The patient has had lumbosacral spine surgery, she had painless onset of the motor deficits in the legs. The patient is being evaluated for possible underlying peripheral neuropathy as an etiology for the leg weakness.  NERVE CONDUCTION STUDIES:  Nerve conduction studies were performed on both upper extremities. The distal motor latency for the right median nerve was prolonged, normal on the left. The motor amplitudes for these nerves were normal bilaterally. The distal motor latencies for the ulnar nerves were normal bilaterally, with a low motor amplitude on the left, normal on the right. The F wave latencies and nerve conduction velocity for the median nerves were normal bilaterally. The F wave latency for the left ulnar nerve was prolonged, normal on the right. The nerve conduction velocities for the left median nerve were slowed above and below the elbow, normal on the right. The sensory latencies for the median nerves were prolonged on the right, normal on the left, and normal for the right ulnar nerve, prolonged for the left ulnar nerve.  Nerve conduction study was performed on the left lower extremity. No response was seen for the left peroneal nerve, but the sensory latency was normal for this nerve. The distal motor latency and motor amplitude for the left posterior tibial nerve was normal with a normal sural sensory latency. The H reflex latency was unobtainable.  EMG STUDIES:  EMG study was performed on the left upper extremity:  The first dorsal interosseous muscle reveals 2 to 4 K units with full recruitment. No fibrillations or positive waves were noted. The abductor pollicis brevis muscle reveals 2 to 5 K units with decreased recruitment. No fibrillations or positive waves were noted. The extensor indicis proprius muscle reveals 1  to 3 K units with full recruitment. No fibrillations or positive waves were noted. The pronator teres muscle reveals 2 to 3 K units with full recruitment. No fibrillations or positive waves were noted. The biceps muscle reveals 1 to 2 K units with full recruitment. No fibrillations or positive waves were noted. The triceps muscle reveals 2 to 4 K units with full recruitment. No fibrillations or positive waves were noted. The anterior deltoid muscle reveals 2 to 3 K units with full recruitment. No fibrillations or positive waves were noted. The cervical paraspinal muscles were tested at 2 levels. No abnormalities of insertional activity were seen at either level tested. There was good relaxation.   IMPRESSION:  Nerve conduction studies done on both upper extremities and on the left lower extremity shows evidence of multiple mononeuropathies. 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 There appears to be a severe motor peroneal neuropathy with sensory sparing. There is no evidence of a peripheral neuropathy. The findings are not fully consistent with CIDP. The ulnar neuropathy is associated with prior trauma to the left elbow with associated surgery. EMG evaluation of the right upper extremity shows findings consistent with carpal tunnel syndrome, no evidence of an overlying cervical radiculopathy was seen.  Jill Alexanders MD 03/21/2016 4:35 PM  [facility] [address] [place], [place] [address]  Phone [phone] Fax [phone]

## 2016-03-22 DIAGNOSIS — M21372 Foot drop, left foot: Secondary | ICD-10-CM | POA: Diagnosis not present

## 2016-03-22 DIAGNOSIS — M21371 Foot drop, right foot: Secondary | ICD-10-CM | POA: Diagnosis not present

## 2016-03-22 DIAGNOSIS — M545 Low back pain: Secondary | ICD-10-CM | POA: Diagnosis not present

## 2016-03-29 ENCOUNTER — Encounter: Payer: PPO | Admitting: Neurology

## 2016-03-30 DIAGNOSIS — I10 Essential (primary) hypertension: Secondary | ICD-10-CM | POA: Diagnosis not present

## 2016-03-30 DIAGNOSIS — M21372 Foot drop, left foot: Secondary | ICD-10-CM | POA: Diagnosis not present

## 2016-03-30 DIAGNOSIS — R350 Frequency of micturition: Secondary | ICD-10-CM | POA: Diagnosis not present

## 2016-03-30 DIAGNOSIS — M545 Low back pain: Secondary | ICD-10-CM | POA: Diagnosis not present

## 2016-03-30 DIAGNOSIS — J40 Bronchitis, not specified as acute or chronic: Secondary | ICD-10-CM | POA: Diagnosis not present

## 2016-03-30 DIAGNOSIS — E1165 Type 2 diabetes mellitus with hyperglycemia: Secondary | ICD-10-CM | POA: Diagnosis not present

## 2016-03-30 DIAGNOSIS — M21371 Foot drop, right foot: Secondary | ICD-10-CM | POA: Diagnosis not present

## 2016-04-03 DIAGNOSIS — M21371 Foot drop, right foot: Secondary | ICD-10-CM | POA: Diagnosis not present

## 2016-04-03 DIAGNOSIS — M545 Low back pain: Secondary | ICD-10-CM | POA: Diagnosis not present

## 2016-04-03 DIAGNOSIS — M21372 Foot drop, left foot: Secondary | ICD-10-CM | POA: Diagnosis not present

## 2016-04-05 DIAGNOSIS — M21371 Foot drop, right foot: Secondary | ICD-10-CM | POA: Diagnosis not present

## 2016-04-05 DIAGNOSIS — M545 Low back pain: Secondary | ICD-10-CM | POA: Diagnosis not present

## 2016-04-05 DIAGNOSIS — M21372 Foot drop, left foot: Secondary | ICD-10-CM | POA: Diagnosis not present

## 2016-04-10 DIAGNOSIS — Z96652 Presence of left artificial knee joint: Secondary | ICD-10-CM | POA: Diagnosis not present

## 2016-04-10 DIAGNOSIS — M21372 Foot drop, left foot: Secondary | ICD-10-CM | POA: Diagnosis not present

## 2016-04-10 DIAGNOSIS — M21371 Foot drop, right foot: Secondary | ICD-10-CM | POA: Diagnosis not present

## 2016-04-10 DIAGNOSIS — M545 Low back pain: Secondary | ICD-10-CM | POA: Diagnosis not present

## 2016-04-12 DIAGNOSIS — M21371 Foot drop, right foot: Secondary | ICD-10-CM | POA: Diagnosis not present

## 2016-04-12 DIAGNOSIS — M21372 Foot drop, left foot: Secondary | ICD-10-CM | POA: Diagnosis not present

## 2016-04-12 DIAGNOSIS — M545 Low back pain: Secondary | ICD-10-CM | POA: Diagnosis not present

## 2016-04-17 DIAGNOSIS — M21371 Foot drop, right foot: Secondary | ICD-10-CM | POA: Diagnosis not present

## 2016-04-17 DIAGNOSIS — M21372 Foot drop, left foot: Secondary | ICD-10-CM | POA: Diagnosis not present

## 2016-04-17 DIAGNOSIS — M545 Low back pain: Secondary | ICD-10-CM | POA: Diagnosis not present

## 2016-04-19 DIAGNOSIS — M21371 Foot drop, right foot: Secondary | ICD-10-CM | POA: Diagnosis not present

## 2016-04-19 DIAGNOSIS — M21372 Foot drop, left foot: Secondary | ICD-10-CM | POA: Diagnosis not present

## 2016-04-19 DIAGNOSIS — M62551 Muscle wasting and atrophy, not elsewhere classified, right thigh: Secondary | ICD-10-CM | POA: Diagnosis not present

## 2016-04-19 DIAGNOSIS — M545 Low back pain: Secondary | ICD-10-CM | POA: Diagnosis not present

## 2016-04-26 DIAGNOSIS — M21372 Foot drop, left foot: Secondary | ICD-10-CM | POA: Diagnosis not present

## 2016-04-26 DIAGNOSIS — M21371 Foot drop, right foot: Secondary | ICD-10-CM | POA: Diagnosis not present

## 2016-04-26 DIAGNOSIS — M545 Low back pain: Secondary | ICD-10-CM | POA: Diagnosis not present

## 2016-05-01 DIAGNOSIS — M21372 Foot drop, left foot: Secondary | ICD-10-CM | POA: Diagnosis not present

## 2016-05-01 DIAGNOSIS — M545 Low back pain: Secondary | ICD-10-CM | POA: Diagnosis not present

## 2016-05-01 DIAGNOSIS — M21371 Foot drop, right foot: Secondary | ICD-10-CM | POA: Diagnosis not present

## 2016-05-03 DIAGNOSIS — G6181 Chronic inflammatory demyelinating polyneuritis: Secondary | ICD-10-CM | POA: Diagnosis not present

## 2016-05-03 DIAGNOSIS — M5416 Radiculopathy, lumbar region: Secondary | ICD-10-CM | POA: Diagnosis not present

## 2016-05-12 ENCOUNTER — Other Ambulatory Visit (HOSPITAL_COMMUNITY): Payer: Self-pay | Admitting: Neurological Surgery

## 2016-05-12 ENCOUNTER — Other Ambulatory Visit: Payer: Self-pay | Admitting: Neurological Surgery

## 2016-05-12 DIAGNOSIS — M5416 Radiculopathy, lumbar region: Secondary | ICD-10-CM

## 2016-05-19 DIAGNOSIS — M62551 Muscle wasting and atrophy, not elsewhere classified, right thigh: Secondary | ICD-10-CM | POA: Diagnosis not present

## 2016-05-31 ENCOUNTER — Ambulatory Visit (HOSPITAL_COMMUNITY)
Admission: RE | Admit: 2016-05-31 | Discharge: 2016-05-31 | Disposition: A | Discharge status: Home / Self Care | Payer: PPO | Source: Ambulatory Visit | Attending: Neurological Surgery | Admitting: Neurological Surgery

## 2016-05-31 DIAGNOSIS — M21371 Foot drop, right foot: Secondary | ICD-10-CM | POA: Diagnosis not present

## 2016-05-31 DIAGNOSIS — Z981 Arthrodesis status: Secondary | ICD-10-CM | POA: Insufficient documentation

## 2016-05-31 DIAGNOSIS — M21372 Foot drop, left foot: Secondary | ICD-10-CM | POA: Diagnosis not present

## 2016-05-31 DIAGNOSIS — M5416 Radiculopathy, lumbar region: Secondary | ICD-10-CM

## 2016-05-31 DIAGNOSIS — M4806 Spinal stenosis, lumbar region: Secondary | ICD-10-CM | POA: Insufficient documentation

## 2016-05-31 LAB — GLUCOSE, CAPILLARY: Glucose-Capillary: 186 mg/dL — ABNORMAL HIGH (ref 65–99)

## 2016-05-31 MED ORDER — KETOROLAC TROMETHAMINE 30 MG/ML IJ SOLN
INTRAMUSCULAR | Status: AC
  Filled 2016-05-31: qty 1

## 2016-05-31 MED ORDER — ONDANSETRON HCL 4 MG/2ML IJ SOLN: 4.0000 mg | Freq: Four times a day (QID) | INTRAMUSCULAR | Status: DC | PRN

## 2016-05-31 MED ORDER — DIAZEPAM 5 MG PO TABS
ORAL_TABLET | ORAL | Status: AC
  Filled 2016-05-31: qty 2

## 2016-05-31 MED ORDER — OXYCODONE-ACETAMINOPHEN 5-325 MG PO TABS
ORAL_TABLET | ORAL | Status: AC
  Filled 2016-05-31: qty 2

## 2016-05-31 MED ORDER — LIDOCAINE HCL 1 % IJ SOLN
INTRAMUSCULAR | Status: AC
  Filled 2016-05-31: qty 10

## 2016-05-31 MED ORDER — KETOROLAC TROMETHAMINE 15 MG/ML IJ SOLN
15.0000 mg | Freq: Once | INTRAMUSCULAR | Status: AC
  Administered 2016-05-31: 15 mg via INTRAMUSCULAR
  Filled 2016-05-31: qty 1

## 2016-05-31 MED ORDER — LIDOCAINE HCL (PF) 1 % IJ SOLN
10.0000 mL | Freq: Once | INTRAMUSCULAR | Status: AC
  Administered 2016-05-31: 5 mL via INTRADERMAL

## 2016-05-31 MED ORDER — OXYCODONE-ACETAMINOPHEN 5-325 MG PO TABS
1.0000 | ORAL_TABLET | ORAL | Status: DC | PRN
  Administered 2016-05-31: 2 via ORAL
  Filled 2016-05-31: qty 2

## 2016-05-31 MED ORDER — DIAZEPAM 5 MG PO TABS
10.0000 mg | ORAL_TABLET | Freq: Once | ORAL | Status: AC
  Administered 2016-05-31: 10 mg via ORAL

## 2016-05-31 MED ORDER — IOPAMIDOL (ISOVUE-M 200) INJECTION 41%
INTRAMUSCULAR | Status: AC
  Administered 2016-05-31: 12 mL via INTRATHECAL
  Filled 2016-05-31: qty 10

## 2016-05-31 MED ORDER — IOPAMIDOL (ISOVUE-M 200) INJECTION 41%
20.0000 mL | Freq: Once | INTRAMUSCULAR | Status: AC
  Administered 2016-05-31: 12 mL via INTRATHECAL

## 2016-05-31 NOTE — Discharge Instructions (Signed)
Myelography, Care After °These instructions give you information on caring for yourself after your procedure. Your doctor may also give you more specific instructions. Call your doctor if you have any problems or questions after your procedure. °HOME CARE °· Rest the first day. °· When you rest, lie flat, with your head slightly raised (elevated). °· Avoid heavy lifting and activity for 48 hours, or as told by your doctor. °· You may take the bandage (dressing) off one day after the test, or as told by your doctor. °· Take all medicines only as told by your doctor. °· Ask your doctor when it is okay to take a shower or bath. °· Ask your doctor when your test results will be ready and how you can get them. Make sure you follow up and get your results. °· Do not drink alcohol for 24 hours, or as told by your doctor. °· Drink enough fluid to keep your pee (urine) clear or pale yellow. °GET HELP IF:  °· You have a fever. °· You have a headache. °· You feel sick to your stomach (nauseous) or throw up (vomit). °· You have pain or cramping in your belly (abdomen). °GET HELP RIGHT AWAY IF:  °· You have a headache with a stiff neck or fever. °· You have trouble breathing. °· Any of the places where the needles were put in are: °¨ Puffy (swollen) or red. °¨ Sore or hot to the touch. °¨ Draining yellowish-white fluid (pus). °¨ Bleeding. °MAKE SURE YOU: °· Understand these instructions. °· Will watch your condition. °· Will get help right away if you are not doing well or get worse. °  °This information is not intended to replace advice given to you by your health care provider. Make sure you discuss any questions you have with your health care provider. °  °Document Released: 07/18/2008 Document Revised: 10/30/2014 Document Reviewed: 07/03/2012 °Elsevier Interactive Patient Education ©2016 [employer] ° °

## 2016-05-31 NOTE — Procedures (Signed)
Is a 65 year old individual who has had significant progressive bilateral foot drops with weakness in her lower extremities. EMGs and nerve conduction studies have demonstrated this finding she had a degenerative spondylolisthesis that was decompressed several months ago despite this she's had progressive worsening and weakness and continued problems with pain. She's been advised regarding the need for a myelogram and is now admitted for this process.  Pre op Dx: Lumbar radiculopathy with bilateral foot drops Post op Dx: Same Procedure: Lumbar myelogram Surgeon: Jaquil Todt Puncture level: L2-3 Fluid color: Clear colorless Injection: Iohexol 180. 12 mL Findings: Severe stenosis at L2-3 moderate stenosis at L3-4. Further evaluation with myelography

## 2016-06-01 DIAGNOSIS — M5416 Radiculopathy, lumbar region: Secondary | ICD-10-CM | POA: Diagnosis not present

## 2016-06-01 DIAGNOSIS — M4316 Spondylolisthesis, lumbar region: Secondary | ICD-10-CM | POA: Diagnosis not present

## 2016-06-08 ENCOUNTER — Other Ambulatory Visit: Payer: Self-pay | Admitting: Neurological Surgery

## 2016-06-19 DIAGNOSIS — M62551 Muscle wasting and atrophy, not elsewhere classified, right thigh: Secondary | ICD-10-CM | POA: Diagnosis not present

## 2016-06-27 ENCOUNTER — Encounter (HOSPITAL_COMMUNITY): Payer: Self-pay

## 2016-06-27 ENCOUNTER — Encounter (HOSPITAL_COMMUNITY)
Admission: RE | Admit: 2016-06-27 | Discharge: 2016-06-27 | Disposition: A | Discharge status: Home / Self Care | Payer: PPO | Source: Ambulatory Visit | Attending: Neurological Surgery | Admitting: Neurological Surgery

## 2016-06-27 ENCOUNTER — Other Ambulatory Visit: Payer: Self-pay

## 2016-06-27 DIAGNOSIS — Z8541 Personal history of malignant neoplasm of cervix uteri: Secondary | ICD-10-CM | POA: Diagnosis not present

## 2016-06-27 DIAGNOSIS — Z7984 Long term (current) use of oral hypoglycemic drugs: Secondary | ICD-10-CM | POA: Diagnosis not present

## 2016-06-27 DIAGNOSIS — Z7982 Long term (current) use of aspirin: Secondary | ICD-10-CM | POA: Diagnosis not present

## 2016-06-27 DIAGNOSIS — E7800 Pure hypercholesterolemia, unspecified: Secondary | ICD-10-CM | POA: Diagnosis not present

## 2016-06-27 DIAGNOSIS — Z01818 Encounter for other preprocedural examination: Secondary | ICD-10-CM | POA: Diagnosis not present

## 2016-06-27 DIAGNOSIS — Z981 Arthrodesis status: Secondary | ICD-10-CM | POA: Insufficient documentation

## 2016-06-27 DIAGNOSIS — M4316 Spondylolisthesis, lumbar region: Secondary | ICD-10-CM | POA: Insufficient documentation

## 2016-06-27 DIAGNOSIS — I10 Essential (primary) hypertension: Secondary | ICD-10-CM | POA: Diagnosis not present

## 2016-06-27 DIAGNOSIS — Z96653 Presence of artificial knee joint, bilateral: Secondary | ICD-10-CM | POA: Diagnosis not present

## 2016-06-27 DIAGNOSIS — E1142 Type 2 diabetes mellitus with diabetic polyneuropathy: Secondary | ICD-10-CM | POA: Insufficient documentation

## 2016-06-27 DIAGNOSIS — Z0183 Encounter for blood typing: Secondary | ICD-10-CM | POA: Insufficient documentation

## 2016-06-27 DIAGNOSIS — Z79899 Other long term (current) drug therapy: Secondary | ICD-10-CM | POA: Insufficient documentation

## 2016-06-27 DIAGNOSIS — K219 Gastro-esophageal reflux disease without esophagitis: Secondary | ICD-10-CM | POA: Diagnosis not present

## 2016-06-27 DIAGNOSIS — Z01812 Encounter for preprocedural laboratory examination: Secondary | ICD-10-CM | POA: Insufficient documentation

## 2016-06-27 HISTORY — DX: Other skin changes: R23.8

## 2016-06-27 HISTORY — DX: Dorsalgia, unspecified: M54.9

## 2016-06-27 HISTORY — DX: Other reaction to spinal and lumbar puncture: G97.1

## 2016-06-27 HISTORY — DX: Gastro-esophageal reflux disease without esophagitis: K21.9

## 2016-06-27 HISTORY — DX: Other chronic pain: G89.29

## 2016-06-27 HISTORY — DX: Spontaneous ecchymoses: R23.3

## 2016-06-27 HISTORY — DX: Unspecified cataract: H26.9

## 2016-06-27 LAB — CBC
HEMATOCRIT: 36.4 % (ref 36.0–46.0)
HEMOGLOBIN: 11.2 g/dL — AB (ref 12.0–15.0)
MCH: 26.8 pg (ref 26.0–34.0)
MCHC: 30.8 g/dL (ref 30.0–36.0)
MCV: 87.1 fL (ref 78.0–100.0)
Platelets: 304 10*3/uL (ref 150–400)
RBC: 4.18 MIL/uL (ref 3.87–5.11)
RDW: 15 % (ref 11.5–15.5)
WBC: 8.3 10*3/uL (ref 4.0–10.5)

## 2016-06-27 LAB — BASIC METABOLIC PANEL
ANION GAP: 13 (ref 5–15)
BUN: 18 mg/dL (ref 6–20)
CHLORIDE: 101 mmol/L (ref 101–111)
CO2: 25 mmol/L (ref 22–32)
CREATININE: 1.23 mg/dL — AB (ref 0.44–1.00)
Calcium: 9.3 mg/dL (ref 8.9–10.3)
GFR calc non Af Amer: 45 mL/min — ABNORMAL LOW (ref >60)
GFR, EST AFRICAN AMERICAN: 52 mL/min — AB (ref >60)
Glucose, Bld: 174 mg/dL — ABNORMAL HIGH (ref 65–99)
POTASSIUM: 3.4 mmol/L — AB (ref 3.5–5.1)
SODIUM: 139 mmol/L (ref 135–145)

## 2016-06-27 LAB — SURGICAL PCR SCREEN
MRSA, PCR: NEGATIVE
Staphylococcus aureus: NEGATIVE

## 2016-06-27 LAB — GLUCOSE, CAPILLARY: Glucose-Capillary: 179 mg/dL — ABNORMAL HIGH (ref 65–99)

## 2016-06-27 MED ORDER — CHLORHEXIDINE GLUCONATE CLOTH 2 % EX PADS: 6.0000 | MEDICATED_PAD | Freq: Once | CUTANEOUS | Status: DC

## 2016-06-27 NOTE — Pre-Procedure Instructions (Signed)
CARLISSA BUTTERLY  06/27/2016      [facility] - [place], [place], [place] [address] Phone: [phone] Fax: [phone]    Your procedure is scheduled on Mon, Sept 11 @ 7:30 AM  Report to [facility] at 5:30 AM  Call this number if you have problems the morning of surgery:  [phone]   Remember:  Do not eat food or drink liquids after midnight.  Take these medicines the morning of surgery with A SIP OF WATER Allopurinol(Zyloprim), Amlodipine(Norvasc),Metoprolol(Lopressor), and Omeprazole(Prilosec)            Stop taking your Aspirin,Mobic,Ibuprofen,Fish Oil,Turmeric,along with any Vitamins or Herbal Medications. No Goody's,BC's,Aleve,Advil,or Motrin.      How to Manage Your Diabetes Before and After Surgery  Why is it important to control my blood sugar before and after surgery? . Improving blood sugar levels before and after surgery helps healing and can limit problems. . A way of improving blood sugar control is eating a healthy diet by: o  Eating less sugar and carbohydrates o  Increasing activity/exercise o  Talking with your doctor about reaching your blood sugar goals . High blood sugars (greater than 180 mg/dL) can raise your risk of infections and slow your recovery, so you will need to focus on controlling your diabetes during the weeks before surgery. . Make sure that the doctor who takes care of your diabetes knows about your planned surgery including the date and location.  How do I manage my blood sugar before surgery? . Check your blood sugar at least 4 times a day, starting 2 days before surgery, to make sure that the level is not too high or low. o Check your blood sugar the morning of your surgery when you wake up and every 2 hours until you get to the Short Stay unit. . If your blood sugar is less than 70 mg/dL, you will need to treat for low blood sugar: o Do not take insulin. o Treat  a low blood sugar (less than 70 mg/dL) with  cup of clear juice (cranberry or apple), 4 glucose tablets, OR glucose gel. o Recheck blood sugar in 15 minutes after treatment (to make sure it is greater than 70 mg/dL). If your blood sugar is not greater than 70 mg/dL on recheck, call [phone] for further instructions. . Report your blood sugar to the short stay nurse when you get to Short Stay.  . If you are admitted to the hospital after surgery: o Your blood sugar will be checked by the staff and you will probably be given insulin after surgery (instead of oral diabetes medicines) to make sure you have good blood sugar levels. o The goal for blood sugar control after surgery is 80-180 mg/dL.              WHAT DO I DO ABOUT MY DIABETES MEDICATION?   Marland Kitchen Do not take oral diabetes medicines (pills) the morning of surgery.        . The day of surgery, do not take other diabetes injectables, including Byetta (exenatide), Bydureon (exenatide ER), Victoza (liraglutide), or Trulicity (dulaglutide).  . If your CBG is greater than 220 mg/dL, you may take  of your sliding scale (correction) dose of insulin.  Other Instructions:          Patient Signature:  Date:   Nurse Signature:  Date:   Reviewed and Endorsed by [facility]  Health Patient Education Committee, August 2015   Do not wear jewelry, make-up or nail polish.  Do not wear lotions, powders, or perfumes, or deoderant.  Do not shave 48 hours prior to surgery.     Do not bring valuables to the hospital.  [facility] is not responsible for any belongings or valuables.  Contacts, dentures or bridgework may not be worn into surgery.  Leave your suitcase in the car.  After surgery it may be brought to your room.  For patients admitted to the hospital, discharge time will be determined by your treatment team.  Patients discharged the day of surgery will not be allowed to drive home.    [facility] -  Preparing for [facility]  Before surgery, you can play an important role.  Because skin is not sterile, your skin needs to be as free of germs as possible.  You can reduce the number of germs on you skin by washing with CHG (chlorahexidine gluconate) soap before surgery.  CHG is an antiseptic cleaner which kills germs and bonds with the skin to continue killing germs even after washing.  Please DO NOT use if you have an allergy to CHG or antibacterial soaps.  If your skin becomes reddened/irritated stop using the CHG and inform your nurse when you arrive at Short Stay.  Do not shave (including legs and underarms) for at least 48 hours prior to the first CHG shower.  You may shave your face.  Please follow these instructions carefully:   1.  Shower with CHG Soap the night before surgery and the                                morning of Surgery.  2.  If you choose to wash your hair, wash your hair first as usual with your       normal shampoo.  3.  After you shampoo, rinse your hair and body thoroughly to remove the                      Shampoo.  4.  Use CHG as you would any other liquid soap.  You can apply chg directly       to the skin and wash gently with scrungie or a clean washcloth.  5.  Apply the CHG Soap to your body ONLY FROM THE NECK DOWN.        Do not use on open wounds or open sores.  Avoid contact with your eyes,       ears, mouth and genitals (private parts).  Wash genitals (private parts)       with your normal soap.  6.  Wash thoroughly, paying special attention to the area where your surgery        will be performed.  7.  Thoroughly rinse your body with warm water from the neck down.  8.  DO NOT shower/wash with your normal soap after using and rinsing off       the CHG Soap.  9.  Pat yourself dry with a clean towel.            10.  Wear clean pajamas.            11.  Place clean sheets on your bed the night of your first shower and do not        sleep with pets.  Day of  Surgery  Do not apply any lotions/deoderants the morning of surgery.  Please wear clean clothes to the hospital/surgery center.    Please read over the following fact sheets that you were given. Pain Booklet, Coughing and Deep Breathing and MRSA Information

## 2016-06-27 NOTE — Progress Notes (Signed)
Anesthesia Chart Review: Patient is a 65 year old female scheduled for PLIF L2-3, L3-4 tie into L4-S1 fusion on 07/03/16 (first case) by Dr. Ellene Route.  History includes never smoker, post-operative N/V, spinal headache, DM2, peripheral neuropathy, HTN, hypercholesterolemia, GERD, cervical cancer, bilateral foot drop, gout, bruises easily, chronic back pain, cholecystectomy, hysterectomy, bilateral TKA, L4-S1 fusion 11/23/15.   PCP is Dr. Janace Litten. Patient lives in [place], [place] (about 40 minute drive from [facility]).  Meds include allopurinol, amlodipine, ASA 81 mg, Lipitor, HCTZ, Salonpas, metformin, Lopressor, fish oil, Prilosec, Valtrex, turmeric.  BP (!) 149/69   Pulse 75   Temp 36.5 C   Resp 20   Ht 5\' 5"  (1.651 m)   Wt 228 lb 3.2 oz (103.5 kg)   SpO2 98%   BMI 37.97 kg/m   06/27/16 EKG: Undetermined rhythm due to excess artifact. Non-specific T wave abnormality. Rhythm does appear to be regular, but difficult to assess for p wave due to artifact. (I think lead III does show p waves, so am inclined to think she is in SR. Tracings also reviewed with anesthesiologist Dr. Ermalene Postin.)  There is a comparison tracing from 03/18/15 scanned under the Media tab, Correspondence 11/23/15 that shows underlying SR. (She reports stress test > 10 years ago. Denied history of echo or cath.) She will need either a repeat EKG or rhythm strip on arrival to re-evaluate rhythm.   Preoperative labs noted. Cr 1.23, stable. Glucose 174. H/H 11.2/36.4. T&S done. A1c is pending.  She tolerated lumbar fusion earlier this year. If no concerning arrhythmias on repeat telemetry/EKG evaluation day of surgery or other changes then would anticipate that she could proceed as planned.  George Hugh [facility]/Anesthesiology Phone [phone] 06/27/2016 5:41 PM

## 2016-06-27 NOTE — Progress Notes (Addendum)
Cardiologist denies  Medical Md is Dr.Robert Unk Lightning  Echo denies  Stress test done at least 10 yrs ago  Heart cath denies  EKG little over a yr ago  CXR denies in past yr

## 2016-06-28 LAB — HEMOGLOBIN A1C
HEMOGLOBIN A1C: 7.1 % — AB (ref 4.8–5.6)
Mean Plasma Glucose: 157 mg/dL

## 2016-07-02 MED ORDER — CEFAZOLIN SODIUM-DEXTROSE 2-4 GM/100ML-% IV SOLN
2.0000 g | INTRAVENOUS | Status: AC
  Administered 2016-07-03 (×2): 2 g via INTRAVENOUS
  Filled 2016-07-02: qty 100

## 2016-07-03 ENCOUNTER — Encounter (HOSPITAL_COMMUNITY)
Admission: RE | Disposition: A | Discharge status: Rehabilitation Facility | Payer: Self-pay | Source: Ambulatory Visit | Attending: Neurological Surgery

## 2016-07-03 ENCOUNTER — Encounter (HOSPITAL_COMMUNITY): Payer: Self-pay | Admitting: General Practice

## 2016-07-03 ENCOUNTER — Inpatient Hospital Stay (HOSPITAL_COMMUNITY): Payer: PPO | Admitting: Vascular Surgery

## 2016-07-03 ENCOUNTER — Inpatient Hospital Stay (HOSPITAL_COMMUNITY)
Admission: RE | Admit: 2016-07-03 | Discharge: 2016-07-07 | DRG: 460 | Disposition: A | Discharge status: Rehabilitation Facility | Payer: PPO | Source: Ambulatory Visit | Attending: Neurological Surgery | Admitting: Neurological Surgery

## 2016-07-03 ENCOUNTER — Inpatient Hospital Stay (HOSPITAL_COMMUNITY): Payer: PPO

## 2016-07-03 DIAGNOSIS — Z888 Allergy status to other drugs, medicaments and biological substances status: Secondary | ICD-10-CM | POA: Diagnosis not present

## 2016-07-03 DIAGNOSIS — Z7984 Long term (current) use of oral hypoglycemic drugs: Secondary | ICD-10-CM

## 2016-07-03 DIAGNOSIS — Z791 Long term (current) use of non-steroidal anti-inflammatories (NSAID): Secondary | ICD-10-CM | POA: Diagnosis not present

## 2016-07-03 DIAGNOSIS — M4316 Spondylolisthesis, lumbar region: Secondary | ICD-10-CM | POA: Diagnosis not present

## 2016-07-03 DIAGNOSIS — Z6838 Body mass index (BMI) 38.0-38.9, adult: Secondary | ICD-10-CM

## 2016-07-03 DIAGNOSIS — R197 Diarrhea, unspecified: Secondary | ICD-10-CM | POA: Diagnosis not present

## 2016-07-03 DIAGNOSIS — D62 Acute posthemorrhagic anemia: Secondary | ICD-10-CM | POA: Diagnosis not present

## 2016-07-03 DIAGNOSIS — R300 Dysuria: Secondary | ICD-10-CM | POA: Diagnosis not present

## 2016-07-03 DIAGNOSIS — I10 Essential (primary) hypertension: Secondary | ICD-10-CM | POA: Diagnosis present

## 2016-07-03 DIAGNOSIS — Z87440 Personal history of urinary (tract) infections: Secondary | ICD-10-CM

## 2016-07-03 DIAGNOSIS — M4806 Spinal stenosis, lumbar region: Secondary | ICD-10-CM | POA: Diagnosis present

## 2016-07-03 DIAGNOSIS — M5116 Intervertebral disc disorders with radiculopathy, lumbar region: Secondary | ICD-10-CM | POA: Diagnosis present

## 2016-07-03 DIAGNOSIS — I959 Hypotension, unspecified: Secondary | ICD-10-CM | POA: Diagnosis not present

## 2016-07-03 DIAGNOSIS — Z7982 Long term (current) use of aspirin: Secondary | ICD-10-CM | POA: Diagnosis not present

## 2016-07-03 DIAGNOSIS — Z96622 Presence of left artificial elbow joint: Secondary | ICD-10-CM | POA: Diagnosis not present

## 2016-07-03 DIAGNOSIS — M5136 Other intervertebral disc degeneration, lumbar region: Secondary | ICD-10-CM | POA: Diagnosis not present

## 2016-07-03 DIAGNOSIS — M109 Gout, unspecified: Secondary | ICD-10-CM | POA: Diagnosis present

## 2016-07-03 DIAGNOSIS — Z419 Encounter for procedure for purposes other than remedying health state, unspecified: Secondary | ICD-10-CM

## 2016-07-03 DIAGNOSIS — Z452 Encounter for adjustment and management of vascular access device: Secondary | ICD-10-CM | POA: Diagnosis not present

## 2016-07-03 DIAGNOSIS — M21372 Foot drop, left foot: Secondary | ICD-10-CM | POA: Diagnosis present

## 2016-07-03 DIAGNOSIS — Z23 Encounter for immunization: Secondary | ICD-10-CM | POA: Diagnosis not present

## 2016-07-03 DIAGNOSIS — K219 Gastro-esophageal reflux disease without esophagitis: Secondary | ICD-10-CM | POA: Diagnosis present

## 2016-07-03 DIAGNOSIS — M4726 Other spondylosis with radiculopathy, lumbar region: Secondary | ICD-10-CM | POA: Diagnosis not present

## 2016-07-03 DIAGNOSIS — G589 Mononeuropathy, unspecified: Secondary | ICD-10-CM | POA: Diagnosis not present

## 2016-07-03 DIAGNOSIS — E1142 Type 2 diabetes mellitus with diabetic polyneuropathy: Secondary | ICD-10-CM | POA: Diagnosis not present

## 2016-07-03 DIAGNOSIS — M48061 Spinal stenosis, lumbar region without neurogenic claudication: Secondary | ICD-10-CM | POA: Diagnosis present

## 2016-07-03 DIAGNOSIS — M21371 Foot drop, right foot: Secondary | ICD-10-CM | POA: Diagnosis not present

## 2016-07-03 DIAGNOSIS — M545 Low back pain: Secondary | ICD-10-CM | POA: Diagnosis not present

## 2016-07-03 DIAGNOSIS — J9811 Atelectasis: Secondary | ICD-10-CM | POA: Diagnosis not present

## 2016-07-03 DIAGNOSIS — E7800 Pure hypercholesterolemia, unspecified: Secondary | ICD-10-CM | POA: Diagnosis not present

## 2016-07-03 DIAGNOSIS — M5416 Radiculopathy, lumbar region: Secondary | ICD-10-CM | POA: Diagnosis not present

## 2016-07-03 DIAGNOSIS — Z8739 Personal history of other diseases of the musculoskeletal system and connective tissue: Secondary | ICD-10-CM | POA: Diagnosis not present

## 2016-07-03 DIAGNOSIS — Z79899 Other long term (current) drug therapy: Secondary | ICD-10-CM | POA: Diagnosis not present

## 2016-07-03 DIAGNOSIS — Z96653 Presence of artificial knee joint, bilateral: Secondary | ICD-10-CM | POA: Diagnosis not present

## 2016-07-03 DIAGNOSIS — Z981 Arthrodesis status: Secondary | ICD-10-CM | POA: Diagnosis not present

## 2016-07-03 LAB — GLUCOSE, CAPILLARY: Glucose-Capillary: 180 mg/dL — ABNORMAL HIGH (ref 65–99)

## 2016-07-03 SURGERY — POSTERIOR LUMBAR FUSION 2 LEVEL
Anesthesia: General | Site: Spine Lumbar

## 2016-07-03 MED ORDER — METHOCARBAMOL 500 MG PO TABS
500.0000 mg | ORAL_TABLET | Freq: Four times a day (QID) | ORAL | Status: DC | PRN
  Administered 2016-07-03 – 2016-07-07 (×9): 500 mg via ORAL
  Filled 2016-07-03 (×10): qty 1

## 2016-07-03 MED ORDER — METOPROLOL TARTRATE 50 MG PO TABS
50.0000 mg | ORAL_TABLET | Freq: Two times a day (BID) | ORAL | Status: DC
  Administered 2016-07-03 – 2016-07-07 (×7): 50 mg via ORAL
  Filled 2016-07-03 (×8): qty 1

## 2016-07-03 MED ORDER — SODIUM CHLORIDE 0.9% FLUSH: 3.0000 mL | INTRAVENOUS | Status: DC | PRN

## 2016-07-03 MED ORDER — BACITRACIN 50000 UNITS IM SOLR
INTRAMUSCULAR | Status: DC | PRN
  Administered 2016-07-03: 500 mL

## 2016-07-03 MED ORDER — SCOPOLAMINE 1 MG/3DAYS TD PT72
MEDICATED_PATCH | TRANSDERMAL | Status: DC | PRN
  Administered 2016-07-03: 1 via TRANSDERMAL

## 2016-07-03 MED ORDER — LIDOCAINE HCL (CARDIAC) 20 MG/ML IV SOLN
INTRAVENOUS | Status: DC | PRN
  Administered 2016-07-03: 100 mg via INTRAVENOUS

## 2016-07-03 MED ORDER — SENNA 8.6 MG PO TABS
1.0000 | ORAL_TABLET | Freq: Two times a day (BID) | ORAL | Status: DC
  Administered 2016-07-03 – 2016-07-06 (×6): 8.6 mg via ORAL
  Filled 2016-07-03 (×8): qty 1

## 2016-07-03 MED ORDER — LACTATED RINGERS IV SOLN
INTRAVENOUS | Status: DC | PRN
  Administered 2016-07-03 (×3): via INTRAVENOUS

## 2016-07-03 MED ORDER — PROPOFOL 10 MG/ML IV BOLUS
INTRAVENOUS | Status: AC
  Filled 2016-07-03: qty 20

## 2016-07-03 MED ORDER — ALBUMIN HUMAN 5 % IV SOLN
INTRAVENOUS | Status: DC | PRN
  Administered 2016-07-03 (×2): via INTRAVENOUS

## 2016-07-03 MED ORDER — MIDAZOLAM HCL 5 MG/5ML IJ SOLN
INTRAMUSCULAR | Status: DC | PRN
  Administered 2016-07-03: 2 mg via INTRAVENOUS

## 2016-07-03 MED ORDER — MENTHOL 3 MG MT LOZG
1.0000 | LOZENGE | OROMUCOSAL | Status: DC | PRN
  Administered 2016-07-05: 3 mg via ORAL
  Filled 2016-07-03: qty 9

## 2016-07-03 MED ORDER — METOCLOPRAMIDE HCL 5 MG/ML IJ SOLN
INTRAMUSCULAR | Status: DC | PRN
  Administered 2016-07-03: 10 mg via INTRAVENOUS

## 2016-07-03 MED ORDER — ONDANSETRON HCL 4 MG/2ML IJ SOLN
4.0000 mg | INTRAMUSCULAR | Status: DC | PRN
  Administered 2016-07-07 (×2): 4 mg via INTRAVENOUS
  Filled 2016-07-03 (×2): qty 2

## 2016-07-03 MED ORDER — SUGAMMADEX SODIUM 200 MG/2ML IV SOLN
INTRAVENOUS | Status: DC | PRN
  Administered 2016-07-03: 300 mg via INTRAVENOUS

## 2016-07-03 MED ORDER — POLYETHYLENE GLYCOL 3350 17 G PO PACK: 17.0000 g | PACK | Freq: Every day | ORAL | Status: DC | PRN

## 2016-07-03 MED ORDER — ALUM & MAG HYDROXIDE-SIMETH 200-200-20 MG/5ML PO SUSP: 30.0000 mL | Freq: Four times a day (QID) | ORAL | Status: DC | PRN

## 2016-07-03 MED ORDER — SODIUM CHLORIDE 0.9 % IV SOLN
INTRAVENOUS | Status: DC
  Administered 2016-07-03 – 2016-07-04 (×2): via INTRAVENOUS

## 2016-07-03 MED ORDER — ONDANSETRON HCL 4 MG/2ML IJ SOLN: 4.0000 mg | Freq: Once | INTRAMUSCULAR | Status: DC | PRN

## 2016-07-03 MED ORDER — FENTANYL CITRATE (PF) 100 MCG/2ML IJ SOLN
INTRAMUSCULAR | Status: AC
  Filled 2016-07-03: qty 4

## 2016-07-03 MED ORDER — BUPIVACAINE HCL (PF) 0.5 % IJ SOLN
INTRAMUSCULAR | Status: DC | PRN
  Administered 2016-07-03: 5 mL
  Administered 2016-07-03: 25 mL

## 2016-07-03 MED ORDER — KETOROLAC TROMETHAMINE 15 MG/ML IJ SOLN: 15.0000 mg | Freq: Once | INTRAMUSCULAR | Status: DC

## 2016-07-03 MED ORDER — EPHEDRINE 5 MG/ML INJ
INTRAVENOUS | Status: AC
  Filled 2016-07-03: qty 10

## 2016-07-03 MED ORDER — HYDROCHLOROTHIAZIDE 25 MG PO TABS
25.0000 mg | ORAL_TABLET | Freq: Every day | ORAL | Status: DC
  Administered 2016-07-05 – 2016-07-07 (×2): 25 mg via ORAL
  Filled 2016-07-03 (×3): qty 1

## 2016-07-03 MED ORDER — ONDANSETRON HCL 4 MG/2ML IJ SOLN
INTRAMUSCULAR | Status: AC
  Filled 2016-07-03: qty 2

## 2016-07-03 MED ORDER — MEPERIDINE HCL 25 MG/ML IJ SOLN: 6.2500 mg | INTRAMUSCULAR | Status: DC | PRN

## 2016-07-03 MED ORDER — PANTOPRAZOLE SODIUM 40 MG PO TBEC
40.0000 mg | DELAYED_RELEASE_TABLET | Freq: Every day | ORAL | Status: DC
  Administered 2016-07-04 – 2016-07-07 (×4): 40 mg via ORAL
  Filled 2016-07-03 (×5): qty 1

## 2016-07-03 MED ORDER — DOCUSATE SODIUM 100 MG PO CAPS
100.0000 mg | ORAL_CAPSULE | Freq: Two times a day (BID) | ORAL | Status: DC
  Administered 2016-07-03 – 2016-07-06 (×6): 100 mg via ORAL
  Filled 2016-07-03 (×8): qty 1

## 2016-07-03 MED ORDER — METOCLOPRAMIDE HCL 5 MG/ML IJ SOLN
INTRAMUSCULAR | Status: AC
  Filled 2016-07-03: qty 2

## 2016-07-03 MED ORDER — FLEET ENEMA 7-19 GM/118ML RE ENEM: 1.0000 | ENEMA | Freq: Once | RECTAL | Status: DC | PRN

## 2016-07-03 MED ORDER — OXYCODONE-ACETAMINOPHEN 5-325 MG PO TABS
1.0000 | ORAL_TABLET | ORAL | Status: DC | PRN
  Administered 2016-07-03 – 2016-07-07 (×18): 2 via ORAL
  Filled 2016-07-03 (×18): qty 2

## 2016-07-03 MED ORDER — BISACODYL 10 MG RE SUPP: 10.0000 mg | Freq: Every day | RECTAL | Status: DC | PRN

## 2016-07-03 MED ORDER — THROMBIN 20000 UNITS EX SOLR
CUTANEOUS | Status: DC | PRN
  Administered 2016-07-03: 20 mL via TOPICAL

## 2016-07-03 MED ORDER — ACETAMINOPHEN 650 MG RE SUPP: 650.0000 mg | RECTAL | Status: DC | PRN

## 2016-07-03 MED ORDER — LIDOCAINE 2% (20 MG/ML) 5 ML SYRINGE
INTRAMUSCULAR | Status: AC
  Filled 2016-07-03: qty 5

## 2016-07-03 MED ORDER — CEFAZOLIN IN D5W 1 GM/50ML IV SOLN
1.0000 g | Freq: Three times a day (TID) | INTRAVENOUS | Status: AC
  Administered 2016-07-03 – 2016-07-04 (×2): 1 g via INTRAVENOUS
  Filled 2016-07-03 (×2): qty 50

## 2016-07-03 MED ORDER — HYDROMORPHONE HCL 1 MG/ML IJ SOLN
0.5000 mg | INTRAMUSCULAR | Status: DC | PRN
  Administered 2016-07-03 – 2016-07-07 (×6): 1 mg via INTRAVENOUS
  Filled 2016-07-03 (×6): qty 1

## 2016-07-03 MED ORDER — FENTANYL CITRATE (PF) 100 MCG/2ML IJ SOLN
INTRAMUSCULAR | Status: DC | PRN
  Administered 2016-07-03: 100 ug via INTRAVENOUS
  Administered 2016-07-03: 200 ug via INTRAVENOUS
  Administered 2016-07-03 (×4): 50 ug via INTRAVENOUS
  Administered 2016-07-03: 100 ug via INTRAVENOUS
  Administered 2016-07-03 (×2): 50 ug via INTRAVENOUS

## 2016-07-03 MED ORDER — EPHEDRINE SULFATE 50 MG/ML IJ SOLN
INTRAMUSCULAR | Status: DC | PRN
  Administered 2016-07-03 (×2): 5 mg via INTRAVENOUS
  Administered 2016-07-03: 10 mg via INTRAVENOUS
  Administered 2016-07-03: 5 mg via INTRAVENOUS

## 2016-07-03 MED ORDER — ALLOPURINOL 100 MG PO TABS
300.0000 mg | ORAL_TABLET | Freq: Every day | ORAL | Status: DC
  Administered 2016-07-04 – 2016-07-07 (×4): 300 mg via ORAL
  Filled 2016-07-03 (×4): qty 3

## 2016-07-03 MED ORDER — PHENOL 1.4 % MT LIQD: 1.0000 | OROMUCOSAL | Status: DC | PRN

## 2016-07-03 MED ORDER — THROMBIN 5000 UNITS EX SOLR
CUTANEOUS | Status: DC | PRN
  Administered 2016-07-03: 5 mL via TOPICAL

## 2016-07-03 MED ORDER — SODIUM CHLORIDE 0.9 % IV SOLN: 250.0000 mL | INTRAVENOUS | Status: DC

## 2016-07-03 MED ORDER — KETOROLAC TROMETHAMINE 15 MG/ML IJ SOLN
INTRAMUSCULAR | Status: AC
  Administered 2016-07-03: 15 mg
  Filled 2016-07-03: qty 1

## 2016-07-03 MED ORDER — ATORVASTATIN CALCIUM 10 MG PO TABS
10.0000 mg | ORAL_TABLET | Freq: Every day | ORAL | Status: DC
  Administered 2016-07-03 – 2016-07-07 (×5): 10 mg via ORAL
  Filled 2016-07-03 (×6): qty 1

## 2016-07-03 MED ORDER — DEXAMETHASONE SODIUM PHOSPHATE 4 MG/ML IJ SOLN
INTRAMUSCULAR | Status: DC | PRN
  Administered 2016-07-03: 4 mg via INTRAVENOUS

## 2016-07-03 MED ORDER — PHENYLEPHRINE 40 MCG/ML (10ML) SYRINGE FOR IV PUSH (FOR BLOOD PRESSURE SUPPORT)
PREFILLED_SYRINGE | INTRAVENOUS | Status: AC
  Filled 2016-07-03: qty 10

## 2016-07-03 MED ORDER — PHENYLEPHRINE HCL 10 MG/ML IJ SOLN
INTRAMUSCULAR | Status: DC | PRN
  Administered 2016-07-03 (×5): 80 ug via INTRAVENOUS

## 2016-07-03 MED ORDER — MIDAZOLAM HCL 2 MG/2ML IJ SOLN
INTRAMUSCULAR | Status: AC
  Filled 2016-07-03: qty 2

## 2016-07-03 MED ORDER — ROCURONIUM BROMIDE 100 MG/10ML IV SOLN
INTRAVENOUS | Status: DC | PRN
  Administered 2016-07-03 (×2): 50 mg via INTRAVENOUS

## 2016-07-03 MED ORDER — METFORMIN HCL 500 MG PO TABS
1000.0000 mg | ORAL_TABLET | Freq: Two times a day (BID) | ORAL | Status: DC
  Administered 2016-07-03 – 2016-07-07 (×8): 1000 mg via ORAL
  Filled 2016-07-03 (×8): qty 2

## 2016-07-03 MED ORDER — HYDROMORPHONE HCL 1 MG/ML IJ SOLN
0.2500 mg | INTRAMUSCULAR | Status: DC | PRN
  Administered 2016-07-03 (×2): 0.5 mg via INTRAVENOUS

## 2016-07-03 MED ORDER — ACETAMINOPHEN 325 MG PO TABS: 650.0000 mg | ORAL_TABLET | ORAL | Status: DC | PRN

## 2016-07-03 MED ORDER — DEXAMETHASONE SODIUM PHOSPHATE 10 MG/ML IJ SOLN
INTRAMUSCULAR | Status: AC
  Filled 2016-07-03: qty 1

## 2016-07-03 MED ORDER — SCOPOLAMINE 1 MG/3DAYS TD PT72
MEDICATED_PATCH | TRANSDERMAL | Status: AC
  Filled 2016-07-03: qty 1

## 2016-07-03 MED ORDER — FENTANYL CITRATE (PF) 100 MCG/2ML IJ SOLN
INTRAMUSCULAR | Status: AC
  Filled 2016-07-03: qty 6

## 2016-07-03 MED ORDER — PHENYLEPHRINE HCL 10 MG/ML IJ SOLN
INTRAVENOUS | Status: DC | PRN
  Administered 2016-07-03: 20 ug/min via INTRAVENOUS

## 2016-07-03 MED ORDER — AMLODIPINE BESYLATE 10 MG PO TABS
10.0000 mg | ORAL_TABLET | Freq: Every day | ORAL | Status: DC
Start: 2016-07-04 — End: 2016-07-07
  Administered 2016-07-05 – 2016-07-07 (×2): 10 mg via ORAL
  Filled 2016-07-03 (×3): qty 1

## 2016-07-03 MED ORDER — METHOCARBAMOL 1000 MG/10ML IJ SOLN
500.0000 mg | Freq: Four times a day (QID) | INTRAVENOUS | Status: DC | PRN
  Filled 2016-07-03: qty 5

## 2016-07-03 MED ORDER — ROCURONIUM BROMIDE 10 MG/ML (PF) SYRINGE
PREFILLED_SYRINGE | INTRAVENOUS | Status: AC
  Filled 2016-07-03: qty 10

## 2016-07-03 MED ORDER — DIPHENHYDRAMINE HCL 50 MG/ML IJ SOLN
INTRAMUSCULAR | Status: AC
  Filled 2016-07-03: qty 1

## 2016-07-03 MED ORDER — HYDROMORPHONE HCL 1 MG/ML IJ SOLN
INTRAMUSCULAR | Status: AC
  Filled 2016-07-03: qty 1

## 2016-07-03 MED ORDER — DIPHENHYDRAMINE HCL 50 MG/ML IJ SOLN
INTRAMUSCULAR | Status: DC | PRN
  Administered 2016-07-03: 12.5 mg via INTRAVENOUS

## 2016-07-03 MED ORDER — LIDOCAINE-EPINEPHRINE 2 %-1:100000 IJ SOLN
INTRAMUSCULAR | Status: DC | PRN
  Administered 2016-07-03: 5 mL

## 2016-07-03 MED ORDER — 0.9 % SODIUM CHLORIDE (POUR BTL) OPTIME
TOPICAL | Status: DC | PRN
  Administered 2016-07-03: 1000 mL

## 2016-07-03 MED ORDER — PROPOFOL 10 MG/ML IV BOLUS
INTRAVENOUS | Status: DC | PRN
  Administered 2016-07-03: 140 mg via INTRAVENOUS

## 2016-07-03 MED ORDER — SODIUM CHLORIDE 0.9% FLUSH
3.0000 mL | Freq: Two times a day (BID) | INTRAVENOUS | Status: DC
  Administered 2016-07-03 – 2016-07-07 (×7): 3 mL via INTRAVENOUS

## 2016-07-03 MED ORDER — VALACYCLOVIR HCL 500 MG PO TABS: 1000.0000 mg | ORAL_TABLET | Freq: Every day | ORAL | Status: DC | PRN

## 2016-07-03 MED ORDER — ONDANSETRON HCL 4 MG/2ML IJ SOLN
INTRAMUSCULAR | Status: DC | PRN
  Administered 2016-07-03: 4 mg via INTRAVENOUS

## 2016-07-03 SURGICAL SUPPLY — 74 items
BAG DECANTER FOR FLEXI CONT (MISCELLANEOUS) ×3 IMPLANT
BLADE CLIPPER SURG (BLADE) IMPLANT
BUR MATCHSTICK NEURO 3.0 LAGG (BURR) ×3 IMPLANT
CANISTER SUCT 3000ML PPV (MISCELLANEOUS) ×3 IMPLANT
CONN ADJ TRANSVERSE 41-51X5.5 (Connector) ×3 IMPLANT
CONNECTOR ADJ TRNSVR 41-51X5.5 (Connector) ×1 IMPLANT
CONT SPEC 4OZ CLIKSEAL STRL BL (MISCELLANEOUS) ×3 IMPLANT
COVER BACK TABLE 60X90IN (DRAPES) ×6 IMPLANT
DECANTER SPIKE VIAL GLASS SM (MISCELLANEOUS) ×3 IMPLANT
DERMABOND ADVANCED (GAUZE/BANDAGES/DRESSINGS) ×2
DERMABOND ADVANCED .7 DNX12 (GAUZE/BANDAGES/DRESSINGS) ×1 IMPLANT
DEVICE DISSECT PLASMABLAD 3.0S (MISCELLANEOUS) ×1 IMPLANT
DRAPE C-ARM 42X72 X-RAY (DRAPES) ×6 IMPLANT
DRAPE HALF SHEET 40X57 (DRAPES) ×6 IMPLANT
DRAPE LAPAROTOMY 100X72X124 (DRAPES) ×3 IMPLANT
DRAPE POUCH INSTRU U-SHP 10X18 (DRAPES) ×3 IMPLANT
DRSG OPSITE POSTOP 4X10 (GAUZE/BANDAGES/DRESSINGS) ×3 IMPLANT
DURAPREP 26ML APPLICATOR (WOUND CARE) ×3 IMPLANT
DURASEAL APPLICATOR TIP (TIP) IMPLANT
DURASEAL SPINE SEALANT 3ML (MISCELLANEOUS) IMPLANT
ELECT REM PT RETURN 9FT ADLT (ELECTROSURGICAL) ×3
ELECTRODE REM PT RTRN 9FT ADLT (ELECTROSURGICAL) ×1 IMPLANT
GAUZE SPONGE 4X4 12PLY STRL (GAUZE/BANDAGES/DRESSINGS) IMPLANT
GAUZE SPONGE 4X4 16PLY XRAY LF (GAUZE/BANDAGES/DRESSINGS) IMPLANT
GLOVE BIO SURGEON STRL SZ 6.5 (GLOVE) ×8 IMPLANT
GLOVE BIO SURGEONS STRL SZ 6.5 (GLOVE) ×4
GLOVE BIOGEL PI IND STRL 6.5 (GLOVE) ×3 IMPLANT
GLOVE BIOGEL PI IND STRL 7.0 (GLOVE) ×1 IMPLANT
GLOVE BIOGEL PI IND STRL 7.5 (GLOVE) ×2 IMPLANT
GLOVE BIOGEL PI IND STRL 8.5 (GLOVE) ×2 IMPLANT
GLOVE BIOGEL PI INDICATOR 6.5 (GLOVE) ×6
GLOVE BIOGEL PI INDICATOR 7.0 (GLOVE) ×2
GLOVE BIOGEL PI INDICATOR 7.5 (GLOVE) ×4
GLOVE BIOGEL PI INDICATOR 8.5 (GLOVE) ×4
GLOVE ECLIPSE 8.5 STRL (GLOVE) ×6 IMPLANT
GLOVE SS BIOGEL STRL SZ 7 (GLOVE) ×1 IMPLANT
GLOVE SUPERSENSE BIOGEL SZ 7 (GLOVE) ×2
GLOVE SURG SS PI 7.0 STRL IVOR (GLOVE) ×3 IMPLANT
GOWN STRL REUS W/ TWL LRG LVL3 (GOWN DISPOSABLE) ×3 IMPLANT
GOWN STRL REUS W/ TWL XL LVL3 (GOWN DISPOSABLE) ×1 IMPLANT
GOWN STRL REUS W/TWL 2XL LVL3 (GOWN DISPOSABLE) ×6 IMPLANT
GOWN STRL REUS W/TWL LRG LVL3 (GOWN DISPOSABLE) ×6
GOWN STRL REUS W/TWL XL LVL3 (GOWN DISPOSABLE) ×2
GRAFT BN 10X1XDBM MAGNIFUSE (Bone Implant) ×1 IMPLANT
GRAFT BONE MAGNIFUSE 1X10CM (Bone Implant) ×2 IMPLANT
HEMOSTAT POWDER KIT SURGIFOAM (HEMOSTASIS) ×3 IMPLANT
KIT BASIN OR (CUSTOM PROCEDURE TRAY) ×3 IMPLANT
KIT ROOM TURNOVER OR (KITS) ×3 IMPLANT
MILL MEDIUM DISP (BLADE) ×3 IMPLANT
NEEDLE HYPO 22GX1.5 SAFETY (NEEDLE) ×3 IMPLANT
NEEDLE SPNL 18GX3.5 QUINCKE PK (NEEDLE) IMPLANT
NS IRRIG 1000ML POUR BTL (IV SOLUTION) ×3 IMPLANT
PACK LAMINECTOMY NEURO (CUSTOM PROCEDURE TRAY) ×3 IMPLANT
PAD ARMBOARD 7.5X6 YLW CONV (MISCELLANEOUS) ×15 IMPLANT
PATTIES SURGICAL .5 X1 (DISPOSABLE) ×3 IMPLANT
PLASMABLADE 3.0S (MISCELLANEOUS) ×3
ROD TI ALLOY CVD 5.5X105M VIT (Rod) ×6 IMPLANT
SCREW VITALITY PA 6.5X45MM (Screw) ×6 IMPLANT
SPACER ZYSTON STRT 11HX25X11 (Spacer) ×12 IMPLANT
SPONGE LAP 4X18 X RAY DECT (DISPOSABLE) ×6 IMPLANT
SPONGE SURGIFOAM ABS GEL 100 (HEMOSTASIS) ×3 IMPLANT
SUT PROLENE 6 0 BV (SUTURE) ×3 IMPLANT
SUT VIC AB 1 CT1 18XBRD ANBCTR (SUTURE) ×2 IMPLANT
SUT VIC AB 1 CT1 8-18 (SUTURE) ×4
SUT VIC AB 2-0 CP2 18 (SUTURE) ×6 IMPLANT
SUT VIC AB 3-0 SH 8-18 (SUTURE) ×9 IMPLANT
SYR 3ML LL SCALE MARK (SYRINGE) ×15 IMPLANT
SYR 5ML LL (SYRINGE) IMPLANT
TOP CLOSURE TORQ LIMIT (Neuro Prosthesis/Implant) ×24 IMPLANT
TOWEL OR 17X24 6PK STRL BLUE (TOWEL DISPOSABLE) ×3 IMPLANT
TOWEL OR 17X26 10 PK STRL BLUE (TOWEL DISPOSABLE) ×3 IMPLANT
TRAP SPECIMEN MUCOUS 40CC (MISCELLANEOUS) ×3 IMPLANT
TRAY FOLEY W/METER SILVER 16FR (SET/KITS/TRAYS/PACK) ×3 IMPLANT
WATER STERILE IRR 1000ML POUR (IV SOLUTION) ×3 IMPLANT

## 2016-07-03 NOTE — H&P (Signed)
Karen Reynolds is an 65 y.o. female.   Chief Complaint: Bilateral leg weakness low back pain. 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 After surgery however her condition is not stabilize and she has had progressive worsening and further weakness in her legs. She said extensive workup after. A total myelogram was repeated and this demonstrates that she has a fall substantial stenosis at level of L2-3 and 34 above her previous decompression. She is now being returned to the operating room to undergo surgical decompression arthrodesis from L2 to the previous fusion L4 to sacrum.  Past Medical History:  Diagnosis Date  . Arthritis    degenerative joint disease, lumbar region degeneration    . Bruises easily   . Cancer ([place]) 1989   cervical ca  . Cataract    immautre but not sure which eye  . Chronic back pain    stenosis  . Complication of anesthesia   . Diabetes mellitus without complication ([place])    takes Metformin daily  . Foot drop, bilateral   . GERD (gastroesophageal reflux disease)    takes Omeprazole daily  . Gout    takes Allopurinol daily  . High cholesterol    takes Atorvastatin daily  . Hypertension    takes Amlodipine,HCTZ, and Metoprolol daily  . Peripheral neuropathy ([place]) 03/09/2016  . Peripheral neuropathy ([place])   . PONV (postoperative nausea and vomiting)   . Spinal headache     Past Surgical History:  Procedure Laterality Date  . ABDOMINAL HYSTERECTOMY    . BACK SURGERY  1980   lumbar- L5-S1- laminectomy   . CESAREAN SECTION  1988  . CHOLECYSTECTOMY    . COLONOSCOPY    . COLONOSCOPY WITH ESOPHAGOGASTRODUODENOSCOPY (EGD)    . JOINT REPLACEMENT  3 &03/2015   bilateral knee  . left arm surgery     plates and screws  . STAPEDES SURGERY Bilateral    . TOTAL ELBOW REPLACEMENT Left     Family History  Problem Relation Age of Onset  . Heart disease Mother   . Stroke Father   . Dementia Sister    Social History:  reports that she has never smoked. She has never used smokeless tobacco. She reports that she does not drink alcohol or use drugs.  Allergies:  Allergies  Allergen Reactions  . Metolazone Other (See Comments)    Severe weight loss  . Prednisone Other (See Comments)    Runs sugar up    Medications Prior to Admission  Medication Sig Dispense Refill  . allopurinol (ZYLOPRIM) 300 MG tablet Take 300 mg by mouth daily.  3  . ALPHA LIPOIC ACID PO Take 1 tablet by mouth daily.     Marland Kitchen amLODipine (NORVASC) 10 MG tablet Take 10 mg by mouth daily.  3  . aspirin EC 81 MG tablet Take 81 mg by mouth daily.     Marland Kitchen atorvastatin (LIPITOR) 10 MG tablet Take 10 mg by mouth daily.  3  . hydrochlorothiazide (HYDRODIURIL) 25 MG tablet Take 25 mg by mouth daily.  3  . meloxicam (MOBIC) 15 MG tablet Take 15 mg by mouth daily before breakfast.   3  . metFORMIN (GLUCOPHAGE) 1000 MG tablet Take 1,000 mg by mouth 2 (two) times daily.  3  . metoprolol (LOPRESSOR)  100 MG tablet Take 50 mg by mouth 2 (two) times daily.  3  . Multiple Vitamin (MULTIVITAMIN) capsule Take 1 capsule by mouth daily.     . Omega-3 Fatty Acids (FISH OIL) 1000 MG CAPS Take 3,000 mg by mouth daily.    Marland Kitchen omeprazole (PRILOSEC) 40 MG capsule Take 40 mg by mouth daily.  3  . TURMERIC PO Take 1 tablet by mouth daily.    . Vitamin D, Cholecalciferol, 1000 units TABS Take 1,000 Units by mouth daily.     Marland Kitchen ibuprofen (ADVIL,MOTRIN) 200 MG tablet Take 800 mg by mouth daily as needed for moderate pain.     . Liniments (SALONPAS PAIN RELIEF PATCH) PADS Apply 1 patch topically daily as needed (for pain).     . valACYclovir (VALTREX) 1000 MG tablet Take 1,000 mg by mouth daily as needed (for fever blister flare ups).       Results for orders placed or performed during the hospital  encounter of 07/03/16 (from the past 48 hour(s))  Glucose, capillary     Status: Abnormal   Collection Time: 07/03/16  6:16 AM  Result Value Ref Range   Glucose-Capillary 180 (H) 65 - 99 mg/dL   No results found.  Review of Systems  HENT: Negative.   Eyes: Negative.   Respiratory: Negative.   Cardiovascular: Negative.   Gastrointestinal: Negative.   Genitourinary: Negative.   Musculoskeletal: Negative.   Skin: Negative.   Neurological: Positive for sensory change, focal weakness and weakness.       Weakness of tibialis anterior and extensor hallucis longus bilaterally  Endo/Heme/Allergies: Negative.   Psychiatric/Behavioral: Negative.     Blood pressure 133/68, pulse 73, temperature 98 F (36.7 C), temperature source Oral, resp. rate 20, height 5\' 5"  (1.651 m), weight 103.5 kg (228 lb 3 oz), SpO2 98 %. Physical Exam  Constitutional: She is oriented to person, place, and time. She appears well-developed and well-nourished.  Morbid obesity  HENT:  Head: Normocephalic and atraumatic.  Eyes: Conjunctivae and EOM are normal. Pupils are equal, round, and reactive to light.  Neck: Normal range of motion. Neck supple.  Cardiovascular: Normal rate and regular rhythm.   Respiratory: Effort normal and breath sounds normal.  GI: Soft. Bowel sounds are normal.  Musculoskeletal:  Weakness of tibialis anterior and extensor hallucis longus bilaterally. Mild atrophy in distal lower extremities. Proximal leg weakness in the iliopsoas and the quads 4-5. Absent patellar reflexes.  Neurological: She is alert and oriented to person, place, and time.  Skin: Skin is warm and dry.  Psychiatric: She has a normal mood and affect. Her behavior is normal. Judgment and thought content normal.     Assessment/Plan Extension of decompression and fusion at L2-3 and L3-4.  Earleen Newport, MD 07/03/2016, 7:31 AM

## 2016-07-03 NOTE — Progress Notes (Signed)
Pts chest xray done

## 2016-07-03 NOTE — Op Note (Signed)
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 History of decompression and fusion L4 to sacrum.  Postoperative diagnosis: Same  Procedure: Decompression of L2-3 and L3-4 via total laminectomy decompression of L2-L3 and L4 nerve roots with more work than required for simple posterior lumbar interbody arthrodesis. Posterior lumbar interbody arthrodesis with peek spacers local autograft L2-3 and L3-4. Segmental fixation L2-S1 with pedicle screws and posterior hardware. Posterior lateral arthrodesis with local autograft and allograft L2-L4.  Surgeon: Kristeen Miss First assistant: Cyndy Freeze M.D. Anesthesia: Gen. endotracheal Indications: The patient is a 65 year old individual who's had a progressively worsening foot drop with progressively worsening weakness in the legs and increasing pain across her back and into both legs she is found to have severe stenosis at L2-3 and L3-4. In January of this year she undergone a decompression at L4-5 and L5-S1 secondary to degenerative spondylolisthesis there she is having evidence of a rapidly progressive foot drop that was evolving first and right leg then in the left leg. After surgery she stabilized for a period time but has had progressive weakness more approximately and was found to have a severe stenosis with marked degenerative change in the discs at L2-3 and L3-4.  Procedure: The patient was brought to the operating room supine on a stretcher. After the smooth induction of general endotracheal anesthesia she underwent placement of a central line Foley catheter and then was turned to the prone position. The bony prominences were appropriately padded and protected and care was taken to protect her upper extremities which had several joint replacements. Then we proceeded to make an incision in the lower portion of the lumbar spine after prepping and draping with DuraPrep  and dressing sterilely. The dissection was carried down to the lumbar dorsal fascia and older part of the incision and the hardware was identified. The screw caps were loosened from the L4-L5 and the sacral screws bilaterally. The rod was lifted and removed and was immediately noted that there was substantial looseness of both L4 screws. The L4 screws were then removed. This allowed for better soft tissue dissection and the L3-L4 interval. The dissection was carried out to expose the laminar arch of L4 that remained. Then by starting at L2-3 the inferior margin lamina of L2 out to and including the entirety of the facet at L2-3 was removed. The entire laminar arch of L3 was removed and the remnant of L4 was then removed. The underlying dura was then carefully inspected and the dissection was carried out into the lateral gutters and the common dural tube and takeoffs of the L2 nerve root superiorly L3 nerve root inferior to this in the L4 nerve roots inferior to the next laminectomy was then identified and decompressed using a 2 and 3 mm Kerrison punch and a high-speed drill. Care was taken to protect the dura. The disc spaces at L2-3 were then explored and there was noted to be a substantial protrusion with subligamentous herniation of the disc at the disc space at the level of L2-L3. This contributed significantly to lifting the pads for the take off of the L3 nerve root. These areas were decompressed and a discectomy was performed at the L2-L3 level. The endplates were rongeured with a toothed curette and a series of respiratory. Interspace was then opened wider and was felt that 11 mm spacer would fit best into this interspace. A non-lordotic 11 mm interspace spacer was then filled with autograft and total  of 9 mL of autograft was placed into the disc space after the complete discectomy was complete completed. Attention was then turned to L3-L4 were similar process was carried out and here there is also noted be  substantial subligamentous disc herniation particularly out to the right side. This allowed for further decompression of the right L4 nerve root inferiorly. Once this was accomplished and the interspace was again sized and is felt that 11 mm spacer would fit into this interval once the entirety of the disc space was decorticated. Again 9 mL of bone graft was placed into this interspace along with 2 spacers.  Pedicle entry sites were then noted and found at the L2 and L3 vertebrae and 6.5 x 45 mm screws were placed into each of these vertebrae under direct visualization the L4 pedicle screws which were noted to be substantially loose did not offer good trajectory it was noted that the left-sided pedicle screw protruded through the endplate into the disc space at the L3-L4 level because of this degree of looseness it was decided not to place any screws in L4 but simply bridge the vertebrae with a fusion from L2 down to the sacrum. Rods were then contoured and placed between the screws at L2 L3 L5 and the sacrum. A transverse connector was applied. Lateral gutters were then packed with the remainder of autograft which total approximately 15 mL of bone graft and then to mixtures of bone in the bag was placed into the lateral gutters from L2-L4. Hemostasis was carefully checked retractor was removed and then after careful irrigation of the common dural tube and the lateral gutters the lumbar dorsal fascia was closed with #1 Vicryl interrupted fashion, 2-0 Vicryl was used in subcutaneous anus tissue, 3-0 Vicryl was used to close subcuticular skin. Dermabond was placed on the skin 3 and 50 mL blood loss was noted no Cell Saver blood was returned a shunt. Patient was returned to recovery room in stable condition.

## 2016-07-03 NOTE — Anesthesia Procedure Notes (Signed)
Procedure Name: Intubation Date/Time: 07/03/2016 7:43 AM Performed by: Ollen Bowl Pre-anesthesia Checklist: Patient identified, Emergency Drugs available, Suction available, Patient being monitored and Timeout performed Patient Re-evaluated:Patient Re-evaluated prior to inductionOxygen Delivery Method: Circle system utilized and Simple face mask Preoxygenation: Pre-oxygenation with 100% oxygen Intubation Type: IV induction Ventilation: Mask ventilation without difficulty Laryngoscope Size: Miller and 2 Grade View: Grade I Tube type: Oral Tube size: 7.5 mm Number of attempts: 1 Airway Equipment and Method: Patient positioned with wedge pillow and Stylet Placement Confirmation: ETT inserted through vocal cords under direct vision,  positive ETCO2 and breath sounds checked- equal and bilateral Secured at: 21 cm Tube secured with: Tape Dental Injury: Teeth and Oropharynx as per pre-operative assessment

## 2016-07-03 NOTE — Transfer of Care (Signed)
Immediate Anesthesia Transfer of Care Note  Patient: Karen Reynolds  Procedure(s) Performed: Procedure(s): POSTERIOR LUMBAR INTERBODY FUSION LUMBAR TWO-THREE,LUMBAR THREE-FOUR TIE INTO LUMBAR FOUR-SACRAL ONE FUSION (N/A)  Patient Location: PACU  Anesthesia Type:General  Level of Consciousness: awake, alert  and oriented  Airway & Oxygen Therapy: Patient Spontanous Breathing and Patient connected to nasal cannula oxygen  Post-op Assessment: Report given to RN, Post -op Vital signs reviewed and stable and Patient moving all extremities X 4  Post vital signs: Reviewed and stable  Last Vitals:  Vitals:   07/03/16 0626 07/03/16 1331  BP: 133/68 131/69  Pulse: 73 94  Resp: 20 (!) 41  Temp: 36.7 C 36.5 C    Last Pain:  Vitals:   07/03/16 0630  TempSrc:   PainSc: 6       Patients Stated Pain Goal: 4 ([id number])  Complications: No apparent anesthesia complications

## 2016-07-03 NOTE — Anesthesia Preprocedure Evaluation (Signed)
Anesthesia Evaluation  Patient identified by MRN, date of birth, ID band Patient awake    Reviewed: Allergy & Precautions, NPO status , Patient's Chart, lab work & pertinent test results  History of Anesthesia Complications (+) PONV  Airway Mallampati: I  TM Distance: >3 FB Neck ROM: Full    Dental   Pulmonary    Pulmonary exam normal        Cardiovascular hypertension, Normal cardiovascular exam     Neuro/Psych    GI/Hepatic GERD  Controlled and Medicated,  Endo/Other  diabetes  Renal/GU      Musculoskeletal   Abdominal   Peds  Hematology   Anesthesia Other Findings   Reproductive/Obstetrics                             Anesthesia Physical Anesthesia Plan  ASA: II  Anesthesia Plan: General   Post-op Pain Management:    Induction: Intravenous  Airway Management Planned: Oral ETT  Additional Equipment:   Intra-op Plan:   Post-operative Plan: Extubation in OR  Informed Consent: I have reviewed the patients History and Physical, chart, labs and discussed the procedure including the risks, benefits and alternatives for the proposed anesthesia with the patient or authorized representative who has indicated his/her understanding and acceptance.     Plan Discussed with: CRNA and Surgeon  Anesthesia Plan Comments:         Anesthesia Quick Evaluation

## 2016-07-03 NOTE — Progress Notes (Signed)
Patient ID: Karen Reynolds, female   DOB: [birth year], 65 y.o.   MRN: [id number] vss doing well post op stable

## 2016-07-04 DIAGNOSIS — M4806 Spinal stenosis, lumbar region: Principal | ICD-10-CM

## 2016-07-04 DIAGNOSIS — Z8739 Personal history of other diseases of the musculoskeletal system and connective tissue: Secondary | ICD-10-CM

## 2016-07-04 DIAGNOSIS — M5416 Radiculopathy, lumbar region: Secondary | ICD-10-CM

## 2016-07-04 LAB — CBC
HCT: 22.9 % — ABNORMAL LOW (ref 36.0–46.0)
Hemoglobin: 7.2 g/dL — ABNORMAL LOW (ref 12.0–15.0)
MCH: 27.2 pg (ref 26.0–34.0)
MCHC: 31.4 g/dL (ref 30.0–36.0)
MCV: 86.4 fL (ref 78.0–100.0)
Platelets: 221 10*3/uL (ref 150–400)
RBC: 2.65 MIL/uL — ABNORMAL LOW (ref 3.87–5.11)
RDW: 15.1 % (ref 11.5–15.5)
WBC: 11 10*3/uL — ABNORMAL HIGH (ref 4.0–10.5)

## 2016-07-04 MED ORDER — SODIUM CHLORIDE 0.9% FLUSH
10.0000 mL | INTRAVENOUS | Status: DC | PRN
  Administered 2016-07-04 – 2016-07-05 (×2): 10 mL
  Filled 2016-07-04 (×2): qty 40

## 2016-07-04 MED FILL — Sodium Chloride IV Soln 0.9%: INTRAVENOUS | Qty: 2000 | Status: AC

## 2016-07-04 MED FILL — Heparin Sodium (Porcine) Inj 1000 Unit/ML: INTRAMUSCULAR | Qty: 30 | Status: AC

## 2016-07-04 NOTE — Care Management Note (Signed)
Case Management Note  Patient Details  Name: Karen Reynolds MRN: [id number] Date of Birth: 08-08-1951  Subjective/Objective:     Pt underwent:POSTERIOR LUMBAR INTERBODY FUSION LUMBAR TWO-THREE,LUMBAR THREE-FOUR TIE INTO LUMBAR FOUR-SACRAL ONE FUSION. She is from home with her spouse.                Action/Plan: PT/OT recommendations are for CIR. CM following for d/c disposition.   Expected Discharge Date:                  Expected Discharge Plan:  [place]  In-House Referral:     Discharge planning Services     Post Acute Care Choice:    Choice offered to:     DME Arranged:    DME Agency:     HH Arranged:    [place] Agency:     Status of Service:  In process, will continue to follow  If discussed at Long Length of Stay Meetings, dates discussed:    Additional Comments:  Pollie Friar, RN 07/04/2016, 3:55 PM

## 2016-07-04 NOTE — Anesthesia Postprocedure Evaluation (Signed)
Anesthesia Post Note  Patient: Karen Reynolds  Procedure(s) Performed: Procedure(s) (LRB): POSTERIOR LUMBAR INTERBODY FUSION LUMBAR TWO-THREE,LUMBAR THREE-FOUR TIE INTO LUMBAR FOUR-SACRAL ONE FUSION (N/A)  Patient location during evaluation: PACU Anesthesia Type: General Level of consciousness: awake and alert Pain management: pain level controlled Vital Signs Assessment: post-procedure vital signs reviewed and stable Respiratory status: spontaneous breathing, nonlabored ventilation, respiratory function stable and patient connected to nasal cannula oxygen Cardiovascular status: blood pressure returned to baseline and stable Postop Assessment: no signs of nausea or vomiting Anesthetic complications: no    Last Vitals:  Vitals:   07/03/16 2114 07/04/16 0116  BP: (!) 109/53 (!) 94/42  Pulse: 71 73  Resp: 18 18  Temp: 36.5 C 36.5 C    Last Pain:  Vitals:   07/04/16 0116  TempSrc: Oral  PainSc:                  Mcihael Hinderman DAVID

## 2016-07-04 NOTE — Progress Notes (Signed)
RN assisted patient to chair, patient tolerated fairly, stated she felt weak, couldn't pick up feet, states history of foot drop since October 2016. Patient sat in chair for 20 minutes, then assisted back to bed. Will continue to monitor.

## 2016-07-04 NOTE — Evaluation (Signed)
Occupational Therapy Evaluation Patient Details Name: Karen Reynolds MRN: [id number] DOB: [birth year] Today's Date: 07/04/2016    History of Present Illness pt is a 64 y/o female with pmh of Cervical CA, DM, bil foot drop with AFO's, bil TKA's, peripheral neuropathy, admitted for decompression and fusion of full stenosis at L 23 and L34 with segmental pedicle screw fixation from L2 to S1.  Past Medical History:  Diagnosis Date  . Arthritis    degenerative joint disease, lumbar region degeneration    . Bruises easily   . Cancer ([place]) 1989   cervical ca  . Cataract    immautre but not sure which eye  . Chronic back pain    stenosis  . Complication of anesthesia   . Diabetes mellitus without complication ([place])    takes Metformin daily  . Foot drop, bilateral   . GERD (gastroesophageal reflux disease)    takes Omeprazole daily  . Gout    takes Allopurinol daily  . High cholesterol    takes Atorvastatin daily  . Hypertension    takes Amlodipine,HCTZ, and Metoprolol daily  . Peripheral neuropathy ([place]) 03/09/2016  . Peripheral neuropathy ([place])   . PONV (postoperative nausea and vomiting)   . Spinal headache       Clinical Impression   Patient is s/p L2-s1 fusion with pedicle screws surgery resulting in functional limitations due to the deficits listed below (see OT problem list). PTA was requiring spouse (A) for iadls and bil foot drop with prafo inserts. Patient will benefit from skilled OT acutely to increase independence and safety with ADLS to allow discharge CIR.     Follow Up Recommendations  CIR    Equipment Recommendations  3 in 1 bedside comode;Wheelchair cushion (measurements OT);Wheelchair (measurements OT)    Recommendations for Other Services Rehab consult     Precautions / Restrictions Precautions Precautions: Back Precaution Comments: handout provided and reviewed with patient Required Braces or Orthoses: Spinal Brace Spinal Brace: Lumbar  corset;Applied in sitting position      Mobility Bed Mobility               General bed mobility comments: in hall with PT in recliner on arrival  Transfers Overall transfer level: Needs assistance   Transfers: Sit to/from Stand Sit to Stand: Mod assist;+2 physical assistance Stand pivot transfers: Mod assist;+2 physical assistance       General transfer comment: 2 person for safety due to pt's knees buckle when fatigued.    Balance Overall balance assessment: Needs assistance Sitting-balance support: Bilateral upper extremity supported;Feet supported Sitting balance-Leahy Scale: Fair     Standing balance support: Bilateral upper extremity supported;During functional activity Standing balance-Leahy Scale: Poor Standing balance comment: requires bil UE and unable to sustain static standing                            ADL Overall ADL's : Needs assistance/impaired Eating/Feeding: Set up;Sitting   Grooming: Wash/dry face;Oral care;Set up;Sitting Grooming Details (indicate cue type and reason): attempted static standing and unable to sustain sink level. pt required sitting supported to complete task.  Upper Body Bathing: Minimal assitance   Lower Body Bathing: Total assistance   Upper Body Dressing : Minimal assistance   Lower Body Dressing: Maximal assistance   Toilet Transfer: +2 for physical assistance;Minimal assistance             General ADL Comments: pt currently unable to sustain static standing at  sink level for less than 1 minute. pt requires (A) to decline to chair and cues for hand placement.      Vision     Perception     Praxis      Pertinent Vitals/Pain Pain Assessment: 0-10 Pain Score: 7  Pain Location: back Pain Descriptors / Indicators: Operative site guarding Pain Intervention(s): Monitored during session;Premedicated before session;Repositioned     Hand Dominance Right   Extremity/Trunk Assessment Upper Extremity  Assessment Upper Extremity Assessment: Generalized weakness   Lower Extremity Assessment Lower Extremity Assessment: Generalized weakness   Cervical / Trunk Assessment Cervical / Trunk Assessment: Other exceptions (s/p surg)   Communication Communication Communication: No difficulties   Cognition Arousal/Alertness: Awake/alert Behavior During Therapy: WFL for tasks assessed/performed;Anxious Overall Cognitive Status: Within Functional Limits for tasks assessed                     General Comments       Exercises       Shoulder Instructions      Home Living Family/patient expects to be discharged to:: Private residence Living Arrangements: Spouse/significant other Available Help at Discharge: Family;Available 24 hours/day Type of Home: House Home Access: Stairs to enter [employer] of Steps: 1 Entrance Stairs-Rails: None Home Layout: Multi-level;1/2 bath on main level;Able to live on main level with bedroom/bathroom Alternate Level Stairs-Number of Steps: 2 stairs to get to kitchen - i rail. 14 to get to second floor bedroom bilateral rails. Flight to baselemt which she does not go to. Alternate Level Stairs-Rails: Right;Left Bathroom Shower/Tub: Occupational psychologist: Standard Bathroom Accessibility: Yes   Home Equipment: Toilet riser;Bedside commode;Shower seat;Cane - single point;Walker - 2 wheels;Walker - 4 wheels;Adaptive equipment Adaptive Equipment: Reacher;Long-handled shoe horn;Long-handled sponge Additional Comments: pt has shoes with prafo x2 pairs. pt requires (A) for white pair but PTA could don black pair independently      Prior Functioning/Environment Level of Independence: Needs assistance  Gait / Transfers Assistance Needed: W/C more than rollator in community.  RW in the house.` ADL's / Homemaking Assistance Needed: has been taking sponge baths lately        OT Diagnosis: Generalized weakness;Acute pain   OT  Problem List: Decreased strength;Decreased activity tolerance;Impaired balance (sitting and/or standing);Decreased safety awareness;Decreased knowledge of use of DME or AE;Decreased knowledge of precautions;Pain;Obesity   OT Treatment/Interventions: Self-care/ADL training;Therapeutic exercise;DME and/or AE instruction;Therapeutic activities;Patient/family education;Balance training    OT Goals(Current goals can be found in the care plan section) Acute Rehab OT Goals Patient Stated Goal: to be able to be independent OT Goal Formulation: With patient Time For Goal Achievement: 07/18/16 Potential to Achieve Goals: Good  OT Frequency: Min 2X/week   Barriers to D/C:            Co-evaluation              End of Session Equipment Utilized During Treatment: Gait belt;Back brace;Rolling walker Nurse Communication: Mobility status;Precautions  Activity Tolerance: Patient tolerated treatment well Patient left: in chair;with call bell/phone within reach;with family/visitor present;with chair alarm set   Time: [id number] OT Time Calculation (min): 13 min Charges:  OT General Charges $OT Visit: 1 Procedure OT Evaluation $OT Eval Moderate Complexity: 1 Procedure G-Codes:    Parke Poisson B 07-23-2016, 2:58 PM   Jeri Modena   OTR/L PagerIP:[phone] Office: [phone] .

## 2016-07-04 NOTE — Consult Note (Signed)
Physical Medicine and Rehabilitation Consult  Reason for Consult:  Lumbar radiculopathy with peripheral neuropathy/bilateral foot drop.   Referring Physician:  Dr. Ellene Route.    HPI: Karen Reynolds is a 65 y.o. 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 She elected to undergo L2/3 and L3/4 decompressive lam on 09/11/17by Dr. Ellene Route. Post op with ABLA and hypotension. PT/OT evaluations done today showing deficits in ability to complete ADL tasks as well as weakness with inability to stand. CIR recommended for follow up therapy.     Has been limited since Oct 2016 and was able to ambulate short distances with RW in home--used wheelchair out of home setting. Did some cooking using the rollater. She was was independent with  ADLs (sponge bathes due to inability to get to upstairs bathroom)  Patient states that she has bilateral AFOs for foot drop that came on in October 2016  Review of Systems  HENT: Negative for hearing loss.   Eyes: Negative for blurred vision and double vision.  Respiratory: Negative for cough and shortness of breath.   Cardiovascular: Negative for chest pain and palpitations.  Gastrointestinal: Negative for abdominal pain, heartburn and nausea.  Genitourinary: Negative for dysuria, frequency and urgency.  Musculoskeletal: Positive for back pain.  Skin: Negative for itching and rash.  Neurological: Positive for dizziness, sensory change and speech change. Negative for headaches.  Psychiatric/Behavioral: The patient is not nervous/anxious and does not have insomnia.       Past Medical History:  Diagnosis Date  . Arthritis    degenerative joint disease, lumbar region degeneration    . Bruises easily   . Cancer ([place]) 1989   cervical ca  . Cataract    immautre but not sure which eye  . Chronic back pain    stenosis  . Complication of anesthesia   . Diabetes mellitus without complication ([place])    takes Metformin daily  . Foot drop, bilateral   . GERD (gastroesophageal reflux disease)    takes Omeprazole daily  . Gout    takes Allopurinol daily  . High cholesterol    takes Atorvastatin daily  . Hypertension    takes Amlodipine,HCTZ, and Metoprolol daily  . Peripheral neuropathy ([place]) 03/09/2016  . Peripheral neuropathy ([place])   . PONV (postoperative nausea and vomiting)   . Spinal headache     Past Surgical History:  Procedure Laterality Date  . ABDOMINAL HYSTERECTOMY    . BACK SURGERY  1980   lumbar- L5-S1- laminectomy   . CESAREAN SECTION  1988  . CHOLECYSTECTOMY    . COLONOSCOPY    . COLONOSCOPY WITH ESOPHAGOGASTRODUODENOSCOPY (E[facility])    . JOINT REPLACEMENT  3 &03/2015   bilateral knee  . left arm surgery     plates and screws  . STAPEDES SURGERY Bilateral   . TOTAL ELBOW REPLACEMENT Left     Family History  Problem Relation Age of Onset  . Heart disease Mother   . Stroke Father   . Dementia Sister     Social History:  Married--husband 79 but in good health. 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 She reports that she has never smoked. She has never used smokeless tobacco. She reports that she does not drink alcohol or use drugs.     Allergies  Allergen Reactions  . Metolazone Other (See Comments)  Severe weight loss  . Prednisone Other (See Comments)    Runs sugar up    Medications Prior to Admission  Medication Sig Dispense Refill  . allopurinol (ZYLOPRIM) 300 MG tablet Take 300 mg by mouth daily.  3  . ALPHA LIPOIC ACID PO Take 1 tablet by mouth daily.     Marland Kitchen amLODipine (NORVASC) 10 MG tablet Take 10 mg by mouth daily.  3  . aspirin EC 81 MG tablet Take 81 mg by mouth daily.     Marland Kitchen atorvastatin (LIPITOR) 10 MG tablet Take 10 mg by mouth daily.  3  . hydrochlorothiazide (HYDRODIURIL) 25 MG tablet Take 25 mg by mouth daily.  3  .  meloxicam (MOBIC) 15 MG tablet Take 15 mg by mouth daily before breakfast.   3  . metFORMIN (GLUCOPHAGE) 1000 MG tablet Take 1,000 mg by mouth 2 (two) times daily.  3  . metoprolol (LOPRESSOR) 100 MG tablet Take 50 mg by mouth 2 (two) times daily.  3  . Multiple Vitamin (MULTIVITAMIN) capsule Take 1 capsule by mouth daily.     . Omega-3 Fatty Acids (FISH OIL) 1000 MG CAPS Take 3,000 mg by mouth daily.    Marland Kitchen omeprazole (PRILOSEC) 40 MG capsule Take 40 mg by mouth daily.  3  . TURMERIC PO Take 1 tablet by mouth daily.    . Vitamin D, Cholecalciferol, 1000 units TABS Take 1,000 Units by mouth daily.     Marland Kitchen ibuprofen (ADVIL,MOTRIN) 200 MG tablet Take 800 mg by mouth daily as needed for moderate pain.     . Liniments (SALONPAS PAIN RELIEF PATCH) PADS Apply 1 patch topically daily as needed (for pain).     . valACYclovir (VALTREX) 1000 MG tablet Take 1,000 mg by mouth daily as needed (for fever blister flare ups).       Home: Home Living Family/patient expects to be discharged to:: Private residence Living Arrangements: Spouse/significant other Available Help at Discharge: Family, Available 24 hours/day Type of Home: House Home Access: Stairs to enter [employer] of Steps: 1 Entrance Stairs-Rails: None Home Layout: Multi-level, 1/2 bath on main level, Able to live on main level with bedroom/bathroom Alternate Level Stairs-Number of Steps: 2 stairs to get to kitchen - i rail. 14 to get to second floor bedroom bilateral rails. Flight to baselemt which she does not go to. Alternate Level Stairs-Rails: Right, Left Bathroom Shower/Tub: Multimedia programmer: Standard Bathroom Accessibility: Yes Home Equipment: Toilet riser, Bedside commode, Shower seat, Cane - single point, Environmental consultant - 2 wheels, Walker - 4 wheels, Adaptive equipment Adaptive Equipment: Reacher, Long-handled shoe horn, Long-handled sponge Additional Comments: pt has shoes with prafo x2 pairs. pt requires (A) for  white pair but PTA could don black pair independently  Functional History: Prior Function Level of Independence: Needs assistance Gait / Transfers Assistance Needed: W/C more than rollator in community.  RW in the house.` ADL's / Homemaking Assistance Needed: has been taking sponge baths lately Functional Status:  Mobility: Bed Mobility Overal bed mobility: Needs Assistance Bed Mobility: Rolling, Sidelying to Sit Rolling: Mod assist (use of rail) Sidelying to sit: Min assist General bed mobility comments: in hall with PT in recliner on arrival Transfers Overall transfer level: Needs assistance Transfers: Sit to/from Stand Sit to Stand: Mod assist, +2 physical assistance Stand pivot transfers: Mod assist, +2 physical assistance General transfer comment: 2 person for safety due to pt's knees buckle when fatigued. Ambulation/Gait Ambulation/Gait assistance: Mod assist, +2 safety/equipment Ambulation Distance (Feet):  15 Feet (total over 3 trials of gait with a RW) Assistive device: Rolling walker (2 wheeled) Gait Pattern/deviations: Step-through pattern, Decreased stance time - right, Decreased step length - right, Decreased step length - left, Decreased stride length General Gait Details: weak-kneed gait with less stance time on the right.  Heavy use of RW.  Guarded at best. Gait velocity: slow Gait velocity interpretation: Below normal speed for age/gender    ADL: ADL Overall ADL's : Needs assistance/impaired Eating/Feeding: Set up, Sitting Grooming: Wash/dry face, Oral care, Set up, Sitting Grooming Details (indicate cue type and reason): attempted static standing and unable to sustain sink level. pt required sitting supported to complete task.  Upper Body Bathing: Minimal assitance Lower Body Bathing: Total assistance Upper Body Dressing : Minimal assistance Lower Body Dressing: Maximal assistance Toilet Transfer: +2 for physical assistance, Minimal assistance General ADL  Comments: pt currently unable to sustain static standing at sink level for less than 1 minute. pt requires (A) to decline to chair and cues for hand placement.   Cognition: Cognition Overall Cognitive Status: Within Functional Limits for tasks assessed Orientation Level: Oriented X4 Cognition Arousal/Alertness: Awake/alert Behavior During Therapy: WFL for tasks assessed/performed, Anxious Overall Cognitive Status: Within Functional Limits for tasks assessed   Blood pressure (!) 112/53, pulse 75, temperature 98.1 F (36.7 C), temperature source Oral, resp. rate 19, height 5\' 5"  (1.651 m), weight 103.5 kg (228 lb 3 oz), SpO2 95 %. Physical Exam  Nursing note and vitals reviewed. Constitutional: She is oriented to person, place, and time. She appears well-developed and well-nourished.  HENT:  Head: Normocephalic and atraumatic.  Mouth/Throat: Oropharynx is clear and moist.  Eyes: Conjunctivae are normal. Pupils are equal, round, and reactive to light.  Neck: Normal range of motion. Neck supple.  Cardiovascular: Normal rate and regular rhythm.   Respiratory: Effort normal and breath sounds normal. No stridor. No respiratory distress. She has no wheezes.  GI: Soft. Bowel sounds are normal. She exhibits no distension. There is no tenderness.  Musculoskeletal: She exhibits edema.  Well healed old incisions left elbow and bilateral knees.   Neurological: She is alert and oriented to person, place, and time.  Dysphonic speech. Able to follow basic commands without difficulty.   Skin: Skin is warm and dry. She is not diaphoretic.  Motor strength is 5/5 bilateral deltoid, bicep, tricep, grip, 3 minus bilateral hip flexors 4 minus bilateral knee extensors trace bilateral ankle dorsiflexors, 2 minus bilateral ankle plantar flexors  Results for orders placed or performed during the hospital encounter of 07/03/16 (from the past 24 hour(s))  CBC     Status: Abnormal   Collection Time: 07/04/16   1:35 PM  Result Value Ref Range   WBC 11.0 (H) 4.0 - 10.5 K/uL   RBC 2.65 (L) 3.87 - 5.11 MIL/uL   Hemoglobin 7.2 (L) 12.0 - 15.0 g/dL   HCT 22.9 (L) 36.0 - 46.0 %   MCV 86.4 78.0 - 100.0 fL   MCH 27.2 26.0 - 34.0 pg   MCHC 31.4 30.0 - 36.0 g/dL   RDW 15.1 11.5 - 15.5 %   Platelets 221 150 - 400 K/uL   Dg Lumbar Spine 2-3 Views  Result Date: 07/03/2016 CLINICAL DATA:  Intraoperative images, posterior lumbar interbody fusion EXAM: LUMBAR SPINE - 2-3 VIEW COMPARISON:  07/03/2014 FINDINGS: Total fluoroscopy time was 33 seconds. Two low resolution intraoperative spot films of the lumbar spine are submitted for interpretation. The images demonstrate posterior stabilization rods and fixating screws  extending from apparent L2 level through S1. Interbody disc devices are present from L2 through S1. IMPRESSION: Intraoperative fluoroscopy for posterior lumbar interbody fusion Electronically Signed   By: Donavan Foil M.D.   On: 07/03/2016 13:06   Dg Chest Port 1 View  Result Date: 07/03/2016 CLINICAL DATA:  Central line placement. EXAM: PORTABLE CHEST 1 VIEW COMPARISON:  08/09/2010 FINDINGS: Right internal jugular central line has its tip in the SVC 2 cm above the right atrium. No pneumothorax. There is atelectasis in both lower lungs. Cardiac silhouette remains enlarged. IMPRESSION: Central line well positioned with the tip in the SVC 2 cm above the right atrium. Areas of atelectasis in both lower lungs. Electronically Signed   By: Nelson Chimes M.D.   On: 07/03/2016 14:03    Assessment/Plan: Diagnosis: Bilateral lumbar L3  radiculopathy superimposed on bilateral foot drop, status post L-2-L3, L3-L4 laminectomies, postoperative day #2 1. Does the need for close, 24 hr/day medical supervision in concert with the patient's rehab needs make it unreasonable for this patient to be served in a less intensive setting? Yes 2. Co-Morbidities requiring supervision/potential complications: Type 2 diabetes, gout,  chronic deconditioning 3. Due to bladder management, bowel management, safety, skin/wound care, disease management, medication administration, pain management and patient education, does the patient require 24 hr/day rehab nursing? Yes 4. Does the patient require coordinated care of a physician, rehab nurse, PT (1-2 hrs/day, 5 days/week) and OT (1-2 hrs/day, 5 days/week) to address physical and functional deficits in the context of the above medical diagnosis(es)? Yes Addressing deficits in the following areas: balance, endurance, locomotion, strength, transferring, bowel/bladder control, bathing, dressing, feeding, grooming, toileting, cognition and psychosocial support 5. Can the patient actively participate in an intensive therapy program of at least 3 hrs of therapy per day at least 5 days per week? Yes 6. The potential for patient to make measurable gains while on inpatient rehab is good 7. Anticipated functional outcomes upon discharge from inpatient rehab are modified independent and supervision  with PT, modified independent and supervision with OT, n/a with SLP. 8. Estimated rehab length of stay to reach the above functional goals is: 7-10d  9. Does the patient have adequate social supports and living environment to accommodate these discharge functional goals? Yes 10. Anticipated D/C setting: Home 11. Anticipated post D/C treatments: [place] therapy 12. Overall Rehab/Functional Prognosis: good  RECOMMENDATIONS: This patient's condition is appropriate for continued rehabilitative care in the following setting: CIR Patient has agreed to participate in recommended program. Yes Note that insurance prior authorization may be required for reimbursement for recommended care.  Comment: Patient states she went to skilled nursing after last surgery    07/04/2016

## 2016-07-04 NOTE — Evaluation (Signed)
Physical Therapy Evaluation Patient Details Name: Karen Reynolds MRN: [id number] DOB: [birth year] Today's Date: 07/04/2016   History of Present Illness  pt is a 65 y/o female with pmh of Cervical CA, DM, bil foot drop with AFO's, bil TKA's, peripheral neuropathy, admitted for decompression and fusion of full stenosis at L 23 and L34 with segmental pedicle screw fixation from L2 to S1.  Clinical Impression  Pt admitted with/for lumbar fusion surgery.  Pt currently limited functionally due to the problems listed. ( See problems list.)   Pt will benefit from PT to maximize function and safety in order to get ready for next venue listed below.  Pt currently at a mod A of 2 persons for basic mobility.     Follow Up Recommendations CIR    Equipment Recommendations  None recommended by PT    Recommendations for Other Services Rehab consult     Precautions / Restrictions Precautions Precautions: Back Precaution Booklet Issued: No Required Braces or Orthoses: Spinal Brace Spinal Brace: Lumbar corset;Applied in sitting position      Mobility  Bed Mobility Overal bed mobility: Needs Assistance Bed Mobility: Rolling;Sidelying to Sit Rolling: Mod assist (use of rail) Sidelying to sit: Min assist       General bed mobility comments: discussed safest technique, assisted with log roll, pt needing rail.  Transfers Overall transfer level: Needs assistance   Transfers: Sit to/from Stand;Stand Pivot Transfers Sit to Stand: Mod assist;+2 physical assistance Stand pivot transfers: Mod assist;+2 physical assistance       General transfer comment: 2 person for safety due to pt's knees buckle when fatigued.  Ambulation/Gait Ambulation/Gait assistance: Mod assist;+2 safety/equipment Ambulation Distance (Feet): 15 Feet (total over 3 trials of gait with a RW) Assistive device: Rolling walker (2 wheeled) Gait Pattern/deviations: Step-through pattern;Decreased stance time - right;Decreased step  length - right;Decreased step length - left;Decreased stride length Gait velocity: slow Gait velocity interpretation: Below normal speed for age/gender General Gait Details: weak-kneed gait with less stance time on the right.  Heavy use of RW.  Guarded at best.  Financial trader Rankin (Stroke Patients Only)       Balance Overall balance assessment: Needs assistance   Sitting balance-Leahy Scale: Good     Standing balance support: Bilateral upper extremity supported Standing balance-Leahy Scale: Poor Standing balance comment: heavy reliance on the RW                             Pertinent Vitals/Pain Pain Assessment: 0-10 Pain Score: 7  Pain Location: back Pain Descriptors / Indicators: Aching;Grimacing;Sore Pain Intervention(s): Monitored during session;Premedicated before session;Repositioned    Home Living Family/patient expects to be discharged to:: Private residence Living Arrangements: Spouse/significant other Available Help at Discharge: Family;Available 24 hours/day Type of Home: House Home Access: Stairs to enter Entrance Stairs-Rails: None Entrance Stairs-Number of Steps: 1 Home Layout: Multi-level;1/2 bath on main level;Able to live on main level with bedroom/bathroom Home Equipment: Toilet riser;Bedside commode;Shower seat;Cane - single point;Walker - 2 wheels;Walker - 4 wheels;Adaptive equipment      Prior Function Level of Independence: Needs assistance   Gait / Transfers Assistance Needed: W/C more than rollator in community.  RW in the house.`  ADL's / Homemaking Assistance Needed: has been taking sponge baths lately        Hand Dominance   Dominant Hand: Right  Extremity/Trunk Assessment   Upper Extremity Assessment: Defer to OT evaluation           Lower Extremity Assessment: Generalized weakness (foot drop bilaterally, grossly 3+/5 to 4-/5 bil strength)         Communication    Communication: No difficulties  Cognition Arousal/Alertness: Awake/alert Behavior During Therapy: WFL for tasks assessed/performed;Anxious Overall Cognitive Status: Within Functional Limits for tasks assessed                      General Comments General comments (skin integrity, edema, etc.): pt instructed in back care/prec, log roll, transitions to sit, lifting restrictions, bracing issues and progression of activity.    Exercises        Assessment/Plan    PT Assessment Patient needs continued PT services  PT Diagnosis Generalized weakness;Acute pain;Difficulty walking   PT Problem List Decreased strength;Decreased activity tolerance;Decreased mobility;Decreased knowledge of use of DME;Decreased knowledge of precautions;Pain  PT Treatment Interventions DME instruction;Gait training;Functional mobility training;Stair training;Therapeutic activities;Patient/family education   PT Goals (Current goals can be found in the Care Plan section) Acute Rehab PT Goals Patient Stated Goal: Be able to do more independently PT Goal Formulation: With patient Time For Goal Achievement: 07/18/16 Potential to Achieve Goals: Good    Frequency Min 5X/week   Barriers to discharge        Co-evaluation               End of Session Equipment Utilized During Treatment: Back brace Activity Tolerance: Patient tolerated treatment well;Other (comment) (limited by strength and endurance) Patient left: in chair;Other (comment) (with OT) Nurse Communication: Mobility status         Time: [id number] PT Time Calculation (min) (ACUTE ONLY): 51 min   Charges:   PT Evaluation $PT Eval Moderate Complexity: 1 Procedure PT Treatments $Gait Training: 8-22 mins $Self Care/Home Management: 8-22   PT G Codes:        Darcella Shiffman, Tessie Fass 07/04/2016, 11:13 AM 07/04/2016  Donnella Sham, PT [phone]  (pager)

## 2016-07-04 NOTE — Progress Notes (Signed)
Notified on call MD of new hemoglobin status.  No new orders given at this time.

## 2016-07-04 NOTE — Progress Notes (Addendum)
Rehab Admissions Coordinator Note:  Patient was screened by Cleatrice Burke for appropriateness for an Inpatient Acute Rehab Consult per PT recommendation. Noted pt previously 11/2015 went to SNF after previous surgery. Pt has Health team [employer]. If pt would like to pursue an inpt rehab admission, please place Inpt rehab consult order and we will assess for potential admission.Cleatrice Burke 07/04/2016, 11:22 AM  I can be reached at [phone].  Discussed with OT. Pt unsure about rehab due to costs she will incur with inpt vs SNF. We can check her benefits and discuss with her options if pt felt to be an appropriate candidate.

## 2016-07-04 NOTE — Progress Notes (Signed)
Patient ID: Karen Reynolds, female   DOB: November 01, 1950, 65 y.o.   MRN: [id number] Vital signs are stable though blood pressure is a little low at 94/68. Heart rate is normal. Blood pressure meds being held today Patient has not been out of bed yet however motor strength in bed appears to be good save for tibialis anterior weakness.  We'll mobilize her today check CBC. KVO IV fluids.

## 2016-07-05 NOTE — Progress Notes (Signed)
Physical Therapy Treatment Patient Details Name: Karen Reynolds MRN: [id number] DOB: 04-12-1951 Today's Date: 07/05/2016    History of Present Illness pt is a 65 y/o female with pmh of Cervical CA, DM, bil foot drop with AFO's, bil TKA's, peripheral neuropathy, admitted for decompression and fusion of full stenosis at L 23 and L34 with segmental pedicle screw fixation from L2 to S1.    PT Comments    Patient limited by pain and required +2 assist for OOB transfers. Continue to progress as tolerated.   Follow Up Recommendations  CIR     Equipment Recommendations  Other (comment) (TBD next venue)    Recommendations for Other Services Rehab consult     Precautions / Restrictions Precautions Precautions: Back Precaution Booklet Issued: No Precaution Comments: pt able to recall 3/3 precautions Required Braces or Orthoses: Spinal Brace Spinal Brace: Lumbar corset;Applied in sitting position Restrictions Weight Bearing Restrictions: No    Mobility  Bed Mobility Overal bed mobility: Needs Assistance Bed Mobility: Rolling;Sidelying to Sit;Sit to Sidelying Rolling: Min guard (use of rail) Sidelying to sit: Min assist     Sit to sidelying: Mod assist General bed mobility comments: carry over of technique; use of rails; assist to elevate trunk into sitting and elevate bilat LE into bed  Transfers Overall transfer level: Needs assistance Equipment used: Rolling walker (2 wheeled) Transfers: Sit to/from [employer] Sit to Stand: Mod assist;+2 physical assistance Stand pivot transfers: Mod assist;+2 physical assistance       General transfer comment: from EOB and BSC; +2 for safety as pt is impulsive and begins to sit without much warning; cues for safe hand placement, posture, and use of AD; pt maintained flexed trunk in standing despite mulimodal cues; required guidance of hips for safe descent to surface  Ambulation/Gait             General Gait  Details: pt declined   Stairs            Wheelchair Mobility    Modified Rankin (Stroke Patients Only)       Balance     Sitting balance-Leahy Scale: Fair       Standing balance-Leahy Scale: Poor                      Cognition Arousal/Alertness: Awake/alert Behavior During Therapy: WFL for tasks assessed/performed;Anxious Overall Cognitive Status: Within Functional Limits for tasks assessed                      Exercises      General Comments General comments (skin integrity, edema, etc.): total A to donn AFOs and shoes      Pertinent Vitals/Pain Pain Assessment: Faces Faces Pain Scale: Hurts even more Pain Location: back and bilat hips Pain Descriptors / Indicators: Grimacing;Guarding;Moaning;Sore Pain Intervention(s): Limited activity within patient's tolerance;Monitored during session;Premedicated before session;Repositioned;Patient requesting pain meds-RN notified    Home Living                      Prior Function            PT Goals (current goals can now be found in the care plan section) Acute Rehab PT Goals Patient Stated Goal: decreased pain PT Goal Formulation: With patient Time For Goal Achievement: 07/18/16 Potential to Achieve Goals: Good Progress towards PT goals: Not progressing toward goals - comment (limited by pain)    Frequency  Min 5X/week  PT Plan Current plan remains appropriate    Co-evaluation             End of Session Equipment Utilized During Treatment: Back brace Activity Tolerance: Patient limited by pain Patient left: in bed;with call bell/phone within reach;with bed alarm set     Time: 1207-1225 PT Time Calculation (min) (ACUTE ONLY): 18 min  Charges:  $Therapeutic Activity: 8-22 mins                    G Codes:      Salina April, PTA Pager: [phone]   07/05/2016, 1:58 PM

## 2016-07-05 NOTE — Consult Note (Signed)
            [facility] CM Primary Care Navigator  07/05/2016  Karen Reynolds Dec 26, 1950 [id number]  Patient seen at the bedside to identify possible discharge needs.  Patient states being here for a scheduled surgery done by Dr. Ellene Route. She endorses Dr. Janace Litten ([place]) as her primary care provider.     Patient shared using [facility] in [place] for pharmacy to obtain her medications. She states calling medications in and husband picks them up. She verbalized managing her own medications with no difficulties.    Patient states that husband provides transportation to her doctors' appointments and he is the primary caregiver at home.  According to patient, she will be transferred to [place] for more extensive therapy but not sure when. Patient states she would anticipate home health to work with her in home.  Patient voiced understanding to call primary care provider's office for a follow-up appointment within a week or sooner if needed, once discharged from the hospital.   Patient letter given to her.        She denies further needs or concerns at this time. For additional questions please contact:  Edwena Felty A. Ithzel Fedorchak, BSN, RN-BC [facility] PRIMARY CARE Navigator Cell: [phone]

## 2016-07-05 NOTE — Progress Notes (Signed)
Rehab admissions - I am currently sending clinicals to [facility] requesting acute inpatient rehab admission.  I will follow up once I hear back from insurance case manager.  Call me for questions.  [id number]

## 2016-07-05 NOTE — Progress Notes (Addendum)
Patient ID: Karen Reynolds, female   DOB: 08-May-1951, 65 y.o.   MRN: [id number] Vital signs are stable There is intact Centralized back pain Mobilizing well Continue to work with PT

## 2016-07-05 NOTE — Progress Notes (Signed)
Patient had to be strongly encouraged to get out of bed to use the bathroom. She refused to walk to the bathroom but would use the [facility]. She needed three assist, brace and walker with her boots to transfer and even then we had to have the chair directly behind her to prevent a fall. Pt kept stating that she "couldn't do it" and needed lots of encouragement to stand and pivot. She is sitting up in the chair at this time with her brace applied, pain meds given, and monitoring closely. Kalene Cutler, Rande Brunt, RN

## 2016-07-06 MED ORDER — CIPROFLOXACIN HCL 500 MG PO TABS
500.0000 mg | ORAL_TABLET | Freq: Two times a day (BID) | ORAL | Status: DC
  Administered 2016-07-06 – 2016-07-07 (×3): 500 mg via ORAL
  Filled 2016-07-06 (×3): qty 1

## 2016-07-06 MED ORDER — MANAGING BACK PAIN BOOK
Freq: Once | Status: AC
  Administered 2016-07-06: 08:00:00
  Filled 2016-07-06: qty 1

## 2016-07-06 NOTE — NC FL2 (Signed)
[place] MEDICAID FL2 LEVEL OF CARE SCREENING TOOL     IDENTIFICATION  Patient Name: Karen Reynolds Birthdate: Nov 27, 1950 Sex: female Admission Date (Current Location): 07/03/2016  [facility] and [place] Number:  Herbalist and Address:  [facility], [place] [address], [place], [place] [address]      Provider Number: [id number]  Attending Physician Name and Address:  Kristeen Miss, MD  Relative Name and Phone Number:       Current Level of Care: Hospital Recommended Level of Care: [place] Prior Approval Number:    Date Approved/Denied:   PASRR Number: [id number]  Discharge Plan: SNF    Current Diagnoses: Patient Active Problem List   Diagnosis Date Noted  . Lumbar stenosis 07/03/2016  . Peripheral neuropathy ([place]) 03/09/2016  . Spondylolisthesis of lumbar region 11/23/2015    Orientation RESPIRATION BLADDER Height & Weight     Self, Time, Situation, Place  Normal Continent Weight: 228 lb 3 oz (103.5 kg) Height:  5\' 5"  (165.1 cm)  BEHAVIORAL SYMPTOMS/MOOD NEUROLOGICAL BOWEL NUTRITION STATUS      Continent Diet (Carb Modified, Thin Liquids)  AMBULATORY STATUS COMMUNICATION OF NEEDS Skin   Extensive Assist Verbally Surgical wounds                       Personal Care Assistance Level of Assistance  Bathing, Feeding, Dressing Bathing Assistance: Limited assistance Feeding assistance: Independent Dressing Assistance: Limited assistance     Functional Limitations Info  Sight, Hearing, Speech Sight Info: Adequate Hearing Info: Adequate Speech Info: Adequate    SPECIAL CARE FACTORS FREQUENCY  PT (By licensed PT), OT (By licensed OT)     PT Frequency: 5 OT Frequency: 5            Contractures Contractures Info: Not present    Additional Factors Info  Code Status, Allergies Code Status Info: Full Code Allergies Info: Metolazone, Prednisone           Current Medications (07/06/2016):  This is the  current hospital active medication list Current Facility-Administered Medications  Medication Dose Route Frequency Provider Last Rate Last Dose  . 0.9 %  sodium chloride infusion  250 mL Intravenous Continuous Kristeen Miss, MD      . acetaminophen (TYLENOL) tablet 650 mg  650 mg Oral Q4H PRN Kristeen Miss, MD       Or  . acetaminophen (TYLENOL) suppository 650 mg  650 mg Rectal Q4H PRN Kristeen Miss, MD      . allopurinol (ZYLOPRIM) tablet 300 mg  300 mg Oral Daily Kristeen Miss, MD   300 mg at 07/06/16 1018  . alum & mag hydroxide-simeth (MAALOX/MYLANTA) 200-200-20 MG/5ML suspension 30 mL  30 mL Oral Q6H PRN Kristeen Miss, MD      . amLODipine (NORVASC) tablet 10 mg  10 mg Oral Daily Kristeen Miss, MD   10 mg at 07/05/16 1008  . atorvastatin (LIPITOR) tablet 10 mg  10 mg Oral Daily Kristeen Miss, MD   10 mg at 07/06/16 1018  . bisacodyl (DULCOLAX) suppository 10 mg  10 mg Rectal Daily PRN Kristeen Miss, MD      . ciprofloxacin (CIPRO) tablet 500 mg  500 mg Oral BID Kristeen Miss, MD   500 mg at 07/06/16 1017  . docusate sodium (COLACE) capsule 100 mg  100 mg Oral BID Kristeen Miss, MD   100 mg at 07/06/16 1018  . hydrochlorothiazide (HYDRODIURIL) tablet 25 mg  25 mg  Oral Daily Kristeen Miss, MD   25 mg at 07/05/16 1009  . HYDROmorphone (DILAUDID) injection 0.5-1 mg  0.5-1 mg Intravenous Q2H PRN Kristeen Miss, MD   1 mg at 07/05/16 0555  . menthol-cetylpyridinium (CEPACOL) lozenge 3 mg  1 lozenge Oral PRN Kristeen Miss, MD   3 mg at 07/05/16 0556   Or  . phenol (CHLORASEPTIC) mouth spray 1 spray  1 spray Mouth/Throat PRN Kristeen Miss, MD      . metFORMIN (GLUCOPHAGE) tablet 1,000 mg  1,000 mg Oral BID WC Kristeen Miss, MD   1,000 mg at 07/06/16 0746  . methocarbamol (ROBAXIN) tablet 500 mg  500 mg Oral Q6H PRN Kristeen Miss, MD   500 mg at 07/06/16 [id number]   Or  . methocarbamol (ROBAXIN) 500 mg in dextrose 5 % 50 mL IVPB  500 mg Intravenous Q6H PRN Kristeen Miss, MD      . metoprolol (LOPRESSOR) tablet 50 mg   50 mg Oral BID Kristeen Miss, MD   50 mg at 07/05/16 2058  . ondansetron (ZOFRAN) injection 4 mg  4 mg Intravenous Q4H PRN Kristeen Miss, MD      . oxyCODONE-acetaminophen (PERCOCET/ROXICET) 5-325 MG per tablet 1-2 tablet  1-2 tablet Oral Q4H PRN Kristeen Miss, MD   2 tablet at 07/06/16 [phone]  . pantoprazole (PROTONIX) EC tablet 40 mg  40 mg Oral Daily Kristeen Miss, MD   40 mg at 07/06/16 1018  . polyethylene glycol (MIRALAX / GLYCOLAX) packet 17 g  17 g Oral Daily PRN Kristeen Miss, MD      . senna (SENOKOT) tablet 8.6 mg  1 tablet Oral BID Kristeen Miss, MD   8.6 mg at 07/06/16 1017  . sodium chloride flush (NS) 0.9 % injection 10-40 mL  10-40 mL Intracatheter PRN Kristeen Miss, MD   10 mL at 07/05/16 0535  . sodium chloride flush (NS) 0.9 % injection 3 mL  3 mL Intravenous Q12H Kristeen Miss, MD   3 mL at 07/05/16 2200  . sodium chloride flush (NS) 0.9 % injection 3 mL  3 mL Intravenous PRN Kristeen Miss, MD      . sodium phosphate (FLEET) 7-19 GM/118ML enema 1 enema  1 enema Rectal Once PRN Kristeen Miss, MD      . valACYclovir (VALTREX) tablet 1,000 mg  1,000 mg Oral Daily PRN Kristeen Miss, MD         Discharge Medications: Please see discharge summary for a list of discharge medications.  Relevant Imaging Results:  Relevant Lab Results:   Additional Information SSN:  [id number]  Darden Dates, LCSW

## 2016-07-06 NOTE — Care Management Important Message (Signed)
Important Message  Patient Details  Name: Karen Reynolds MRN: [id number] Date of Birth: January 03, 1951   Medicare Important Message Given:  Yes    Aliegha Paullin Abena 07/06/2016, 11:07 AM

## 2016-07-06 NOTE — Progress Notes (Signed)
Physical Therapy Treatment Patient Details Name: Karen Reynolds MRN: [id number] DOB: [birth year] Today's Date: 07/06/2016    History of Present Illness pt is a 65 y/o female with pmh of Cervical CA, DM, bil foot drop with AFO's, bil TKA's, peripheral neuropathy, admitted for decompression and fusion of full stenosis at L 23 and L34 with segmental pedicle screw fixation from L2 to S1.    PT Comments    Patient is progressing gradually toward mobility goals. Limited by pain but tolerated gait this session. Pt continues to c/o pain in bilat hips with mobility that began yesterday. Continue to progress as tolerated.   Follow Up Recommendations  CIR     Equipment Recommendations  Other (comment) (TBD next venue)    Recommendations for Other Services Rehab consult     Precautions / Restrictions Precautions Precautions: Back Precaution Booklet Issued: No Precaution Comments: pt able to recall 3/3 precautions Required Braces or Orthoses: Spinal Brace Spinal Brace: Lumbar corset;Applied in sitting position Restrictions Weight Bearing Restrictions: No    Mobility  Bed Mobility Overal bed mobility: Needs Assistance Bed Mobility: Rolling;Sidelying to Sit Rolling: Supervision (use of rail) Sidelying to sit: Min guard       General bed mobility comments: cues for maintaining precuations  Transfers Overall transfer level: Needs assistance Equipment used: Rolling walker (2 wheeled) Transfers: Sit to/from Stand Sit to Stand: Min assist;Mod assist;+2 physical assistance         General transfer comment: min A +2 from EOB and mod +2 from recliner; cues for hand placement and technique; assist to power up into standing with pt maintaining flexed bilat knees and trunk and required facilitation at hips to achieve upright posture and assist for safe descent as pt will not reach back for surface despite max cues  Ambulation/Gait Ambulation/Gait assistance: Mod assist;+2  safety/equipment Ambulation Distance (Feet): 32 Feet (with seated rest break) Assistive device: Rolling walker (2 wheeled) Gait Pattern/deviations: Step-through pattern;Decreased step length - right;Decreased step length - left;Trunk flexed Gait velocity: decreased   General Gait Details: multimodal cues for upright posture and increased bilat step length; heavy reliance on RW   Stairs            Wheelchair Mobility    Modified Rankin (Stroke Patients Only)       Balance     Sitting balance-Leahy Scale: Fair       Standing balance-Leahy Scale: Poor                      Cognition Arousal/Alertness: Awake/alert Behavior During Therapy: WFL for tasks assessed/performed;Anxious Overall Cognitive Status: Within Functional Limits for tasks assessed                      Exercises      General Comments        Pertinent Vitals/Pain Pain Assessment: Faces Faces Pain Scale: Hurts whole lot Pain Location: back and bilat hips Pain Descriptors / Indicators: Aching;Grimacing;Guarding;Sore Pain Intervention(s): Limited activity within patient's tolerance;Monitored during session;Premedicated before session;Repositioned    Home Living                      Prior Function            PT Goals (current goals can now be found in the care plan section) Acute Rehab PT Goals Patient Stated Goal: decreased pain PT Goal Formulation: With patient Time For Goal Achievement: 07/18/16 Potential to Achieve Goals: Good Progress  towards PT goals: Progressing toward goals    Frequency  Min 5X/week    PT Plan Current plan remains appropriate    Co-evaluation             End of Session Equipment Utilized During Treatment: Back brace Activity Tolerance: Patient limited by pain Patient left: with call bell/phone within reach;in chair;with family/visitor present     Time: [id number] PT Time Calculation (min) (ACUTE ONLY): 24 min  Charges:   $Gait Training: 8-22 mins $Therapeutic Activity: 8-22 mins                    G Codes:      Salina April, PTA Pager: [phone]   07/06/2016, 3:34 PM

## 2016-07-06 NOTE — PMR Pre-admission (Signed)
PMR Admission Coordinator Pre-Admission Assessment  Patient: Karen Reynolds is an 65 y.o., female MRN: [id number] DOB: Oct 22, 1951 Height: '5\' 5"'$  (165.1 cm) Weight: 103.5 kg (228 lb 3 oz)              Insurance Information HMO:     PPO: Yes     PCP:       IPA:       80/20:       OTHER: Group # [id number]  PRIMARY: [employer]      Policy#: [id number]      Subscriber: Bonner Puna CM Name: Maretta Bees    Phone#: [phone]     Fax#: [phone] Pre-Cert#: [id number] approved for 7 days     Employer: Retired Benefits:  Phone #: [phone]     Name: Candelaria Stagers. Date: 10/24/15     Deduct:  $0      Out of Pocket Max: $3400 (met [phone])      Life Max: unlimited CIR: $225 days 1-6      SNF: $0 days 1-20; $150 days 21-100 Outpatient: medical necessity     Co-Pay: $15 Home Health: medical necessity      Co-Pay: $25/day DME: 80%     Co-Pay: 20% Providers: in network  Emergency [place]    Name Relation Home Work Mobile   Tech,Lewis Spouse [phone]       Current Medical History  Patient Admitting Diagnosis: Bilateral lumbar L3  radiculopathy superimposed on bilateral foot drop, status post L-2-L3, L3-L4 laminectomies    History of Present Illness: A 65 y.o. 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 She elected to undergo L2/3 and L3/4 decompressive lam on 09/11/17by Dr. Ellene Route. Post op with ABLA and hypotension. PT/OT evaluations done today showing deficits in ability to complete ADL tasks as well as weakness with inability to stand. CIR recommended for follow up therapy.  Has been limited since Oct 2016 and was able to ambulate short distances with RW in home--used wheelchair out of home setting. Did some cooking using the rollater. She was was independent with  ADLs (sponge bathes due to  inability to get to upstairs bathroom).  Patient states that she has bilateral AFOs for foot drop that came on in October 2016.   Past Medical History  Past Medical History:  Diagnosis Date  . Arthritis    degenerative joint disease, lumbar region degeneration    . Bruises easily   . Cancer ([place]) 1989   cervical ca  . Cataract    immautre but not sure which eye  . Chronic back pain    stenosis  . Complication of anesthesia   . Diabetes mellitus without complication ([place])    takes Metformin daily  . Foot drop, bilateral   . GERD (gastroesophageal reflux disease)    takes Omeprazole daily  . Gout    takes Allopurinol daily  . High cholesterol    takes Atorvastatin daily  . Hypertension    takes Amlodipine,HCTZ, and Metoprolol daily  . Peripheral neuropathy ([place]) 03/09/2016  . Peripheral neuropathy ([place])   . PONV (postoperative nausea and vomiting)   . Spinal headache     Family History  family history includes Dementia in her sister; Heart disease in her mother; Stroke in her father.  Prior Rehab/Hospitalizations: Had [facility] therapies through [place] and then outpatient therapy  after 02/17 back surgery.  Has the patient had major surgery during 100 days prior to admission? No  Current Medications   Current Facility-Administered Medications:  .  0.9 %  sodium chloride infusion, 250 mL, Intravenous, Continuous, Kristeen Miss, MD .  acetaminophen (TYLENOL) tablet 650 mg, 650 mg, Oral, Q4H PRN **OR** acetaminophen (TYLENOL) suppository 650 mg, 650 mg, Rectal, Q4H PRN, Kristeen Miss, MD .  allopurinol (ZYLOPRIM) tablet 300 mg, 300 mg, Oral, Daily, Kristeen Miss, MD, 300 mg at 07/06/16 1018 .  alum & mag hydroxide-simeth (MAALOX/MYLANTA) 200-200-20 MG/5ML suspension 30 mL, 30 mL, Oral, Q6H PRN, Kristeen Miss, MD .  amLODipine (NORVASC) tablet 10 mg, 10 mg, Oral, Daily, Kristeen Miss, MD, 10 mg at 07/05/16 1008 .  atorvastatin (LIPITOR) tablet 10 mg, 10 mg, Oral,  Daily, Kristeen Miss, MD, 10 mg at 07/06/16 1018 .  bisacodyl (DULCOLAX) suppository 10 mg, 10 mg, Rectal, Daily PRN, Kristeen Miss, MD .  ciprofloxacin (CIPRO) tablet 500 mg, 500 mg, Oral, BID, Kristeen Miss, MD, 500 mg at 07/07/16 0829 .  docusate sodium (COLACE) capsule 100 mg, 100 mg, Oral, BID, Kristeen Miss, MD, 100 mg at 07/06/16 1018 .  hydrochlorothiazide (HYDRODIURIL) tablet 25 mg, 25 mg, Oral, Daily, Kristeen Miss, MD, 25 mg at 07/05/16 1009 .  HYDROmorphone (DILAUDID) injection 0.5-1 mg, 0.5-1 mg, Intravenous, Q2H PRN, Kristeen Miss, MD, 1 mg at 07/07/16 0031 .  menthol-cetylpyridinium (CEPACOL) lozenge 3 mg, 1 lozenge, Oral, PRN, 3 mg at 07/05/16 0556 **OR** phenol (CHLORASEPTIC) mouth spray 1 spray, 1 spray, Mouth/Throat, PRN, Kristeen Miss, MD .  metFORMIN (GLUCOPHAGE) tablet 1,000 mg, 1,000 mg, Oral, BID WC, Kristeen Miss, MD, 1,000 mg at 07/07/16 0829 .  methocarbamol (ROBAXIN) tablet 500 mg, 500 mg, Oral, Q6H PRN, 500 mg at 07/07/16 0456 **OR** methocarbamol (ROBAXIN) 500 mg in dextrose 5 % 50 mL IVPB, 500 mg, Intravenous, Q6H PRN, Kristeen Miss, MD .  metoprolol (LOPRESSOR) tablet 50 mg, 50 mg, Oral, BID, Kristeen Miss, MD, 50 mg at 07/06/16 2219 .  ondansetron (ZOFRAN) injection 4 mg, 4 mg, Intravenous, Q4H PRN, Kristeen Miss, MD, 4 mg at 07/07/16 0829 .  oxyCODONE-acetaminophen (PERCOCET/ROXICET) 5-325 MG per tablet 1-2 tablet, 1-2 tablet, Oral, Q4H PRN, Kristeen Miss, MD, 2 tablet at 07/07/16 0456 .  pantoprazole (PROTONIX) EC tablet 40 mg, 40 mg, Oral, Daily, Kristeen Miss, MD, 40 mg at 07/06/16 1018 .  polyethylene glycol (MIRALAX / GLYCOLAX) packet 17 g, 17 g, Oral, Daily PRN, Kristeen Miss, MD .  senna (SENOKOT) tablet 8.6 mg, 1 tablet, Oral, BID, Kristeen Miss, MD, 8.6 mg at 07/06/16 1017 .  sodium chloride flush (NS) 0.9 % injection 10-40 mL, 10-40 mL, Intracatheter, PRN, Kristeen Miss, MD, 10 mL at 07/05/16 0535 .  sodium chloride flush (NS) 0.9 % injection 3 mL, 3 mL, Intravenous, Q12H,  Kristeen Miss, MD, 3 mL at 07/06/16 2200 .  sodium chloride flush (NS) 0.9 % injection 3 mL, 3 mL, Intravenous, PRN, Kristeen Miss, MD .  sodium phosphate (FLEET) 7-19 GM/118ML enema 1 enema, 1 enema, Rectal, Once PRN, Kristeen Miss, MD .  valACYclovir (VALTREX) tablet 1,000 mg, 1,000 mg, Oral, Daily PRN, Kristeen Miss, MD  Patients Current Diet: Diet Carb Modified Fluid consistency: Thin; Room service appropriate? Yes  Precautions / Restrictions Precautions Precautions: Back Precaution Booklet Issued: No Precaution Comments: pt able to recall 3/3 precautions Spinal Brace: Lumbar corset, Applied in sitting position Restrictions Weight Bearing Restrictions: No   Has the patient had 2 or more falls or  a fall with injury in the past year?No  Prior Activity Level Limited Community (1-2x/wk): Went out 1-2 X a week with her husband.  Home Assistive Devices / Equipment Home Assistive Devices/Equipment: Brace (specify type) (bilat foot) Home Equipment: Toilet riser, Bedside commode, Shower seat, Cane - single point, Environmental consultant - 2 wheels, Walker - 4 wheels, Adaptive equipment  Prior Device Use: Indicate devices/aids used by the patient prior to current illness, exacerbation or injury? Manual wheelchair and Walker  Prior Functional Level Prior Function Level of Independence: Needs assistance Gait / Transfers Assistance Needed: W/C more than rollator in community.  RW in the house.` ADL's / Homemaking Assistance Needed: has been taking sponge baths lately  Self Care: Did the patient need help bathing, dressing, using the toilet or eating?  Needed some help  Indoor Mobility: Did the patient need assistance with walking from room to room (with or without device)? Independent  Stairs: Did the patient need assistance with internal or external stairs (with or without device)? Needed some help  Functional Cognition: Did the patient need help planning regular tasks such as shopping or remembering to  take medications? Independent  Current Functional Level Cognition  Overall Cognitive Status: Within Functional Limits for tasks assessed Orientation Level: Oriented X4    Extremity Assessment (includes Sensation/Coordination)  Upper Extremity Assessment: Generalized weakness  Lower Extremity Assessment: Generalized weakness    ADLs  Overall ADL's : Needs assistance/impaired Eating/Feeding: Set up, Sitting Grooming: Wash/dry face, Oral care, Set up, Sitting Grooming Details (indicate cue type and reason): attempted static standing and unable to sustain sink level. pt required sitting supported to complete task.  Upper Body Bathing: Minimal assitance Lower Body Bathing: Total assistance Upper Body Dressing : Minimal assistance Lower Body Dressing: Maximal assistance Toilet Transfer: +2 for physical assistance, Minimal assistance General ADL Comments: pt currently unable to sustain static standing at sink level for less than 1 minute. pt requires (A) to decline to chair and cues for hand placement.     Mobility  Overal bed mobility: Needs Assistance Bed Mobility: Rolling, Sidelying to Sit Rolling: Supervision (use of rail) Sidelying to sit: Min guard Sit to sidelying: Mod assist General bed mobility comments: cues for maintaining precuations    Transfers  Overall transfer level: Needs assistance Equipment used: Rolling walker (2 wheeled) Transfers: Sit to/from Stand Sit to Stand: Min assist, Mod assist, +2 physical assistance Stand pivot transfers: Mod assist, +2 physical assistance General transfer comment: min A +2 from EOB and mod +2 from recliner; cues for hand placement and technique; assist to power up into standing with pt maintaining flexed bilat knees and trunk and required facilitation at hips to achieve upright posture and assist for safe descent as pt will not reach back for surface despite max cues    Ambulation / Gait / Stairs / Wheelchair Mobility   Ambulation/Gait Ambulation/Gait assistance: Mod assist, +2 safety/equipment Ambulation Distance (Feet): 32 Feet (with seated rest break) Assistive device: Rolling walker (2 wheeled) Gait Pattern/deviations: Step-through pattern, Decreased step length - right, Decreased step length - left, Trunk flexed General Gait Details: multimodal cues for upright posture and increased bilat step length; heavy reliance on RW Gait velocity: decreased Gait velocity interpretation: Below normal speed for age/gender    Posture / Balance Balance Overall balance assessment: Needs assistance Sitting-balance support: Bilateral upper extremity supported, Feet supported Sitting balance-Leahy Scale: Fair Standing balance support: Bilateral upper extremity supported, During functional activity Standing balance-Leahy Scale: Poor Standing balance comment: requires bil UE and  unable to sustain static standing    Special needs/care consideration BiPAP/CPAP No CPM No Continuous Drip IV No Dialysis No       Life Vest No Oxygen No Special Bed No Trach Size No Wound Vac (area) No    Skin Has lumbar back incision                              Bowel mgmt: Last BM 07/06/16, loose Bladder mgmt: Voiding on bedpan and BSC Diabetic mgmt yes, on oral medications at home.   Previous Home Environment Living Arrangements: Spouse/significant other Available Help at Discharge: Family, Available 24 hours/day Type of Home: House Home Layout: Multi-level, 1/2 bath on main level, Able to live on main level with bedroom/bathroom Alternate Level Stairs-Rails: Right, Left Alternate Level Stairs-Number of Steps: 2 stairs to get to kitchen - i rail. 14 to get to second floor bedroom bilateral rails. Flight to baselemt which she does not go to. Home Access: Stairs to enter Entrance Stairs-Rails: None Entrance Stairs-Number of Steps: 1 Bathroom Shower/Tub: Engineer, mining: Yes Home  Care Services: No Additional Comments: pt has shoes with prafo x2 pairs. pt requires (A) for white pair but PTA could don black pair independently  Discharge Living Setting Plans for Discharge Living Setting: Patient's home, House, Lives with (comment) (Lives with husband.) Type of Home at Discharge: House Discharge Home Layout: Multi-level Alternate Level Stairs-Number of Steps: 2 steps den to [employer], 13 steps to basement and 13 steps to upstairs.  Stays in den with 1/2 bath available on den level.  2 stpes from den to kitchen area. Discharge Home Access: Stairs to enter Entrance Stairs-Number of Steps: 1 step entry Does the patient have any problems obtaining your medications?: No  Social/Family/Support Systems Patient Roles: Spouse, Parent (Has a husband, 2 sons and 2 daughters.) Contact Information: Christeen Douglas - spouse Anticipated Caregiver: Husband Anticipated Caregiver's Contact Information: Bobby Rumpf - husband - [phone] Ability/Limitations of Caregiver: Husband can assist and has been assisting at home. Caregiver Availability: 24/7 Discharge Plan Discussed with Primary Caregiver: Yes Is Caregiver In Agreement with Plan?: Yes Does Caregiver/Family have Issues with Lodging/Transportation while Pt is in Rehab?: No  Goals/Additional Needs Patient/Family Goal for Rehab: PT/OT mod I and supervision goals Expected length of stay: 7-10 days Cultural Considerations: None Dietary Needs: Carb mod, med cal, thin liquids Equipment Needs: TBD Pt/Family Agrees to Admission and willing to participate: Yes Program Orientation Provided & Reviewed with Pt/Caregiver Including Roles  & Responsibilities: Yes  Decrease burden of Care through IP rehab admission: N/A  Possible need for SNF placement upon discharge: Not planned  Patient Condition: This patient's medical and functional status has changed since the consult dated: 07/04/13 in which the Rehabilitation Physician determined and  documented that the patient's condition is appropriate for intensive rehabilitative care in an inpatient rehabilitation facility. See "History of Present Illness" (above) for medical update. Functional changes are: Currently requiring mod assist to ambulate 32 feet RW with seated rest break. Patient's medical and functional status update has been discussed with the Rehabilitation physician and patient remains appropriate for inpatient rehabilitation. Will admit to inpatient rehab today.   Preadmission Screen Completed By:  Cleatrice Burke, 07/07/2016 9:17 AM ______________________________________________________________________   Discussed status with Dr. Naaman Plummer on 07/07/2016 at  [phone] and received telephone approval for admission today.  Admission Coordinator:  Cleatrice Burke, time 4782 Date 07/07/2016

## 2016-07-06 NOTE — Progress Notes (Signed)
Rehab admissions - I met with patient and her husband at the bedside.  PT called to tell me that they are worried that patient cannot tolerate 3 hours of therapy a day.  Patient wants me to continue to pursue inpatient rehab stay.  I will likely not hear back from [employer] until tomorrow.  If CIR is denied, patient would then like to pursue [facility] in [place].  My partner, Pamala Hurry, will follow up tomorrow.  Call me for questions.  #[phone]

## 2016-07-06 NOTE — Progress Notes (Signed)
Patient ID: Karen Reynolds, female   DOB: 08/27/1951, 65 y.o.   MRN: [id number] Vital signs are stable Patient complains of burning in bladder since catheter has been removed She does have a history of being prone to UTIs Is also having some numbness in the right buttock and hip Her leg still feels weak as stated before surgery We'll ask physical therapy to work more diligently with her to mobilize her Start Cipro DC central line

## 2016-07-07 ENCOUNTER — Inpatient Hospital Stay (HOSPITAL_COMMUNITY)
Admission: RE | Admit: 2016-07-07 | Discharge: 2016-07-14 | DRG: 552 | Disposition: A | Discharge status: Home Health | Payer: PPO | Source: Intra-hospital | Attending: Physical Medicine & Rehabilitation | Admitting: Physical Medicine & Rehabilitation

## 2016-07-07 DIAGNOSIS — Z96653 Presence of artificial knee joint, bilateral: Secondary | ICD-10-CM

## 2016-07-07 DIAGNOSIS — R300 Dysuria: Secondary | ICD-10-CM | POA: Diagnosis not present

## 2016-07-07 DIAGNOSIS — Z79899 Other long term (current) drug therapy: Secondary | ICD-10-CM | POA: Diagnosis not present

## 2016-07-07 DIAGNOSIS — G589 Mononeuropathy, unspecified: Secondary | ICD-10-CM | POA: Diagnosis not present

## 2016-07-07 DIAGNOSIS — Z7982 Long term (current) use of aspirin: Secondary | ICD-10-CM

## 2016-07-07 DIAGNOSIS — D62 Acute posthemorrhagic anemia: Secondary | ICD-10-CM | POA: Diagnosis not present

## 2016-07-07 DIAGNOSIS — I10 Essential (primary) hypertension: Secondary | ICD-10-CM | POA: Diagnosis not present

## 2016-07-07 DIAGNOSIS — Z8541 Personal history of malignant neoplasm of cervix uteri: Secondary | ICD-10-CM

## 2016-07-07 DIAGNOSIS — M21372 Foot drop, left foot: Secondary | ICD-10-CM

## 2016-07-07 DIAGNOSIS — K219 Gastro-esophageal reflux disease without esophagitis: Secondary | ICD-10-CM | POA: Diagnosis not present

## 2016-07-07 DIAGNOSIS — R1013 Epigastric pain: Secondary | ICD-10-CM

## 2016-07-07 DIAGNOSIS — Z7984 Long term (current) use of oral hypoglycemic drugs: Secondary | ICD-10-CM

## 2016-07-07 DIAGNOSIS — M4726 Other spondylosis with radiculopathy, lumbar region: Secondary | ICD-10-CM

## 2016-07-07 DIAGNOSIS — M109 Gout, unspecified: Secondary | ICD-10-CM

## 2016-07-07 DIAGNOSIS — M4316 Spondylolisthesis, lumbar region: Secondary | ICD-10-CM

## 2016-07-07 DIAGNOSIS — G629 Polyneuropathy, unspecified: Secondary | ICD-10-CM

## 2016-07-07 DIAGNOSIS — M4806 Spinal stenosis, lumbar region: Principal | ICD-10-CM

## 2016-07-07 DIAGNOSIS — Z96622 Presence of left artificial elbow joint: Secondary | ICD-10-CM | POA: Diagnosis not present

## 2016-07-07 DIAGNOSIS — Z981 Arthrodesis status: Secondary | ICD-10-CM | POA: Diagnosis not present

## 2016-07-07 DIAGNOSIS — M21371 Foot drop, right foot: Secondary | ICD-10-CM

## 2016-07-07 DIAGNOSIS — M48061 Spinal stenosis, lumbar region without neurogenic claudication: Secondary | ICD-10-CM | POA: Diagnosis present

## 2016-07-07 DIAGNOSIS — E7800 Pure hypercholesterolemia, unspecified: Secondary | ICD-10-CM | POA: Diagnosis not present

## 2016-07-07 DIAGNOSIS — Z23 Encounter for immunization: Secondary | ICD-10-CM | POA: Diagnosis not present

## 2016-07-07 DIAGNOSIS — R197 Diarrhea, unspecified: Secondary | ICD-10-CM | POA: Diagnosis not present

## 2016-07-07 DIAGNOSIS — Z791 Long term (current) use of non-steroidal anti-inflammatories (NSAID): Secondary | ICD-10-CM

## 2016-07-07 DIAGNOSIS — M5416 Radiculopathy, lumbar region: Secondary | ICD-10-CM | POA: Diagnosis not present

## 2016-07-07 DIAGNOSIS — E1142 Type 2 diabetes mellitus with diabetic polyneuropathy: Secondary | ICD-10-CM | POA: Diagnosis not present

## 2016-07-07 LAB — GLUCOSE, CAPILLARY
GLUCOSE-CAPILLARY: 151 mg/dL — AB (ref 65–99)
Glucose-Capillary: 130 mg/dL — ABNORMAL HIGH (ref 65–99)

## 2016-07-07 MED ORDER — AMLODIPINE BESYLATE 10 MG PO TABS
10.0000 mg | ORAL_TABLET | Freq: Every day | ORAL | Status: DC
  Administered 2016-07-08 – 2016-07-14 (×7): 10 mg via ORAL
  Filled 2016-07-07 (×7): qty 1

## 2016-07-07 MED ORDER — ATORVASTATIN CALCIUM 10 MG PO TABS
10.0000 mg | ORAL_TABLET | Freq: Every day | ORAL | Status: DC
  Administered 2016-07-08 – 2016-07-14 (×7): 10 mg via ORAL
  Filled 2016-07-07 (×7): qty 1

## 2016-07-07 MED ORDER — PHENOL 1.4 % MT LIQD: 1.0000 | OROMUCOSAL | Status: DC | PRN

## 2016-07-07 MED ORDER — INFLUENZA VAC SPLIT QUAD 0.5 ML IM SUSY
0.5000 mL | PREFILLED_SYRINGE | INTRAMUSCULAR | Status: AC
  Administered 2016-07-08: 0.5 mL via INTRAMUSCULAR
  Filled 2016-07-07 (×2): qty 0.5

## 2016-07-07 MED ORDER — PROCHLORPERAZINE EDISYLATE 5 MG/ML IJ SOLN: 5.0000 mg | Freq: Four times a day (QID) | INTRAMUSCULAR | Status: DC | PRN

## 2016-07-07 MED ORDER — ALLOPURINOL 300 MG PO TABS
300.0000 mg | ORAL_TABLET | Freq: Every day | ORAL | Status: DC
  Administered 2016-07-08 – 2016-07-14 (×7): 300 mg via ORAL
  Filled 2016-07-07 (×7): qty 1

## 2016-07-07 MED ORDER — MENTHOL 3 MG MT LOZG
1.0000 | LOZENGE | OROMUCOSAL | Status: DC | PRN
  Filled 2016-07-07: qty 9

## 2016-07-07 MED ORDER — GUAIFENESIN-DM 100-10 MG/5ML PO SYRP: 5.0000 mL | ORAL_SOLUTION | Freq: Four times a day (QID) | ORAL | Status: DC | PRN

## 2016-07-07 MED ORDER — METHOCARBAMOL 500 MG PO TABS
500.0000 mg | ORAL_TABLET | Freq: Four times a day (QID) | ORAL | Status: DC | PRN
  Administered 2016-07-07 – 2016-07-08 (×2): 500 mg via ORAL
  Filled 2016-07-07 (×2): qty 1

## 2016-07-07 MED ORDER — VALACYCLOVIR HCL 500 MG PO TABS
1000.0000 mg | ORAL_TABLET | Freq: Every day | ORAL | Status: DC | PRN
  Administered 2016-07-08 – 2016-07-11 (×3): 1000 mg via ORAL
  Filled 2016-07-07 (×5): qty 2

## 2016-07-07 MED ORDER — METFORMIN HCL 500 MG PO TABS
1000.0000 mg | ORAL_TABLET | Freq: Two times a day (BID) | ORAL | Status: DC
  Administered 2016-07-07 – 2016-07-14 (×16): 1000 mg via ORAL
  Filled 2016-07-07 (×15): qty 2

## 2016-07-07 MED ORDER — PROCHLORPERAZINE MALEATE 5 MG PO TABS
5.0000 mg | ORAL_TABLET | Freq: Four times a day (QID) | ORAL | Status: DC | PRN
  Administered 2016-07-07: 10 mg via ORAL
  Administered 2016-07-09: 5 mg via ORAL
  Administered 2016-07-12: 10 mg via ORAL
  Filled 2016-07-07: qty 2
  Filled 2016-07-07: qty 1
  Filled 2016-07-07: qty 2

## 2016-07-07 MED ORDER — FLEET ENEMA 7-19 GM/118ML RE ENEM: 1.0000 | ENEMA | Freq: Once | RECTAL | Status: DC | PRN

## 2016-07-07 MED ORDER — PROCHLORPERAZINE 25 MG RE SUPP: 12.5000 mg | Freq: Four times a day (QID) | RECTAL | Status: DC | PRN

## 2016-07-07 MED ORDER — ALUM & MAG HYDROXIDE-SIMETH 200-200-20 MG/5ML PO SUSP: 30.0000 mL | ORAL | Status: DC | PRN

## 2016-07-07 MED ORDER — FAMOTIDINE 10 MG PO TABS
10.0000 mg | ORAL_TABLET | Freq: Two times a day (BID) | ORAL | Status: DC
  Administered 2016-07-07 – 2016-07-14 (×14): 10 mg via ORAL
  Filled 2016-07-07 (×14): qty 1

## 2016-07-07 MED ORDER — ACETAMINOPHEN 325 MG PO TABS: 325.0000 mg | ORAL_TABLET | ORAL | Status: DC | PRN

## 2016-07-07 MED ORDER — METOPROLOL TARTRATE 50 MG PO TABS
50.0000 mg | ORAL_TABLET | Freq: Two times a day (BID) | ORAL | Status: DC
  Administered 2016-07-07 – 2016-07-14 (×14): 50 mg via ORAL
  Filled 2016-07-07 (×14): qty 1

## 2016-07-07 MED ORDER — BISACODYL 10 MG RE SUPP: 10.0000 mg | Freq: Every day | RECTAL | Status: DC | PRN

## 2016-07-07 MED ORDER — PANTOPRAZOLE SODIUM 40 MG PO TBEC
40.0000 mg | DELAYED_RELEASE_TABLET | Freq: Every day | ORAL | Status: DC
  Administered 2016-07-08 – 2016-07-14 (×7): 40 mg via ORAL
  Filled 2016-07-07 (×7): qty 1

## 2016-07-07 MED ORDER — DIPHENHYDRAMINE HCL 12.5 MG/5ML PO ELIX: 12.5000 mg | ORAL_SOLUTION | Freq: Four times a day (QID) | ORAL | Status: DC | PRN

## 2016-07-07 MED ORDER — HYDROCHLOROTHIAZIDE 25 MG PO TABS
25.0000 mg | ORAL_TABLET | Freq: Every day | ORAL | Status: DC
  Administered 2016-07-08 – 2016-07-14 (×7): 25 mg via ORAL
  Filled 2016-07-07 (×7): qty 1

## 2016-07-07 MED ORDER — INSULIN ASPART 100 UNIT/ML ~~LOC~~ SOLN: 0.0000 [IU] | Freq: Every day | SUBCUTANEOUS | Status: DC

## 2016-07-07 MED ORDER — INSULIN ASPART 100 UNIT/ML ~~LOC~~ SOLN
0.0000 [IU] | Freq: Three times a day (TID) | SUBCUTANEOUS | Status: DC
  Administered 2016-07-07: 1 [IU] via SUBCUTANEOUS
  Administered 2016-07-08: 2 [IU] via SUBCUTANEOUS
  Administered 2016-07-08: 1 [IU] via SUBCUTANEOUS
  Administered 2016-07-09: 2 [IU] via SUBCUTANEOUS
  Administered 2016-07-09 – 2016-07-12 (×6): 1 [IU] via SUBCUTANEOUS
  Administered 2016-07-13: 2 [IU] via SUBCUTANEOUS
  Administered 2016-07-14: 1 [IU] via SUBCUTANEOUS

## 2016-07-07 MED ORDER — TRAZODONE HCL 50 MG PO TABS
25.0000 mg | ORAL_TABLET | Freq: Every evening | ORAL | Status: DC | PRN
  Administered 2016-07-13: 50 mg via ORAL
  Filled 2016-07-07: qty 1

## 2016-07-07 MED ORDER — POLYETHYLENE GLYCOL 3350 17 G PO PACK: 17.0000 g | PACK | Freq: Every day | ORAL | Status: DC | PRN

## 2016-07-07 MED ORDER — OXYCODONE-ACETAMINOPHEN 5-325 MG PO TABS
1.0000 | ORAL_TABLET | ORAL | Status: DC | PRN
  Administered 2016-07-07 – 2016-07-14 (×21): 2 via ORAL
  Filled 2016-07-07 (×22): qty 2

## 2016-07-07 MED ORDER — CIPROFLOXACIN HCL 500 MG PO TABS
500.0000 mg | ORAL_TABLET | Freq: Two times a day (BID) | ORAL | Status: AC
  Administered 2016-07-07 – 2016-07-10 (×7): 500 mg via ORAL
  Filled 2016-07-07 (×7): qty 1

## 2016-07-07 NOTE — Care Management Note (Signed)
Case Management Note  Patient Details  Name: Karen Reynolds MRN: [id number] Date of Birth: May 10, 1951  Subjective/Objective:                    Action/Plan:  DC to CIR today.   Expected Discharge Date:  07/07/16               Expected Discharge Plan:  [place]  In-House Referral:     Discharge planning Services  CM Consult  Post Acute Care Choice:    Choice offered to:     DME Arranged:    DME Agency:     HH Arranged:    HH Agency:     Status of Service:  Completed, signed off  If discussed at [employer], dates discussed:    Additional Comments:  Carles Collet, RN 07/07/2016, 10:48 AM

## 2016-07-07 NOTE — Progress Notes (Signed)
Tech transporting pt downstairs to CIR. IV still in place. rn checked all cabinets, 2 sets of braces sent with pt. Husband has suitcase. SCD and machine sent as well.

## 2016-07-07 NOTE — Progress Notes (Signed)
[employer] has approved pt for admission to inpt rehab. I met with pt at bedside and she is in agreement. I will contact Dr. Ellene Route to obtain d/c orders. RN CM and SW will be alerted. I will make the arrangements to admit pt today. [phone]

## 2016-07-07 NOTE — Progress Notes (Signed)
Report given to [facility], RN in [employer]

## 2016-07-07 NOTE — Progress Notes (Signed)
Charlett Blake, MD Physician Signed Physical Medicine and Rehabilitation  Consult Note Date of Service: 07/04/2016 3:42 PM  Related encounter: Admission (Current) from 07/03/2016 in [facility] All Collapse All   [] Hide copied text [] Hover for attribution information      Physical Medicine and Rehabilitation Consult  Reason for Consult:  Lumbar radiculopathy with peripheral neuropathy/bilateral foot drop.   Referring Physician:  Dr. Ellene Route.    HPI: Karen Reynolds is a 65 y.o. 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 She elected to undergo L2/3 and L3/4 decompressive lam on 09/11/17by Dr. Ellene Route. Post op with ABLA and hypotension. PT/OT evaluations done today showing deficits in ability to complete ADL tasks as well as weakness with inability to stand. CIR recommended for follow up therapy.     Has been limited since Oct 2016 and was able to ambulate short distances with RW in home--used wheelchair out of home setting. Did some cooking using the rollater. She was was independent with  ADLs (sponge bathes due to inability to get to upstairs bathroom)  Patient states that she has bilateral AFOs for foot drop that came on in October 2016  Review of Systems  HENT: Negative for hearing loss.   Eyes: Negative for blurred vision and double vision.  Respiratory: Negative for cough and shortness of breath.   Cardiovascular: Negative for chest pain and palpitations.  Gastrointestinal: Negative for abdominal pain, heartburn and nausea.  Genitourinary: Negative for dysuria, frequency and urgency.  Musculoskeletal: Positive for back pain.  Skin: Negative for itching and rash.  Neurological: Positive for dizziness, sensory change and speech change. Negative for headaches.    Psychiatric/Behavioral: The patient is not nervous/anxious and does not have insomnia.       Past Medical History:  Diagnosis Date  . Arthritis    degenerative joint disease, lumbar region degeneration    . Bruises easily   . Cancer ([place]) 1989   cervical ca  . Cataract    immautre but not sure which eye  . Chronic back pain    stenosis  . Complication of anesthesia   . Diabetes mellitus without complication ([place])    takes Metformin daily  . Foot drop, bilateral   . GERD (gastroesophageal reflux disease)    takes Omeprazole daily  . Gout    takes Allopurinol daily  . High cholesterol    takes Atorvastatin daily  . Hypertension    takes Amlodipine,HCTZ, and Metoprolol daily  . Peripheral neuropathy ([place]) 03/09/2016  . Peripheral neuropathy ([place])   . PONV (postoperative nausea and vomiting)   . Spinal headache          Past Surgical History:  Procedure Laterality Date  . ABDOMINAL HYSTERECTOMY    . BACK SURGERY  1980   lumbar- L5-S1- laminectomy   . CESAREAN SECTION  1988  . CHOLECYSTECTOMY    . COLONOSCOPY    . COLONOSCOPY WITH ESOPHAGOGASTRODUODENOSCOPY (EGD)    . JOINT REPLACEMENT  3 &03/2015   bilateral knee  . left arm surgery     plates and screws  . STAPEDES SURGERY Bilateral   . TOTAL ELBOW REPLACEMENT Left          Family History  Problem Relation Age of Onset  . Heart disease Mother   . Stroke Father   .  Dementia Sister     Social History:  Married--husband 8 but in good health. 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 She reports that she has never smoked. She has never used smokeless tobacco. She reports that she does not drink alcohol or use drugs.          Allergies  Allergen Reactions  . Metolazone Other (See Comments)    Severe weight loss  . Prednisone Other (See Comments)    Runs sugar up          Medications Prior to Admission  Medication Sig  Dispense Refill  . allopurinol (ZYLOPRIM) 300 MG tablet Take 300 mg by mouth daily.  3  . ALPHA LIPOIC ACID PO Take 1 tablet by mouth daily.     Marland Kitchen amLODipine (NORVASC) 10 MG tablet Take 10 mg by mouth daily.  3  . aspirin EC 81 MG tablet Take 81 mg by mouth daily.     Marland Kitchen atorvastatin (LIPITOR) 10 MG tablet Take 10 mg by mouth daily.  3  . hydrochlorothiazide (HYDRODIURIL) 25 MG tablet Take 25 mg by mouth daily.  3  . meloxicam (MOBIC) 15 MG tablet Take 15 mg by mouth daily before breakfast.   3  . metFORMIN (GLUCOPHAGE) 1000 MG tablet Take 1,000 mg by mouth 2 (two) times daily.  3  . metoprolol (LOPRESSOR) 100 MG tablet Take 50 mg by mouth 2 (two) times daily.  3  . Multiple Vitamin (MULTIVITAMIN) capsule Take 1 capsule by mouth daily.     . Omega-3 Fatty Acids (FISH OIL) 1000 MG CAPS Take 3,000 mg by mouth daily.    Marland Kitchen omeprazole (PRILOSEC) 40 MG capsule Take 40 mg by mouth daily.  3  . TURMERIC PO Take 1 tablet by mouth daily.    . Vitamin D, Cholecalciferol, 1000 units TABS Take 1,000 Units by mouth daily.     Marland Kitchen ibuprofen (ADVIL,MOTRIN) 200 MG tablet Take 800 mg by mouth daily as needed for moderate pain.     . Liniments (SALONPAS PAIN RELIEF PATCH) PADS Apply 1 patch topically daily as needed (for pain).     . valACYclovir (VALTREX) 1000 MG tablet Take 1,000 mg by mouth daily as needed (for fever blister flare ups).       Home: Home Living Family/patient expects to be discharged to:: Private residence Living Arrangements: Spouse/significant other Available Help at Discharge: Family, Available 24 hours/day Type of Home: House Home Access: Stairs to enter [employer] of Steps: 1 Entrance Stairs-Rails: None Home Layout: Multi-level, 1/2 bath on main level, Able to live on main level with bedroom/bathroom Alternate Level Stairs-Number of Steps: 2 stairs to get to kitchen - i rail. 14 to get to second floor bedroom bilateral rails. Flight to baselemt  which she does not go to. Alternate Level Stairs-Rails: Right, Left Bathroom Shower/Tub: Multimedia programmer: Standard Bathroom Accessibility: Yes Home Equipment: Toilet riser, Bedside commode, Shower seat, Cane - single point, Environmental consultant - 2 wheels, Walker - 4 wheels, Adaptive equipment Adaptive Equipment: Reacher, Long-handled shoe horn, Long-handled sponge Additional Comments: pt has shoes with prafo x2 pairs. pt requires (A) for white pair but PTA could don black pair independently  Functional History: Prior Function Level of Independence: Needs assistance Gait / Transfers Assistance Needed: W/C more than rollator in community.  RW in the house.` ADL's / Homemaking Assistance Needed: has been taking sponge baths lately Functional Status:  Mobility: Bed Mobility Overal bed mobility: Needs Assistance Bed  Mobility: Rolling, Sidelying to Sit Rolling: Mod assist (use of rail) Sidelying to sit: Min assist General bed mobility comments: in hall with PT in recliner on arrival Transfers Overall transfer level: Needs assistance Transfers: Sit to/from Stand Sit to Stand: Mod assist, +2 physical assistance Stand pivot transfers: Mod assist, +2 physical assistance General transfer comment: 2 person for safety due to pt's knees buckle when fatigued. Ambulation/Gait Ambulation/Gait assistance: Mod assist, +2 safety/equipment Ambulation Distance (Feet): 15 Feet (total over 3 trials of gait with a RW) Assistive device: Rolling walker (2 wheeled) Gait Pattern/deviations: Step-through pattern, Decreased stance time - right, Decreased step length - right, Decreased step length - left, Decreased stride length General Gait Details: weak-kneed gait with less stance time on the right.  Heavy use of RW.  Guarded at best. Gait velocity: slow Gait velocity interpretation: Below normal speed for age/gender    ADL: ADL Overall ADL's : Needs assistance/impaired Eating/Feeding: Set up,  Sitting Grooming: Wash/dry face, Oral care, Set up, Sitting Grooming Details (indicate cue type and reason): attempted static standing and unable to sustain sink level. pt required sitting supported to complete task.  Upper Body Bathing: Minimal assitance Lower Body Bathing: Total assistance Upper Body Dressing : Minimal assistance Lower Body Dressing: Maximal assistance Toilet Transfer: +2 for physical assistance, Minimal assistance General ADL Comments: pt currently unable to sustain static standing at sink level for less than 1 minute. pt requires (A) to decline to chair and cues for hand placement.   Cognition: Cognition Overall Cognitive Status: Within Functional Limits for tasks assessed Orientation Level: Oriented X4 Cognition Arousal/Alertness: Awake/alert Behavior During Therapy: WFL for tasks assessed/performed, Anxious Overall Cognitive Status: Within Functional Limits for tasks assessed   Blood pressure (!) 112/53, pulse 75, temperature 98.1 F (36.7 C), temperature source Oral, resp. rate 19, height 5\' 5"  (1.651 m), weight 103.5 kg (228 lb 3 oz), SpO2 95 %. Physical Exam  Nursing note and vitals reviewed. Constitutional: She is oriented to person, place, and time. She appears well-developed and well-nourished.  HENT:  Head: Normocephalic and atraumatic.  Mouth/Throat: Oropharynx is clear and moist.  Eyes: Conjunctivae are normal. Pupils are equal, round, and reactive to light.  Neck: Normal range of motion. Neck supple.  Cardiovascular: Normal rate and regular rhythm.   Respiratory: Effort normal and breath sounds normal. No stridor. No respiratory distress. She has no wheezes.  GI: Soft. Bowel sounds are normal. She exhibits no distension. There is no tenderness.  Musculoskeletal: She exhibits edema.  Well healed old incisions left elbow and bilateral knees.   Neurological: She is alert and oriented to person, place, and time.  Dysphonic speech. Able to follow  basic commands without difficulty.   Skin: Skin is warm and dry. She is not diaphoretic.  Motor strength is 5/5 bilateral deltoid, bicep, tricep, grip, 3 minus bilateral hip flexors 4 minus bilateral knee extensors trace bilateral ankle dorsiflexors, 2 minus bilateral ankle plantar flexors  Lab Results Last 24 Hours       Results for orders placed or performed during the hospital encounter of 07/03/16 (from the past 24 hour(s))  CBC     Status: Abnormal   Collection Time: 07/04/16  1:35 PM  Result Value Ref Range   WBC 11.0 (H) 4.0 - 10.5 K/uL   RBC 2.65 (L) 3.87 - 5.11 MIL/uL   Hemoglobin 7.2 (L) 12.0 - 15.0 g/dL   HCT 22.9 (L) 36.0 - 46.0 %   MCV 86.4 78.0 - 100.0 fL  MCH 27.2 26.0 - 34.0 pg   MCHC 31.4 30.0 - 36.0 g/dL   RDW 15.1 11.5 - 15.5 %   Platelets 221 150 - 400 K/uL      Imaging Results (Last 48 hours)  Dg Lumbar Spine 2-3 Views  Result Date: 07/03/2016 CLINICAL DATA:  Intraoperative images, posterior lumbar interbody fusion EXAM: LUMBAR SPINE - 2-3 VIEW COMPARISON:  07/03/2014 FINDINGS: Total fluoroscopy time was 33 seconds. Two low resolution intraoperative spot films of the lumbar spine are submitted for interpretation. The images demonstrate posterior stabilization rods and fixating screws extending from apparent L2 level through S1. Interbody disc devices are present from L2 through S1. IMPRESSION: Intraoperative fluoroscopy for posterior lumbar interbody fusion Electronically Signed   By: Donavan Foil M.D.   On: 07/03/2016 13:06   Dg Chest Port 1 View  Result Date: 07/03/2016 CLINICAL DATA:  Central line placement. EXAM: PORTABLE CHEST 1 VIEW COMPARISON:  08/09/2010 FINDINGS: Right internal jugular central line has its tip in the SVC 2 cm above the right atrium. No pneumothorax. There is atelectasis in both lower lungs. Cardiac silhouette remains enlarged. IMPRESSION: Central line well positioned with the tip in the SVC 2 cm above the right atrium. Areas  of atelectasis in both lower lungs. Electronically Signed   By: Nelson Chimes M.D.   On: 07/03/2016 14:03     Assessment/Plan: Diagnosis: Bilateral lumbar L3  radiculopathy superimposed on bilateral foot drop, status post L-2-L3, L3-L4 laminectomies, postoperative day #2 1. Does the need for close, 24 hr/day medical supervision in concert with the patient's rehab needs make it unreasonable for this patient to be served in a less intensive setting? Yes 2. Co-Morbidities requiring supervision/potential complications: Type 2 diabetes, gout, chronic deconditioning 3. Due to bladder management, bowel management, safety, skin/wound care, disease management, medication administration, pain management and patient education, does the patient require 24 hr/day rehab nursing? Yes 4. Does the patient require coordinated care of a physician, rehab nurse, PT (1-2 hrs/day, 5 days/week) and OT (1-2 hrs/day, 5 days/week) to address physical and functional deficits in the context of the above medical diagnosis(es)? Yes Addressing deficits in the following areas: balance, endurance, locomotion, strength, transferring, bowel/bladder control, bathing, dressing, feeding, grooming, toileting, cognition and psychosocial support 5. Can the patient actively participate in an intensive therapy program of at least 3 hrs of therapy per day at least 5 days per week? Yes 6. The potential for patient to make measurable gains while on inpatient rehab is good 7. Anticipated functional outcomes upon discharge from inpatient rehab are modified independent and supervision  with PT, modified independent and supervision with OT, n/a with SLP. 8. Estimated rehab length of stay to reach the above functional goals is: 7-10d  9. Does the patient have adequate social supports and living environment to accommodate these discharge functional goals? Yes 10. Anticipated D/C setting: Home 11. Anticipated post D/C treatments: [facility]  therapy 12. Overall Rehab/Functional Prognosis: good  RECOMMENDATIONS: This patient's condition is appropriate for continued rehabilitative care in the following setting: CIR Patient has agreed to participate in recommended program. Yes Note that insurance prior authorization may be required for reimbursement for recommended care.  Comment: Patient states she went to skilled nursing after last surgery    07/04/2016    Revision History                        Routing History

## 2016-07-07 NOTE — H&P (Signed)
Physical Medicine and Rehabilitation Admission H&P    CC:  Lumbar radiculopathy with peripheral neuropathy/bilateral foot drop.     HPI:   Karen Reynolds is a 65 y.o. 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 She elected to undergo L2/3 and L3/4 decompressive lam on 09/11/17by Dr. Ellene Route. Post op with ABLA with drop in H/H to 7.2 as well as  hypotension.  She was started on cipro 9/14 due to complaints of dysuria. Therapy ongoing and patient limited by pain in hips as well as weakness.  CIR recommended for follow up therapy.     Review of Systems  HENT: Negative for hearing loss and tinnitus.   Eyes: Negative for blurred vision and double vision.  Respiratory: Negative for cough and shortness of breath.   Cardiovascular: Negative for chest pain, palpitations and leg swelling.  Gastrointestinal: Positive for diarrhea and nausea. Negative for abdominal pain and heartburn.  Genitourinary: Negative for dysuria and urgency.  Musculoskeletal: Positive for back pain and myalgias.  Skin: Positive for itching (at incisional site).  Neurological: Positive for dizziness, sensory change, focal weakness and weakness. Negative for headaches.  Psychiatric/Behavioral: The patient has insomnia (ususally sleeps four hours. Worse in hospital).       Past Medical History:  Diagnosis Date  . Arthritis    degenerative joint disease, lumbar region degeneration    . Bruises easily   . Cancer ([place]) 1989   cervical ca  . Cataract    immautre but not sure which eye  . Chronic back pain    stenosis  . Complication of anesthesia   . Diabetes mellitus without complication ([place])    takes Metformin daily  . Foot drop, bilateral   . GERD (gastroesophageal reflux disease)    takes Omeprazole daily  . Gout    takes Allopurinol daily  . High  cholesterol    takes Atorvastatin daily  . Hypertension    takes Amlodipine,HCTZ, and Metoprolol daily  . Peripheral neuropathy ([place]) 03/09/2016  . Peripheral neuropathy ([place])   . PONV (postoperative nausea and vomiting)   . Spinal headache     Past Surgical History:  Procedure Laterality Date  . ABDOMINAL HYSTERECTOMY    . BACK SURGERY  1980   lumbar- L5-S1- laminectomy   . CESAREAN SECTION  1988  . CHOLECYSTECTOMY    . COLONOSCOPY    . COLONOSCOPY WITH ESOPHAGOGASTRODUODENOSCOPY (EGD)    . JOINT REPLACEMENT  3 &03/2015   bilateral knee  . left arm surgery     plates and screws  . STAPEDES SURGERY Bilateral   . TOTAL ELBOW REPLACEMENT Left     Family History  Problem Relation Age of Onset  . Heart disease Mother   . Stroke Father   . Dementia Sister     Social History:   Married--husband 77 but in good health. 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 She reports that she has never smoked. She has never used smokeless tobacco. She reports that she does not drink alcohol or use drugs.    Allergies  Allergen Reactions  . Metolazone Other (See Comments)    Severe weight loss  . Prednisone Other (See Comments)    Runs sugar up    Medications Prior to Admission  Medication Sig Dispense Refill  . allopurinol (ZYLOPRIM) 300 MG tablet Take 300 mg  by mouth daily.  3  . ALPHA LIPOIC ACID PO Take 1 tablet by mouth daily.     Marland Kitchen amLODipine (NORVASC) 10 MG tablet Take 10 mg by mouth daily.  3  . aspirin EC 81 MG tablet Take 81 mg by mouth daily.     Marland Kitchen atorvastatin (LIPITOR) 10 MG tablet Take 10 mg by mouth daily.  3  . hydrochlorothiazide (HYDRODIURIL) 25 MG tablet Take 25 mg by mouth daily.  3  . meloxicam (MOBIC) 15 MG tablet Take 15 mg by mouth daily before breakfast.   3  . metFORMIN (GLUCOPHAGE) 1000 MG tablet Take 1,000 mg by mouth 2 (two) times daily.  3  . metoprolol (LOPRESSOR) 100 MG tablet Take 50 mg by mouth 2 (two) times daily.  3  . Multiple  Vitamin (MULTIVITAMIN) capsule Take 1 capsule by mouth daily.     . Omega-3 Fatty Acids (FISH OIL) 1000 MG CAPS Take 3,000 mg by mouth daily.    Marland Kitchen omeprazole (PRILOSEC) 40 MG capsule Take 40 mg by mouth daily.  3  . TURMERIC PO Take 1 tablet by mouth daily.    . Vitamin D, Cholecalciferol, 1000 units TABS Take 1,000 Units by mouth daily.     Marland Kitchen ibuprofen (ADVIL,MOTRIN) 200 MG tablet Take 800 mg by mouth daily as needed for moderate pain.     . Liniments (SALONPAS PAIN RELIEF PATCH) PADS Apply 1 patch topically daily as needed (for pain).     . valACYclovir (VALTREX) 1000 MG tablet Take 1,000 mg by mouth daily as needed (for fever blister flare ups).       Home: Home Living Family/patient expects to be discharged to:: Private residence Living Arrangements: Spouse/significant other Available Help at Discharge: Family, Available 24 hours/day Type of Home: House Home Access: Stairs to enter [employer] of Steps: 1 Entrance Stairs-Rails: None Home Layout: Multi-level, 1/2 bath on main level, Able to live on main level with bedroom/bathroom Alternate Level Stairs-Number of Steps: 2 stairs to get to kitchen - i rail. 14 to get to second floor bedroom bilateral rails. Flight to baselemt which she does not go to. Alternate Level Stairs-Rails: Right, Left Bathroom Shower/Tub: Multimedia programmer: Standard Bathroom Accessibility: Yes Home Equipment: Toilet riser, Bedside commode, Shower seat, Cane - single point, Environmental consultant - 2 wheels, Walker - 4 wheels, Adaptive equipment Adaptive Equipment: Reacher, Long-handled shoe horn, Long-handled sponge Additional Comments: pt has shoes with prafo x2 pairs. pt requires (A) for white pair but PTA could don black pair independently   Functional History: Prior Function Level of Independence: Needs assistance Gait / Transfers Assistance Needed: W/C more than rollator in community.  RW in the house.` ADL's / Homemaking Assistance Needed: has  been taking sponge baths lately  Functional Status:  Mobility: Bed Mobility Overal bed mobility: Needs Assistance Bed Mobility: Rolling, Sidelying to Sit Rolling: Supervision (use of rail) Sidelying to sit: Min guard Sit to sidelying: Mod assist General bed mobility comments: cues for maintaining precuations Transfers Overall transfer level: Needs assistance Equipment used: Rolling walker (2 wheeled) Transfers: Sit to/from Stand Sit to Stand: Min assist, Mod assist, +2 physical assistance Stand pivot transfers: Mod assist, +2 physical assistance General transfer comment: min A +2 from EOB and mod +2 from recliner; cues for hand placement and technique; assist to power up into standing with pt maintaining flexed bilat knees and trunk and required facilitation at hips to achieve upright posture and assist for safe descent as pt will not  reach back for surface despite max cues Ambulation/Gait Ambulation/Gait assistance: Mod assist, +2 safety/equipment Ambulation Distance (Feet): 32 Feet (with seated rest break) Assistive device: Rolling walker (2 wheeled) Gait Pattern/deviations: Step-through pattern, Decreased step length - right, Decreased step length - left, Trunk flexed General Gait Details: multimodal cues for upright posture and increased bilat step length; heavy reliance on RW Gait velocity: decreased Gait velocity interpretation: Below normal speed for age/gender    ADL: ADL Overall ADL's : Needs assistance/impaired Eating/Feeding: Set up, Sitting Grooming: Wash/dry face, Oral care, Set up, Sitting Grooming Details (indicate cue type and reason): attempted static standing and unable to sustain sink level. pt required sitting supported to complete task.  Upper Body Bathing: Minimal assitance Lower Body Bathing: Total assistance Upper Body Dressing : Minimal assistance Lower Body Dressing: Maximal assistance Toilet Transfer: +2 for physical assistance, Minimal  assistance General ADL Comments: pt currently unable to sustain static standing at sink level for less than 1 minute. pt requires (A) to decline to chair and cues for hand placement.   Cognition: Cognition Overall Cognitive Status: Within Functional Limits for tasks assessed Orientation Level: Oriented X4 Cognition Arousal/Alertness: Awake/alert Behavior During Therapy: WFL for tasks assessed/performed, Anxious Overall Cognitive Status: Within Functional Limits for tasks assessed   Blood pressure 139/71, pulse 82, temperature 98.1 F (36.7 C), temperature source Oral, resp. rate 20, height 5\' 5"  (1.651 m), weight 103.5 kg (228 lb 3 oz), SpO2 92 %. Physical Exam  Nursing note and vitals reviewed. Constitutional: She is oriented to person, place, and time. She appears well-developed and well-nourished.  HENT:  Head: Normocephalic and atraumatic.  Right Ear: External ear normal.  Left Ear: External ear normal.  Mouth/Throat: Oropharynx is clear and moist.  Eyes: Conjunctivae are normal. Pupils are equal, round, and reactive to light. Right eye exhibits no discharge. Left eye exhibits no discharge.  Neck: Neck supple.  Cardiovascular: Normal rate and regular rhythm.  Exam reveals no friction rub.   No murmur heard. Respiratory: Effort normal and breath sounds normal. No stridor. No respiratory distress. She has no wheezes. She has no rales.  GI: Soft. Bowel sounds are normal. She exhibits no distension. There is no tenderness. There is no rebound and no guarding.  Musculoskeletal: She exhibits no edema or tenderness.  Neurological: She is alert and oriented to person, place, and time.  UE motor 5/5 prox to distal. RLE: 3/5HF, 3+ KE, 0/5 ADF, 1+ APF. LLE: 3+HF, 4-KE, 0/5 ADF, 2+ APF. Decreased sensation to LT in both feet.   Skin: Skin is warm and dry.  Back incision with evidence of drainage under honeycomb dressing--dime size dehisced/abraded area at superio aspect of incision.    Psychiatric: She has a normal mood and affect. Her behavior is normal. Judgment and thought content normal.    No results found for this or any previous visit (from the past 48 hour(s)). No results found.     Medical Problem List and Plan: 1.  Functional and mobility deficits secondary to lumbar spondylosis with stenosis and radiculopathies   -admit to inpatient rehab  -assess for most appropriate AFO's (has a set of custom carbon AFO's and an of the shelf posterior leaf set which she prefers) 2.  DVT Prophylaxis/Anticoagulation: Mechanical: Sequential compression devices, below knee Bilateral lower extremities 3. Pain Management: continue oxycodone prn 4. Mood: LCSW to follow for evaluation and sup 5. Neuropsych: This patient is capable of making decisions on his own behalf. 6. Skin/Wound Care: Monitor wound for healing.    -  dry daily dressing to superior, draining aspect of incision 7. Fluids/Electrolytes/Nutrition: Monitor I/O. Offer supplements 8. HTN: Monitor BP bid and titrate medications as indicated. Continue Norvasc, HCTZ and metoprolol with parameters. 9. T2DM: Monitor BS ac/hs. Continue metformin bid.  10. Dysuria: On cipro D# 2. Encourage fluid intake. 11. ABLA: Monitor for symptoms with increase in activity. Transfuse if symptomatic. Recheck CBC in am.  12. H/o Gout: On allopurinol. Monitor for flare up. 13. Diarrhea: Likely due to laxatives--will d/c senna and colace.  14. Dyspepsia: With N/V--will add pepcid to help manage symptoms.    Post Admission Physician Evaluation: 1. Functional deficits secondary  to lumbar surgery/radiculopathies. 2. Patient is admitted to receive collaborative, interdisciplinary care between the physiatrist, rehab nursing staff, and therapy team. 3. Patient's level of medical complexity and substantial therapy needs in context of that medical necessity cannot be provided at a lesser intensity of care such as a SNF. 4. Patient has  experienced substantial functional loss from his/her baseline which was documented above under the "Functional History" and "Functional Status" headings.  Judging by the patient's diagnosis, physical exam, and functional history, the patient has potential for functional progress which will result in measurable gains while on inpatient rehab.  These gains will be of substantial and practical use upon discharge  in facilitating mobility and self-care at the household level. 5. Physiatrist will provide 24 hour management of medical needs as well as oversight of the therapy plan/treatment and provide guidance as appropriate regarding the interaction of the two. 6. 24 hour rehab nursing will assist with bladder management, bowel management, safety, skin/wound care, disease management, medication administration, pain management and patient education  and help integrate therapy concepts, techniques,education, etc. 7. PT will assess and treat for/with: Lower extremity strength, range of motion, stamina, balance, functional mobility, safety, adaptive techniques and equipment, NMR, orthotic assessment, pain control, spinal precautions, family education.   Goals are: mod I to supervision. 8. OT will assess and treat for/with: ADL's, functional mobility, safety, upper extremity strength, adaptive techniques and equipment, NMR, back precautions, pain control, family education.   Goals are: mod I to min assist. Therapy may proceed with showering this patient if back incision is covered. 9. SLP will assess and treat for/with: n/a.  Goals are: n/a. 10. Case Management and Social Worker will assess and treat for psychological issues and discharge planning. 11. Team conference will be held weekly to assess progress toward goals and to determine barriers to discharge. 12. Patient will receive at least 3 hours of therapy per day at least 5 days per week. 13. ELOS: 12-16 days       14. Prognosis:  excellent     Meredith Staggers, MD, [facility] 07/07/2016

## 2016-07-07 NOTE — Clinical Social Work Note (Signed)
CSW consulted for New SNF. PT recommending CIR. CSW was notified that pt will admit to CIR. CSW is signing off as no further needs identified.   Darden Dates, MSW, LCSW Clinical Social Worker  [phone]

## 2016-07-07 NOTE — Interval H&P Note (Signed)
Karen Reynolds was admitted today to Inpatient Rehabilitation with the diagnosis of lumbar stenosis with radiculopathy.  The patient's history has been reviewed, patient examined, and there is no change in status.  Patient continues to be appropriate for intensive inpatient rehabilitation.  I have reviewed the patient's chart and labs.  Questions were answered to the patient's satisfaction. The PAPE has been reviewed and assessment remains appropriate.  Karen Reynolds T 07/07/2016, 6:02 PM

## 2016-07-07 NOTE — Progress Notes (Signed)
Received pt. As a transfer.Pt. And her husband were oriented to rehab unit.Safety plan was explained.Fall prevention plan was explained and signed by Pt. And the RN.Welcome video was presented.

## 2016-07-07 NOTE — Discharge Summary (Signed)
Physician Discharge Summary  Patient ID: Karen Reynolds MRN: [id number] DOB/AGE: Jan 17, 1951 65 y.o.  Admit date: 07/03/2016 Discharge date: 07/07/2016  Admission Diagnoses:spondylosis and stenosis L23 L3-4, status post decompression and fusion L4 to sacrum January 2017.  Bilateral severe lower extremity neuropathy.iabetes mellitus. Morbid obesity.  Discharge Diagnoses: spondylosis and stenosis L2-L3, L3-L4, status post decompression and fusion L4 to sacrum January 2017.  Bilateral severe lower extremity neuropathy.  Diabetes mellitus.  Morbid obesity. Active Problems:   Lumbar stenosis   Discharged Condition: fair  Hospital Course: patient was admtted to undergo surgical decompression at L2-3 and L3-4.  She tolerated surgery well.  She has profound weakness in her distal lower extremities with bilateral foot drops.  She is an excellent candidate for inpatient rehabilitation.  Consults: rehabilitation medicine  Significant Diagnostic Studies: none  Treatments: surgery: decompression and fusion L2-3 L3-4 with revision of previous hardware L4 to sacrum.  Discharge Exam: Blood pressure 139/71, pulse 82, temperature 98.1 F (36.7 C), temperature source Oral, resp. rate 20, height 5\' 5"  (1.651 m), weight 103.5 kg (228 lb 3 oz), SpO2 92 %. incision is clean and dry, motor function is intact in lower extremities.  Disposition: comprehensive inpatient rehabilitation  Discharge Instructions    Call MD for:  redness, tenderness, or signs of infection (pain, swelling, redness, odor or green/yellow discharge around incision site)    Complete by:  As directed    Call MD for:  severe uncontrolled pain    Complete by:  As directed    Call MD for:  temperature >100.4    Complete by:  As directed    Diet - low sodium heart healthy    Complete by:  As directed    Increase activity slowly    Complete by:  As directed        Medication List    TAKE these medications   allopurinol 300 MG  tablet Commonly known as:  ZYLOPRIM Take 300 mg by mouth daily.   ALPHA LIPOIC ACID PO Take 1 tablet by mouth daily.   amLODipine 10 MG tablet Commonly known as:  NORVASC Take 10 mg by mouth daily.   aspirin EC 81 MG tablet Take 81 mg by mouth daily.   atorvastatin 10 MG tablet Commonly known as:  LIPITOR Take 10 mg by mouth daily.   Fish Oil 1000 MG Caps Take 3,000 mg by mouth daily.   hydrochlorothiazide 25 MG tablet Commonly known as:  HYDRODIURIL Take 25 mg by mouth daily.   ibuprofen 200 MG tablet Commonly known as:  ADVIL,MOTRIN Take 800 mg by mouth daily as needed for moderate pain.   meloxicam 15 MG tablet Commonly known as:  MOBIC Take 15 mg by mouth daily before breakfast.   metFORMIN 1000 MG tablet Commonly known as:  GLUCOPHAGE Take 1,000 mg by mouth 2 (two) times daily.   metoprolol 100 MG tablet Commonly known as:  LOPRESSOR Take 50 mg by mouth 2 (two) times daily.   multivitamin capsule Take 1 capsule by mouth daily.   omeprazole 40 MG capsule Commonly known as:  PRILOSEC Take 40 mg by mouth daily.   SALONPAS PAIN RELIEF PATCH Pads Apply 1 patch topically daily as needed (for pain).   TURMERIC PO Take 1 tablet by mouth daily.   valACYclovir 1000 MG tablet Commonly known as:  VALTREX Take 1,000 mg by mouth daily as needed (for fever blister flare ups).   Vitamin D (Cholecalciferol) 1000 units Tabs Take 1,000 Units by mouth  daily.        Signed: Earleen Newport 07/07/2016, 10:19 AM

## 2016-07-07 NOTE — H&P (View-Only) (Signed)
Physical Medicine and Rehabilitation Admission H&P    CC:  Lumbar radiculopathy with peripheral neuropathy/bilateral foot drop.     HPI:   Karen Reynolds is a 65 y.o. 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 She elected to undergo L2/3 and L3/4 decompressive lam on 09/11/17by Dr. Ellene Route. Post op with ABLA with drop in H/H to 7.2 as well as  hypotension.  She was started on cipro 9/14 due to complaints of dysuria. Therapy ongoing and patient limited by pain in hips as well as weakness.  CIR recommended for follow up therapy.     Review of Systems  HENT: Negative for hearing loss and tinnitus.   Eyes: Negative for blurred vision and double vision.  Respiratory: Negative for cough and shortness of breath.   Cardiovascular: Negative for chest pain, palpitations and leg swelling.  Gastrointestinal: Positive for diarrhea and nausea. Negative for abdominal pain and heartburn.  Genitourinary: Negative for dysuria and urgency.  Musculoskeletal: Positive for back pain and myalgias.  Skin: Positive for itching (at incisional site).  Neurological: Positive for dizziness, sensory change, focal weakness and weakness. Negative for headaches.  Psychiatric/Behavioral: The patient has insomnia (ususally sleeps four hours. Worse in hospital).       Past Medical History:  Diagnosis Date  . Arthritis    degenerative joint disease, lumbar region degeneration    . Bruises easily   . Cancer ([place]) 1989   cervical ca  . Cataract    immautre but not sure which eye  . Chronic back pain    stenosis  . Complication of anesthesia   . Diabetes mellitus without complication ([place])    takes Metformin daily  . Foot drop, bilateral   . GERD (gastroesophageal reflux disease)    takes Omeprazole daily  . Gout    takes Allopurinol daily  . High  cholesterol    takes Atorvastatin daily  . Hypertension    takes Amlodipine,HCTZ, and Metoprolol daily  . Peripheral neuropathy ([place]) 03/09/2016  . Peripheral neuropathy ([place])   . PONV (postoperative nausea and vomiting)   . Spinal headache     Past Surgical History:  Procedure Laterality Date  . ABDOMINAL HYSTERECTOMY    . BACK SURGERY  1980   lumbar- L5-S1- laminectomy   . CESAREAN SECTION  1988  . CHOLECYSTECTOMY    . COLONOSCOPY    . COLONOSCOPY WITH ESOPHAGOGASTRODUODENOSCOPY (EGD)    . JOINT REPLACEMENT  3 &03/2015   bilateral knee  . left arm surgery     plates and screws  . STAPEDES SURGERY Bilateral   . TOTAL ELBOW REPLACEMENT Left     Family History  Problem Relation Age of Onset  . Heart disease Mother   . Stroke Father   . Dementia Sister     Social History:   Married--husband 65 but in good health. 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 She reports that she has never smoked. She has never used smokeless tobacco. She reports that she does not drink alcohol or use drugs.    Allergies  Allergen Reactions  . Metolazone Other (See Comments)    Severe weight loss  . Prednisone Other (See Comments)    Runs sugar up    Medications Prior to Admission  Medication Sig Dispense Refill  . allopurinol (ZYLOPRIM) 300 MG tablet Take 300 mg  by mouth daily.  3  . ALPHA LIPOIC ACID PO Take 1 tablet by mouth daily.     Marland Kitchen amLODipine (NORVASC) 10 MG tablet Take 10 mg by mouth daily.  3  . aspirin EC 81 MG tablet Take 81 mg by mouth daily.     Marland Kitchen atorvastatin (LIPITOR) 10 MG tablet Take 10 mg by mouth daily.  3  . hydrochlorothiazide (HYDRODIURIL) 25 MG tablet Take 25 mg by mouth daily.  3  . meloxicam (MOBIC) 15 MG tablet Take 15 mg by mouth daily before breakfast.   3  . metFORMIN (GLUCOPHAGE) 1000 MG tablet Take 1,000 mg by mouth 2 (two) times daily.  3  . metoprolol (LOPRESSOR) 100 MG tablet Take 50 mg by mouth 2 (two) times daily.  3  . Multiple  Vitamin (MULTIVITAMIN) capsule Take 1 capsule by mouth daily.     . Omega-3 Fatty Acids (FISH OIL) 1000 MG CAPS Take 3,000 mg by mouth daily.    Marland Kitchen omeprazole (PRILOSEC) 40 MG capsule Take 40 mg by mouth daily.  3  . TURMERIC PO Take 1 tablet by mouth daily.    . Vitamin D, Cholecalciferol, 1000 units TABS Take 1,000 Units by mouth daily.     Marland Kitchen ibuprofen (ADVIL,MOTRIN) 200 MG tablet Take 800 mg by mouth daily as needed for moderate pain.     . Liniments (SALONPAS PAIN RELIEF PATCH) PADS Apply 1 patch topically daily as needed (for pain).     . valACYclovir (VALTREX) 1000 MG tablet Take 1,000 mg by mouth daily as needed (for fever blister flare ups).       Home: Home Living Family/patient expects to be discharged to:: Private residence Living Arrangements: Spouse/significant other Available Help at Discharge: Family, Available 24 hours/day Type of Home: House Home Access: Stairs to enter [employer] of Steps: 1 Entrance Stairs-Rails: None Home Layout: Multi-level, 1/2 bath on main level, Able to live on main level with bedroom/bathroom Alternate Level Stairs-Number of Steps: 2 stairs to get to kitchen - i rail. 14 to get to second floor bedroom bilateral rails. Flight to baselemt which she does not go to. Alternate Level Stairs-Rails: Right, Left Bathroom Shower/Tub: Multimedia programmer: Standard Bathroom Accessibility: Yes Home Equipment: Toilet riser, Bedside commode, Shower seat, Cane - single point, Environmental consultant - 2 wheels, Walker - 4 wheels, Adaptive equipment Adaptive Equipment: Reacher, Long-handled shoe horn, Long-handled sponge Additional Comments: pt has shoes with prafo x2 pairs. pt requires (A) for white pair but PTA could don black pair independently   Functional History: Prior Function Level of Independence: Needs assistance Gait / Transfers Assistance Needed: W/C more than rollator in community.  RW in the house.` ADL's / Homemaking Assistance Needed: has  been taking sponge baths lately  Functional Status:  Mobility: Bed Mobility Overal bed mobility: Needs Assistance Bed Mobility: Rolling, Sidelying to Sit Rolling: Supervision (use of rail) Sidelying to sit: Min guard Sit to sidelying: Mod assist General bed mobility comments: cues for maintaining precuations Transfers Overall transfer level: Needs assistance Equipment used: Rolling walker (2 wheeled) Transfers: Sit to/from Stand Sit to Stand: Min assist, Mod assist, +2 physical assistance Stand pivot transfers: Mod assist, +2 physical assistance General transfer comment: min A +2 from EOB and mod +2 from recliner; cues for hand placement and technique; assist to power up into standing with pt maintaining flexed bilat knees and trunk and required facilitation at hips to achieve upright posture and assist for safe descent as pt will not  reach back for surface despite max cues Ambulation/Gait Ambulation/Gait assistance: Mod assist, +2 safety/equipment Ambulation Distance (Feet): 32 Feet (with seated rest break) Assistive device: Rolling walker (2 wheeled) Gait Pattern/deviations: Step-through pattern, Decreased step length - right, Decreased step length - left, Trunk flexed General Gait Details: multimodal cues for upright posture and increased bilat step length; heavy reliance on RW Gait velocity: decreased Gait velocity interpretation: Below normal speed for age/gender    ADL: ADL Overall ADL's : Needs assistance/impaired Eating/Feeding: Set up, Sitting Grooming: Wash/dry face, Oral care, Set up, Sitting Grooming Details (indicate cue type and reason): attempted static standing and unable to sustain sink level. pt required sitting supported to complete task.  Upper Body Bathing: Minimal assitance Lower Body Bathing: Total assistance Upper Body Dressing : Minimal assistance Lower Body Dressing: Maximal assistance Toilet Transfer: +2 for physical assistance, Minimal  assistance General ADL Comments: pt currently unable to sustain static standing at sink level for less than 1 minute. pt requires (A) to decline to chair and cues for hand placement.   Cognition: Cognition Overall Cognitive Status: Within Functional Limits for tasks assessed Orientation Level: Oriented X4 Cognition Arousal/Alertness: Awake/alert Behavior During Therapy: WFL for tasks assessed/performed, Anxious Overall Cognitive Status: Within Functional Limits for tasks assessed   Blood pressure 139/71, pulse 82, temperature 98.1 F (36.7 C), temperature source Oral, resp. rate 20, height 5\' 5"  (1.651 m), weight 103.5 kg (228 lb 3 oz), SpO2 92 %. Physical Exam  Nursing note and vitals reviewed. Constitutional: She is oriented to person, place, and time. She appears well-developed and well-nourished.  HENT:  Head: Normocephalic and atraumatic.  Right Ear: External ear normal.  Left Ear: External ear normal.  Mouth/Throat: Oropharynx is clear and moist.  Eyes: Conjunctivae are normal. Pupils are equal, round, and reactive to light. Right eye exhibits no discharge. Left eye exhibits no discharge.  Neck: Neck supple.  Cardiovascular: Normal rate and regular rhythm.  Exam reveals no friction rub.   No murmur heard. Respiratory: Effort normal and breath sounds normal. No stridor. No respiratory distress. She has no wheezes. She has no rales.  GI: Soft. Bowel sounds are normal. She exhibits no distension. There is no tenderness. There is no rebound and no guarding.  Musculoskeletal: She exhibits no edema or tenderness.  Neurological: She is alert and oriented to person, place, and time.  UE motor 5/5 prox to distal. RLE: 3/5HF, 3+ KE, 0/5 ADF, 1+ APF. LLE: 3+HF, 4-KE, 0/5 ADF, 2+ APF. Decreased sensation to LT in both feet.   Skin: Skin is warm and dry.  Back incision with evidence of drainage under honeycomb dressing--dime size dehisced/abraded area at superio aspect of incision.    Psychiatric: She has a normal mood and affect. Her behavior is normal. Judgment and thought content normal.    No results found for this or any previous visit (from the past 48 hour(s)). No results found.     Medical Problem List and Plan: 1.  Functional and mobility deficits secondary to lumbar spondylosis with stenosis and radiculopathies   -admit to inpatient rehab  -assess for most appropriate AFO's (has a set of custom carbon AFO's and an of the shelf posterior leaf set which she prefers) 2.  DVT Prophylaxis/Anticoagulation: Mechanical: Sequential compression devices, below knee Bilateral lower extremities 3. Pain Management: continue oxycodone prn 4. Mood: LCSW to follow for evaluation and sup 5. Neuropsych: This patient is capable of making decisions on his own behalf. 6. Skin/Wound Care: Monitor wound for healing.    -  dry daily dressing to superior, draining aspect of incision 7. Fluids/Electrolytes/Nutrition: Monitor I/O. Offer supplements 8. HTN: Monitor BP bid and titrate medications as indicated. Continue Norvasc, HCTZ and metoprolol with parameters. 9. T2DM: Monitor BS ac/hs. Continue metformin bid.  10. Dysuria: On cipro D# 2. Encourage fluid intake. 11. ABLA: Monitor for symptoms with increase in activity. Transfuse if symptomatic. Recheck CBC in am.  12. H/o Gout: On allopurinol. Monitor for flare up. 13. Diarrhea: Likely due to laxatives--will d/c senna and colace.  14. Dyspepsia: With N/V--will add pepcid to help manage symptoms.    Post Admission Physician Evaluation: 1. Functional deficits secondary  to lumbar surgery/radiculopathies. 2. Patient is admitted to receive collaborative, interdisciplinary care between the physiatrist, rehab nursing staff, and therapy team. 3. Patient's level of medical complexity and substantial therapy needs in context of that medical necessity cannot be provided at a lesser intensity of care such as a SNF. 4. Patient has  experienced substantial functional loss from his/her baseline which was documented above under the "Functional History" and "Functional Status" headings.  Judging by the patient's diagnosis, physical exam, and functional history, the patient has potential for functional progress which will result in measurable gains while on inpatient rehab.  These gains will be of substantial and practical use upon discharge  in facilitating mobility and self-care at the household level. 5. Physiatrist will provide 24 hour management of medical needs as well as oversight of the therapy plan/treatment and provide guidance as appropriate regarding the interaction of the two. 6. 24 hour rehab nursing will assist with bladder management, bowel management, safety, skin/wound care, disease management, medication administration, pain management and patient education  and help integrate therapy concepts, techniques,education, etc. 7. PT will assess and treat for/with: Lower extremity strength, range of motion, stamina, balance, functional mobility, safety, adaptive techniques and equipment, NMR, orthotic assessment, pain control, spinal precautions, family education.   Goals are: mod I to supervision. 8. OT will assess and treat for/with: ADL's, functional mobility, safety, upper extremity strength, adaptive techniques and equipment, NMR, back precautions, pain control, family education.   Goals are: mod I to min assist. Therapy may proceed with showering this patient if back incision is covered. 9. SLP will assess and treat for/with: n/a.  Goals are: n/a. 10. Case Management and Social Worker will assess and treat for psychological issues and discharge planning. 11. Team conference will be held weekly to assess progress toward goals and to determine barriers to discharge. 12. Patient will receive at least 3 hours of therapy per day at least 5 days per week. 13. ELOS: 12-16 days       14. Prognosis:  excellent     Meredith Staggers, MD, [facility] 07/07/2016

## 2016-07-07 NOTE — Progress Notes (Signed)
Cristina Gong, RN Rehab Admission Coordinator Signed Physical Medicine and Rehabilitation  PMR Pre-admission Date of Service: 07/06/2016 3:38 PM  Related encounter: Admission (Current) from 07/03/2016 in [place]       [] Hide copied text PMR Admission Coordinator Pre-Admission Assessment  Patient: Karen Reynolds is an 65 y.o., female MRN: [id number] DOB: 10/26/1950 Height: 5' 5"  (165.1 cm) Weight: 103.5 kg (228 lb 3 oz)                                                                                                                                                  Insurance Information HMO:     PPO: Yes     PCP:       IPA:       80/20:       OTHER: Group # [id number]  PRIMARY: [employer]      Policy#: [id number]      Subscriber: Bonner Puna CM Name: Maretta Bees    Phone#: [phone]     Fax#: [phone] Pre-Cert#: [id number] approved for 7 days     Employer: Retired Benefits:  Phone #: [phone]     Name: Candelaria Stagers. Date: 10/24/15     Deduct:  $0      Out of Pocket Max: $3400 (met [phone])      Life Max: unlimited CIR: $225 days 1-6      SNF: $0 days 1-20; $150 days 21-100 Outpatient: medical necessity     Co-Pay: $15 Home Health: medical necessity      Co-Pay: $25/day DME: 80%     Co-Pay: 20% Providers: in network  Emergency Contact Information        Contact Information    Name Relation Home Work Mobile   Blatz,Lewis Spouse [phone]       Current Medical History  Patient Admitting Diagnosis:Bilateral lumbar L3 radiculopathy superimposed on bilateral foot drop, status post L-2-L3, L3-L4 laminectomies    History of Present Illness: A 65 y.o.femalewith history of T2DM, gout, lumbar decompression with fusion L4-S1, multiple mononeuropathies, LBP with radiculopathy and progressive foot drop due to 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 She elected to undergo L2/3 and L3/4 decompressive lam on  09/11/17by Dr. Ellene Route. Post op with ABLA and hypotension. PT/OT evaluations done today showing deficits in ability to complete ADL tasks as well as weakness with inability to stand. CIR recommended for follow up therapy.  Has been limited since Oct 2016 and was able to ambulate short distanceswith RW in home--used wheelchair out of home setting. Did some cooking using the rollater. She was was independent with ADLs (sponge bathes due to inability to get to upstairs bathroom).  Patient states that she has bilateral AFOs for foot drop that came on in October 2016.   Past Medical History      Past  Medical History:  Diagnosis Date  . Arthritis    degenerative joint disease, lumbar region degeneration    . Bruises easily   . Cancer ([place]) 1989   cervical ca  . Cataract    immautre but not sure which eye  . Chronic back pain    stenosis  . Complication of anesthesia   . Diabetes mellitus without complication ([place])    takes Metformin daily  . Foot drop, bilateral   . GERD (gastroesophageal reflux disease)    takes Omeprazole daily  . Gout    takes Allopurinol daily  . High cholesterol    takes Atorvastatin daily  . Hypertension    takes Amlodipine,HCTZ, and Metoprolol daily  . Peripheral neuropathy ([place]) 03/09/2016  . Peripheral neuropathy ([place])   . PONV (postoperative nausea and vomiting)   . Spinal headache     Family History  family history includes Dementia in her sister; Heart disease in her mother; Stroke in her father.  Prior Rehab/Hospitalizations: Had [facility] therapies through [place] and then outpatient therapy after 02/17 back surgery.  Has the patient had major surgery during 100 days prior to admission? No  Current Medications   Current Facility-Administered Medications:  .  0.9 %  sodium chloride infusion, 250 mL, Intravenous, Continuous, Kristeen Miss, MD .  acetaminophen (TYLENOL) tablet 650 mg, 650 mg, Oral, Q4H PRN  **OR** acetaminophen (TYLENOL) suppository 650 mg, 650 mg, Rectal, Q4H PRN, Kristeen Miss, MD .  allopurinol (ZYLOPRIM) tablet 300 mg, 300 mg, Oral, Daily, Kristeen Miss, MD, 300 mg at 07/06/16 1018 .  alum & mag hydroxide-simeth (MAALOX/MYLANTA) 200-200-20 MG/5ML suspension 30 mL, 30 mL, Oral, Q6H PRN, Kristeen Miss, MD .  amLODipine (NORVASC) tablet 10 mg, 10 mg, Oral, Daily, Kristeen Miss, MD, 10 mg at 07/05/16 1008 .  atorvastatin (LIPITOR) tablet 10 mg, 10 mg, Oral, Daily, Kristeen Miss, MD, 10 mg at 07/06/16 1018 .  bisacodyl (DULCOLAX) suppository 10 mg, 10 mg, Rectal, Daily PRN, Kristeen Miss, MD .  ciprofloxacin (CIPRO) tablet 500 mg, 500 mg, Oral, BID, Kristeen Miss, MD, 500 mg at 07/07/16 0829 .  docusate sodium (COLACE) capsule 100 mg, 100 mg, Oral, BID, Kristeen Miss, MD, 100 mg at 07/06/16 1018 .  hydrochlorothiazide (HYDRODIURIL) tablet 25 mg, 25 mg, Oral, Daily, Kristeen Miss, MD, 25 mg at 07/05/16 1009 .  HYDROmorphone (DILAUDID) injection 0.5-1 mg, 0.5-1 mg, Intravenous, Q2H PRN, Kristeen Miss, MD, 1 mg at 07/07/16 0031 .  menthol-cetylpyridinium (CEPACOL) lozenge 3 mg, 1 lozenge, Oral, PRN, 3 mg at 07/05/16 0556 **OR** phenol (CHLORASEPTIC) mouth spray 1 spray, 1 spray, Mouth/Throat, PRN, Kristeen Miss, MD .  metFORMIN (GLUCOPHAGE) tablet 1,000 mg, 1,000 mg, Oral, BID WC, Kristeen Miss, MD, 1,000 mg at 07/07/16 0829 .  methocarbamol (ROBAXIN) tablet 500 mg, 500 mg, Oral, Q6H PRN, 500 mg at 07/07/16 0456 **OR** methocarbamol (ROBAXIN) 500 mg in dextrose 5 % 50 mL IVPB, 500 mg, Intravenous, Q6H PRN, Kristeen Miss, MD .  metoprolol (LOPRESSOR) tablet 50 mg, 50 mg, Oral, BID, Kristeen Miss, MD, 50 mg at 07/06/16 2219 .  ondansetron (ZOFRAN) injection 4 mg, 4 mg, Intravenous, Q4H PRN, Kristeen Miss, MD, 4 mg at 07/07/16 0829 .  oxyCODONE-acetaminophen (PERCOCET/ROXICET) 5-325 MG per tablet 1-2 tablet, 1-2 tablet, Oral, Q4H PRN, Kristeen Miss, MD, 2 tablet at 07/07/16 0456 .  pantoprazole (PROTONIX) EC  tablet 40 mg, 40 mg, Oral, Daily, Kristeen Miss, MD, 40 mg at 07/06/16 1018 .  polyethylene glycol (MIRALAX / GLYCOLAX) packet  17 g, 17 g, Oral, Daily PRN, Kristeen Miss, MD .  senna (SENOKOT) tablet 8.6 mg, 1 tablet, Oral, BID, Kristeen Miss, MD, 8.6 mg at 07/06/16 1017 .  sodium chloride flush (NS) 0.9 % injection 10-40 mL, 10-40 mL, Intracatheter, PRN, Kristeen Miss, MD, 10 mL at 07/05/16 0535 .  sodium chloride flush (NS) 0.9 % injection 3 mL, 3 mL, Intravenous, Q12H, Kristeen Miss, MD, 3 mL at 07/06/16 2200 .  sodium chloride flush (NS) 0.9 % injection 3 mL, 3 mL, Intravenous, PRN, Kristeen Miss, MD .  sodium phosphate (FLEET) 7-19 GM/118ML enema 1 enema, 1 enema, Rectal, Once PRN, Kristeen Miss, MD .  valACYclovir (VALTREX) tablet 1,000 mg, 1,000 mg, Oral, Daily PRN, Kristeen Miss, MD  Patients Current Diet: Diet Carb Modified Fluid consistency: Thin; Room service appropriate? Yes  Precautions / Restrictions Precautions Precautions: Back Precaution Booklet Issued: No Precaution Comments: pt able to recall 3/3 precautions Spinal Brace: Lumbar corset, Applied in sitting position Restrictions Weight Bearing Restrictions: No   Has the patient had 2 or more falls or a fall with injury in the past year?No  Prior Activity Level Limited Community (1-2x/wk): Went out 1-2 X a week with her husband.  Home Assistive Devices / Equipment Home Assistive Devices/Equipment: Brace (specify type) (bilat foot) Home Equipment: Toilet riser, Bedside commode, Shower seat, Cane - single point, Environmental consultant - 2 wheels, Walker - 4 wheels, Adaptive equipment  Prior Device Use: Indicate devices/aids used by the patient prior to current illness, exacerbation or injury? Manual wheelchair and Walker  Prior Functional Level Prior Function Level of Independence: Needs assistance Gait / Transfers Assistance Needed: W/C more than rollator in community.  RW in the house.` ADL's / Homemaking Assistance Needed: has been  taking sponge baths lately  Self Care: Did the patient need help bathing, dressing, using the toilet or eating?  Needed some help  Indoor Mobility: Did the patient need assistance with walking from room to room (with or without device)? Independent  Stairs: Did the patient need assistance with internal or external stairs (with or without device)? Needed some help  Functional Cognition: Did the patient need help planning regular tasks such as shopping or remembering to take medications? Independent  Current Functional Level Cognition Overall Cognitive Status: Within Functional Limits for tasks assessed Orientation Level: Oriented X4    Extremity Assessment (includes Sensation/Coordination) Upper Extremity Assessment: Generalized weakness  Lower Extremity Assessment: Generalized weakness   ADLs Overall ADL's : Needs assistance/impaired Eating/Feeding: Set up, Sitting Grooming: Wash/dry face, Oral care, Set up, Sitting Grooming Details (indicate cue type and reason): attempted static standing and unable to sustain sink level. pt required sitting supported to complete task.  Upper Body Bathing: Minimal assitance Lower Body Bathing: Total assistance Upper Body Dressing : Minimal assistance Lower Body Dressing: Maximal assistance Toilet Transfer: +2 for physical assistance, Minimal assistance General ADL Comments: pt currently unable to sustain static standing at sink level for less than 1 minute. pt requires (A) to decline to chair and cues for hand placement.    Mobility Overal bed mobility: Needs Assistance Bed Mobility: Rolling, Sidelying to Sit Rolling: Supervision (use of rail) Sidelying to sit: Min guard Sit to sidelying: Mod assist General bed mobility comments: cues for maintaining precuations   Transfers Overall transfer level: Needs assistance Equipment used: Rolling walker (2 wheeled) Transfers: Sit to/from Stand Sit to Stand: Min assist, Mod assist, +2 physical  assistance Stand pivot transfers: Mod assist, +2 physical assistance General transfer comment: min A +2  from EOB and mod +2 from recliner; cues for hand placement and technique; assist to power up into standing with pt maintaining flexed bilat knees and trunk and required facilitation at hips to achieve upright posture and assist for safe descent as pt will not reach back for surface despite max cues   Ambulation / Gait / Stairs / Wheelchair Mobility Ambulation/Gait Ambulation/Gait assistance: Mod assist, +2 safety/equipment Ambulation Distance (Feet): 32 Feet (with seated rest break) Assistive device: Rolling walker (2 wheeled) Gait Pattern/deviations: Step-through pattern, Decreased step length - right, Decreased step length - left, Trunk flexed General Gait Details: multimodal cues for upright posture and increased bilat step length; heavy reliance on RW Gait velocity: decreased Gait velocity interpretation: Below normal speed for age/gender   Posture / Balance Balance Overall balance assessment: Needs assistance Sitting-balance support: Bilateral upper extremity supported, Feet supported Sitting balance-Leahy Scale: Fair Standing balance support: Bilateral upper extremity supported, During functional activity Standing balance-Leahy Scale: Poor Standing balance comment: requires bil UE and unable to sustain static standing   Special needs/care consideration BiPAP/CPAP No CPM No Continuous Drip IV No Dialysis No       Life Vest No Oxygen No Special Bed No Trach Size No Wound Vac (area) No    Skin Has lumbar back incision                              Bowel mgmt: Last BM 07/06/16, loose Bladder mgmt: Voiding on bedpan and BSC Diabetic mgmt yes, on oral medications at home.   Previous Home Environment Living Arrangements: Spouse/significant other Available Help at Discharge: Family, Available 24 hours/day Type of Home: House Home Layout: Multi-level, 1/2 bath on main level, Able  to live on main level with bedroom/bathroom Alternate Level Stairs-Rails: Right, Left Alternate Level Stairs-Number of Steps: 2 stairs to get to kitchen - i rail. 14 to get to second floor bedroom bilateral rails. Flight to baselemt which she does not go to. Home Access: Stairs to enter Entrance Stairs-Rails: None Entrance Stairs-Number of Steps: 1 Bathroom Shower/Tub: Engineer, mining: Yes Home Care Services: No Additional Comments: pt has shoes with prafo x2 pairs. pt requires (A) for white pair but PTA could don black pair independently  Discharge Living Setting Plans for Discharge Living Setting: Patient's home, House, Lives with (comment) (Lives with husband.) Type of Home at Discharge: House Discharge Home Layout: Multi-level Alternate Level Stairs-Number of Steps: 2 steps den to [employer], 13 steps to basement and 13 steps to upstairs.  Stays in den with 1/2 bath available on den level.  2 stpes from den to kitchen area. Discharge Home Access: Stairs to enter Entrance Stairs-Number of Steps: 1 step entry Does the patient have any problems obtaining your medications?: No  Social/Family/Support Systems Patient Roles: Spouse, Parent (Has a husband, 2 sons and 2 daughters.) Contact Information: Christeen Douglas - spouse Anticipated Caregiver: Husband Anticipated Caregiver's Contact Information: Bobby Rumpf - husband - [phone] Ability/Limitations of Caregiver: Husband can assist and has been assisting at home. Caregiver Availability: 24/7 Discharge Plan Discussed with Primary Caregiver: Yes Is Caregiver In Agreement with Plan?: Yes Does Caregiver/Family have Issues with Lodging/Transportation while Pt is in Rehab?: No  Goals/Additional Needs Patient/Family Goal for Rehab: PT/OT mod I and supervision goals Expected length of stay: 7-10 days Cultural Considerations: None Dietary Needs: Carb mod, med cal, thin liquids Equipment Needs:  TBD Pt/Family Agrees to Admission and willing to participate:  Yes Program Orientation Provided & Reviewed with Pt/Caregiver Including Roles  & Responsibilities: Yes  Decrease burden of Care through IP rehab admission: N/A  Possible need for SNF placement upon discharge: Not planned  Patient Condition: This patient's medical and functional status has changed since the consult dated: 07/04/13 in which the Rehabilitation Physician determined and documented that the patient's condition is appropriate for intensive rehabilitative care in an inpatient rehabilitation facility. See "History of Present Illness" (above) for medical update. Functional changes are: Currently requiring mod assist to ambulate 32 feet RW with seated rest break. Patient's medical and functional status update has been discussed with the Rehabilitation physician and patient remains appropriate for inpatient rehabilitation. Will admit to inpatient rehab today.   Preadmission Screen Completed By:  Cleatrice Burke, 07/07/2016 9:17 AM ______________________________________________________________________   Discussed status with Dr. Naaman Plummer on 07/07/2016 at  [phone] and received telephone approval for admission today.  Admission Coordinator:  Cleatrice Burke, time 0172 Date 07/07/2016       Cosigned by: Meredith Staggers, MD at 07/07/2016 9:19 AM  Revision History

## 2016-07-08 ENCOUNTER — Inpatient Hospital Stay (HOSPITAL_COMMUNITY): Payer: PPO | Admitting: Occupational Therapy

## 2016-07-08 ENCOUNTER — Inpatient Hospital Stay (HOSPITAL_COMMUNITY): Payer: PPO | Admitting: *Deleted

## 2016-07-08 DIAGNOSIS — G589 Mononeuropathy, unspecified: Secondary | ICD-10-CM | POA: Diagnosis not present

## 2016-07-08 DIAGNOSIS — M4806 Spinal stenosis, lumbar region: Secondary | ICD-10-CM | POA: Diagnosis not present

## 2016-07-08 DIAGNOSIS — I10 Essential (primary) hypertension: Secondary | ICD-10-CM | POA: Diagnosis not present

## 2016-07-08 DIAGNOSIS — M5416 Radiculopathy, lumbar region: Secondary | ICD-10-CM | POA: Diagnosis not present

## 2016-07-08 LAB — COMPREHENSIVE METABOLIC PANEL
ALBUMIN: 2.7 g/dL — AB (ref 3.5–5.0)
ALK PHOS: 85 U/L (ref 38–126)
ALT: 14 U/L (ref 14–54)
ANION GAP: 10 (ref 5–15)
AST: 25 U/L (ref 15–41)
BUN: 12 mg/dL (ref 6–20)
CHLORIDE: 102 mmol/L (ref 101–111)
CO2: 27 mmol/L (ref 22–32)
Calcium: 8.1 mg/dL — ABNORMAL LOW (ref 8.9–10.3)
Creatinine, Ser: 1.31 mg/dL — ABNORMAL HIGH (ref 0.44–1.00)
GFR calc Af Amer: 48 mL/min — ABNORMAL LOW (ref >60)
GFR calc non Af Amer: 41 mL/min — ABNORMAL LOW (ref >60)
GLUCOSE: 137 mg/dL — AB (ref 65–99)
POTASSIUM: 3.5 mmol/L (ref 3.5–5.1)
SODIUM: 139 mmol/L (ref 135–145)
Total Bilirubin: 0.5 mg/dL (ref 0.3–1.2)
Total Protein: 5.7 g/dL — ABNORMAL LOW (ref 6.5–8.1)

## 2016-07-08 LAB — CBC WITH DIFFERENTIAL/PLATELET
BASOS PCT: 0 %
Basophils Absolute: 0 10*3/uL (ref 0.0–0.1)
EOS ABS: 0.2 10*3/uL (ref 0.0–0.7)
EOS PCT: 3 %
HCT: 23.1 % — ABNORMAL LOW (ref 36.0–46.0)
HEMOGLOBIN: 7 g/dL — AB (ref 12.0–15.0)
Lymphocytes Relative: 22 %
Lymphs Abs: 1.7 10*3/uL (ref 0.7–4.0)
MCH: 26.7 pg (ref 26.0–34.0)
MCHC: 30.3 g/dL (ref 30.0–36.0)
MCV: 88.2 fL (ref 78.0–100.0)
MONOS PCT: 7 %
Monocytes Absolute: 0.5 10*3/uL (ref 0.1–1.0)
NEUTROS PCT: 68 %
Neutro Abs: 5 10*3/uL (ref 1.7–7.7)
PLATELETS: 275 10*3/uL (ref 150–400)
RBC: 2.62 MIL/uL — ABNORMAL LOW (ref 3.87–5.11)
RDW: 15.8 % — AB (ref 11.5–15.5)
WBC: 7.4 10*3/uL (ref 4.0–10.5)

## 2016-07-08 LAB — GLUCOSE, CAPILLARY
GLUCOSE-CAPILLARY: 102 mg/dL — AB (ref 65–99)
GLUCOSE-CAPILLARY: 154 mg/dL — AB (ref 65–99)
GLUCOSE-CAPILLARY: 132 mg/dL — AB (ref 65–99)
Glucose-Capillary: 129 mg/dL — ABNORMAL HIGH (ref 65–99)

## 2016-07-08 NOTE — Evaluation (Signed)
Occupational Therapy Assessment and Plan  Patient Details  Name: Karen Reynolds MRN: [id number] Date of Birth: 07-Jun-1951  OT Diagnosis: abnormal posture, acute pain, muscle weakness (generalized) and coordination disorder Rehab Potential: Rehab Potential (ACUTE ONLY): Good ELOS: 7-10 days   Today's Date: 07/08/2016 OT Individual Time: 2919-1660 and 1400-1430 OT Individual Time Calculation (min): 57 min and 30 min      Problem List: Patient Active Problem List   Diagnosis Date Noted  . Lumbar radiculopathy 07/07/2016  . Essential hypertension 07/07/2016  . Lumbar stenosis 07/03/2016  . Peripheral neuropathy ([place]) 03/09/2016  . Spondylolisthesis of lumbar region 11/23/2015    Past Medical History:  Past Medical History:  Diagnosis Date  . Arthritis    degenerative joint disease, lumbar region degeneration    . Bruises easily   . Cancer ([place]) 1989   cervical ca  . Cataract    immautre but not sure which eye  . Chronic back pain    stenosis  . Complication of anesthesia   . Diabetes mellitus without complication ([place])    takes Metformin daily  . Foot drop, bilateral   . GERD (gastroesophageal reflux disease)    takes Omeprazole daily  . Gout    takes Allopurinol daily  . High cholesterol    takes Atorvastatin daily  . Hypertension    takes Amlodipine,HCTZ, and Metoprolol daily  . Peripheral neuropathy ([place]) 03/09/2016  . Peripheral neuropathy ([place])   . PONV (postoperative nausea and vomiting)   . Spinal headache    Past Surgical History:  Past Surgical History:  Procedure Laterality Date  . ABDOMINAL HYSTERECTOMY    . BACK SURGERY  1980   lumbar- L5-S1- laminectomy   . CESAREAN SECTION  1988  . CHOLECYSTECTOMY    . COLONOSCOPY    . COLONOSCOPY WITH ESOPHAGOGASTRODUODENOSCOPY (EGD)    . JOINT REPLACEMENT  3 &03/2015   bilateral knee  . left arm surgery     plates and screws  . STAPEDES SURGERY Bilateral   . TOTAL ELBOW REPLACEMENT Left     Assessment &  Plan Clinical Impression: Patient is a 65 y.o. year old female with history of T2DM, gout, lumbar decompression with fusion L4-S1, multiple mononeuropathies, LBP with radiculopathy and progressive foot drop due to 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 She elected to undergo L2/3 and L3/4 decompressive lam on 09/11/17by Dr. Ellene Route. Post op with ABLA with drop in H/H to 7.2 as well as  hypotension.  She was started on cipro 9/14 due to complaints of dysuria. Therapy ongoing and patient limited by pain in hips as well as weakness.  CIR recommended for follow up therapy   Patient transferred to CIR on 07/07/2016 .    Patient currently requires mod with basic self-care skills secondary to muscle weakness, decreased cardiorespiratoy endurance and decreased sitting balance, decreased standing balance and precautions.  Prior to hospitalization, patient could complete ADLs with modified independent .  Patient will benefit from skilled intervention to increase independence with basic self-care skills and increase level of independence with iADL prior to discharge home with care partner.  Anticipate patient will require increased time and use of adaptive equipment and follow up outpatient.  OT - End of Session Activity Tolerance: Decreased this session Endurance Deficit: Yes Endurance Deficit Description: multiple rest breaks secondary to fatigue OT Assessment Rehab Potential (ACUTE ONLY): Good OT Patient demonstrates impairments in the following area(s): Balance;Endurance;Motor;Pain;Safety OT Basic ADL's Functional Problem(s): Grooming;Bathing;Dressing;Toileting OT Advanced ADL's Functional Problem(s): Simple Meal  Preparation;Laundry OT Transfers Functional Problem(s): Toilet OT Additional Impairment(s): None OT Plan OT Intensity: Minimum of 1-2 x/day, 45 to 90 minutes OT Frequency: 5 out of 7 days OT Duration/Estimated Length of Stay: 7-10 days OT Treatment/Interventions: Balance/vestibular  training;Community reintegration;Discharge planning;Self Care/advanced ADL retraining;Therapeutic Exercise;Wheelchair propulsion/positioning;UE/LE Strength taining/ROM;DME/adaptive equipment instruction;Pain management;Patient/family education;UE/LE Coordination activities;Functional mobility training;Psychosocial support;Therapeutic Activities OT Self Feeding Anticipated Outcome(s): n/a OT Basic Self-Care Anticipated Outcome(s): mod I  OT Toileting Anticipated Outcome(s): mod I  OT Bathroom Transfers Anticipated Outcome(s): mod I - toilet OT Recommendation Patient destination: Home Follow Up Recommendations: Outpatient OT Equipment Recommended: To be determined   Skilled Therapeutic Intervention  Session 1:Upon entering the room, pt supine in bed with 5/10 c/o pain. OT educated pt on OT purpose, POC, and goals with pt verbalizing understanding this session. Pt engaged in bathing with sit <>stand at sink from wheelchair. Pt needing mild lifting assistance and gradually progressing to steady assistance for sit <>stand. Pt needing assistance to wash buttocks once standing and to reach B LEs. OT educated pt on use of AE to increase I with LB self care. Pt familiar with some of items discussed. Pt returning to bed in order to wash peri area and don elastic waist pants with assistance. Pt verbalizes this is how she performs task at home. Pt only has access to half bath on lower level of home. Pt remained supine at end of session secondary to fatigue. Call bell and all needed items within reach upon exiting the room.   Session 2: Upon entering the room, pt supine in bed with 3/10 c/o pain in lower back but agreeable to OT intervention. Pt requesting assistance in washing her hair. Pt performed supine >sit with steady assist and use of bed rail. Stand pivot transfer with RW to wheelchair. Pt propelled wheelchair to sink and OT sat up materials needed for task. Pt washing hair with B UE's with increased time.  Pt requesting to return to bed at end of this session.Pt transferred back to bed in same manner. Pt doffed lumbar corset and performed sit >supine with assistance for B LE's back into bed. Pt with call bell and all needed items within reach upon exiting the room.   OT Evaluation Precautions/Restrictions  Precautions Precautions: Back Precaution Comments: pt able to recall 3/3 precautions Required Braces or Orthoses: Spinal Brace Spinal Brace: Lumbar corset;Applied in sitting position Restrictions Weight Bearing Restrictions: No Vital Signs Therapy Vitals Temp: 98.4 F (36.9 C) Temp Source: Oral Pulse Rate: 85 Resp: 18 BP: (!) 132/52 Patient Position (if appropriate): Lying Oxygen Therapy SpO2: 91 % O2 Device: Not Delivered Pain Pain Assessment Pain Assessment: 0-10 Pain Score: 5  Pain Type: Surgical pain Pain Location: Back Pain Orientation: Lower Pain Descriptors / Indicators: Aching Pain Frequency: Intermittent Pain Onset: On-going Patients Stated Pain Goal: 2 Pain Intervention(s): Repositioned Home Living/Prior Functioning Home Living Family/patient expects to be discharged to:: Private residence Living Arrangements: Spouse/significant other Available Help at Discharge: Family, Available 24 hours/day Type of Home: House Home Access: Stairs to enter Technical brewer of Steps: 1 Entrance Stairs-Rails: None Home Layout: Multi-level, 1/2 bath on main level, Able to live on main level with bedroom/bathroom Alternate Level Stairs-Number of Steps: 2 stairs to get to kitchen - i rail. 14 to get to second floor bedroom bilateral rails. Flight to baselemt which she does not go to. Alternate Level Stairs-Rails: Right, Left Bathroom Shower/Tub: Multimedia programmer: Standard Bathroom Accessibility: Yes Additional Comments: pt has shoes with prafo  x2 pairs. pt requires (A) for white pair but PTA could don black pair independently  Lives With: Spouse Prior  Function Level of Independence: Requires assistive device for independence Driving: No Vocation: On disability Vision/Perception  Vision- History Baseline Vision/History: Wears glasses Wears Glasses: Reading only Patient Visual Report: No change from baseline Vision- Assessment Vision Assessment?: No apparent visual deficits  Cognition Overall Cognitive Status: Within Functional Limits for tasks assessed Arousal/Alertness: Awake/alert Orientation Level: Person;Place;Situation Person: Oriented Place: Oriented Situation: Oriented Year: 2017 Month: September Day of Week: Correct Memory: Appears intact Immediate Memory Recall: Sock;Blue;Bed Memory Recall: Sock;Blue;Bed Memory Recall Sock: Without Cue Memory Recall Blue: Without Cue Memory Recall Bed: Without Cue Sensation Sensation Light Touch: Appears Intact Stereognosis: Not tested Hot/Cold: Not tested Proprioception: Appears Intact Motor  Motor Motor: Within Functional Limits Mobility  Bed Mobility Bed Mobility: Supine to Sit;Sit to Supine Rolling Right: 4: Min guard Supine to Sit: 4: Min assist Supine to Sit Details: Verbal cues for precautions/safety Sit to Supine: 4: Min assist Sit to Supine - Details: Verbal cues for precautions/safety Transfers Sit to Stand: 4: Min assist Sit to Stand Details: Verbal cues for precautions/safety;Visual cues/gestures for sequencing  Trunk/Postural Assessment  Cervical Assessment Cervical Assessment: Within Functional Limits Thoracic Assessment Thoracic Assessment: Within Functional Limits Lumbar Assessment Lumbar Assessment:  (lumbar corset and back precautions) Postural Control Postural Control: Within Functional Limits  Balance Balance Balance Assessed: Yes Dynamic Standing Balance Dynamic Standing - Balance Support: Bilateral upper extremity supported;During functional activity Dynamic Standing - Level of Assistance: 4: Min assist Extremity/Trunk Assessment RUE  Assessment RUE Assessment: Within Functional Limits LUE Assessment LUE Assessment: Within Functional Limits   See Function Navigator for Current Functional Status.   Refer to Care Plan for Long Term Goals  Recommendations for other services: None  Discharge Criteria: Patient will be discharged from OT if patient refuses treatment 3 consecutive times without medical reason, if treatment goals not met, if there is a change in medical status, if patient makes no progress towards goals or if patient is discharged from hospital.  The above assessment, treatment plan, treatment alternatives and goals were discussed and mutually agreed upon: by patient  Phineas Semen 07/08/2016, 8:19 AM

## 2016-07-08 NOTE — Progress Notes (Signed)
Patient ID: Karen Reynolds, female   DOB: 12/28/1950, 65 y.o.   MRN: [id number] 65 y.o.femalewith history of T2DM, gout, lumbar decompression with fusion L4-S1, multiple mononeuropathies, LBP with radiculopathy and progressive foot drop due to 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 She elected to undergo L2/3 and L3/4 decompressive lam on 09/11/17by Dr. Ellene Route. Post op with ABLA with drop in H/H to 7.2 as well as  hypotension.  She was started on cipro 9/14 due to complaints of dysuria  Subjective/Complaints:   Objective: Vital Signs: Blood pressure (!) 132/52, pulse 85, temperature 98.4 F (36.9 C), temperature source Oral, resp. rate 18, height 5' 5" (1.651 m), weight 102.6 kg (226 lb 3.1 oz), SpO2 91 %. No results found. Results for orders placed or performed during the hospital encounter of 07/07/16 (from the past 72 hour(s))  Glucose, capillary     Status: Abnormal   Collection Time: 07/07/16  5:24 PM  Result Value Ref Range   Glucose-Capillary 130 (H) 65 - 99 mg/dL  CBC WITH DIFFERENTIAL     Status: Abnormal   Collection Time: 07/08/16  4:26 AM  Result Value Ref Range   WBC 7.4 4.0 - 10.5 K/uL   RBC 2.62 (L) 3.87 - 5.11 MIL/uL   Hemoglobin 7.0 (L) 12.0 - 15.0 g/dL   HCT 23.1 (L) 36.0 - 46.0 %   MCV 88.2 78.0 - 100.0 fL   MCH 26.7 26.0 - 34.0 pg   MCHC 30.3 30.0 - 36.0 g/dL   RDW 15.8 (H) 11.5 - 15.5 %   Platelets 275 150 - 400 K/uL   Neutrophils Relative % 68 %   Neutro Abs 5.0 1.7 - 7.7 K/uL   Lymphocytes Relative 22 %   Lymphs Abs 1.7 0.7 - 4.0 K/uL   Monocytes Relative 7 %   Monocytes Absolute 0.5 0.1 - 1.0 K/uL   Eosinophils Relative 3 %   Eosinophils Absolute 0.2 0.0 - 0.7 K/uL   Basophils Relative 0 %   Basophils Absolute 0.0 0.0 - 0.1 K/uL  Comprehensive metabolic panel     Status: Abnormal   Collection Time: 07/08/16  4:26 AM  Result Value Ref Range   Sodium 139 135 - 145 mmol/L   Potassium 3.5 3.5 - 5.1 mmol/L   Chloride 102 101 - 111 mmol/L   CO2 27 22 -  32 mmol/L   Glucose, Bld 137 (H) 65 - 99 mg/dL   BUN 12 6 - 20 mg/dL   Creatinine, Ser 1.31 (H) 0.44 - 1.00 mg/dL   Calcium 8.1 (L) 8.9 - 10.3 mg/dL   Total Protein 5.7 (L) 6.5 - 8.1 g/dL   Albumin 2.7 (L) 3.5 - 5.0 g/dL   AST 25 15 - 41 U/L   ALT 14 14 - 54 U/L   Alkaline Phosphatase 85 38 - 126 U/L   Total Bilirubin 0.5 0.3 - 1.2 mg/dL   GFR calc non Af Amer 41 (L) >60 mL/min   GFR calc Af Amer 48 (L) >60 mL/min    Comment: (NOTE) The eGFR has been calculated using the CKD EPI equation. This calculation has not been validated in all clinical situations. eGFR's persistently <60 mL/min signify possible Chronic Kidney Disease.    Anion gap 10 5 - 15  Glucose, capillary     Status: Abnormal   Collection Time: 07/08/16  6:40 AM  Result Value Ref Range   Glucose-Capillary 129 (H) 65 - 99 mg/dL     HEENT: normal Cardio: RRR Resp: CTA  B/L and unlabored GI: CTA B/L and unlabored Extremity:  No Edema Skin:   Other lumbar incision small amt dried blood on honeycomb dressing Neuro: Alert/Oriented, Abnormal Sensory reduced bilateral feet and Abnormal Motor o/5 bilateral ankle DF, 3- Left HF, KE, 4- R HF, KE Musc/Skel:  Other reduced lumbar ROM Gen NAD   Assessment/Plan: 1. Functional deficits secondary to Lumbar stenosis with multilevel radiculopathies which require 3+ hours per day of interdisciplinary therapy in a comprehensive inpatient rehab setting. Physiatrist is providing close team supervision and 24 hour management of active medical problems listed below. Physiatrist and rehab team continue to assess barriers to discharge/monitor patient progress toward functional and medical goals. FIM:                   Function - Comprehension Comprehension: Auditory Comprehension assist level: Follows complex conversation/direction with no assist  Function - Expression Expression: Verbal Expression assist level: Expresses complex ideas: With no assist  Function - Social  Interaction Social Interaction assist level: Interacts appropriately with others with medication or extra time (anti-anxiety, antidepressant).  Function - Problem Solving Problem solving assist level: Solves complex problems: Recognizes & self-corrects  Function - Memory Memory assist level: Complete Independence: No helper Patient normally able to recall (first 3 days only): Current season, Location of own room, Staff names and faces, That he or she is in a hospital    Medical Problem List and Plan: 1.  Functional and mobility deficits secondary to lumbar spondylosis with stenosis and radiculopathies              -PT, OT evals today             -assess for most appropriate AFO's (has a set of custom carbon AFO's and an of the shelf posterior leaf set which she prefers) Foot drop of 17moduration guarded prognosis for recovery 2.  DVT Prophylaxis/Anticoagulation: Mechanical: Sequential compression devices, below knee Bilateral lower extremities 3. Pain Management: continue oxycodone prn 4. Mood: LCSW to follow for evaluation and sup 5. Neuropsych: This patient is capable of making decisions on his own behalf. 6. Skin/Wound Care: Monitor wound for healing.               -dry daily dressing to superior, draining aspect of incision 7. Fluids/Electrolytes/Nutrition: Monitor I/O. Offer supplements 8. HTN: Monitor BP bid and titrate medications as indicated. Continue Norvasc, HCTZ and metoprolol with parameters. Vitals:   07/07/16 2000 07/08/16 0557  BP: (!) 137/50 (!) 132/52  Pulse: 82 85  Resp:  18  Temp:  98.4 F (36.9 C)   9. T2DM: Monitor BS ac/hs. Continue metformin bid.  CBG (last 3)   Recent Labs  07/07/16 1120 07/07/16 1724 07/08/16 0640  GLUCAP 151* 130* 129*    10. Dysuria: On cipro D# 2. Encourage fluid intake. 11. ABLA: Monitor for symptoms with increase in activity. Transfuse if symptomatic. Recheck CBC in am. 7.o on 9/16, stable but low monitor ortho vitals  12.  H/o Gout: On allopurinol. Monitor for flare up. 13. Diarrhea: Likely due to laxatives--will d/c senna and colace.  14. Dyspepsia: With N/V--will add pepcid to help manage symptoms.    LOS (Days) 1 A FACE TO FACE EVALUATION WAS PERFORMED  Paden Senger E 07/08/2016, 7:54 AM

## 2016-07-08 NOTE — Evaluation (Signed)
Physical Therapy Assessment and Plan  Patient Details  Name: Karen Reynolds MRN: [id number] Date of Birth: Jul 16, 1951  PT Diagnosis: Abnormality of gait, muscle weakness, lack of coordination  Rehab Potential: Good ELOS: 7-10days   Today's Date: 07/08/2016 PT Individual Time: 1000-1100 PT Individual Time Calculation (min): 60 min     Problem List:  Patient Active Problem List   Diagnosis Date Noted  . Lumbar radiculopathy 07/07/2016  . Essential hypertension 07/07/2016  . Lumbar stenosis 07/03/2016  . Peripheral neuropathy ([place]) 03/09/2016  . Spondylolisthesis of lumbar region 11/23/2015    Past Medical History:  Past Medical History:  Diagnosis Date  . Arthritis    degenerative joint disease, lumbar region degeneration    . Bruises easily   . Cancer ([place]) 1989   cervical ca  . Cataract    immautre but not sure which eye  . Chronic back pain    stenosis  . Complication of anesthesia   . Diabetes mellitus without complication ([place])    takes Metformin daily  . Foot drop, bilateral   . GERD (gastroesophageal reflux disease)    takes Omeprazole daily  . Gout    takes Allopurinol daily  . High cholesterol    takes Atorvastatin daily  . Hypertension    takes Amlodipine,HCTZ, and Metoprolol daily  . Peripheral neuropathy ([place]) 03/09/2016  . Peripheral neuropathy ([place])   . PONV (postoperative nausea and vomiting)   . Spinal headache    Past Surgical History:  Past Surgical History:  Procedure Laterality Date  . ABDOMINAL HYSTERECTOMY    . BACK SURGERY  1980   lumbar- L5-S1- laminectomy   . CESAREAN SECTION  1988  . CHOLECYSTECTOMY    . COLONOSCOPY    . COLONOSCOPY WITH ESOPHAGOGASTRODUODENOSCOPY (EGD)    . JOINT REPLACEMENT  3 &03/2015   bilateral knee  . left arm surgery     plates and screws  . STAPEDES SURGERY Bilateral   . TOTAL ELBOW REPLACEMENT Left     Assessment & Plan Clinical Impression: A65 y.o.femalewith history of T2DM, gout, lumbar  decompression with fusion L4-S1, multiple mononeuropathies, LBP with radiculopathy and progressive foot drop due to 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 She elected to undergo L2/3 and L3/4 decompressive lam on 09/11/17by Dr. Ellene Route. Post op with ABLA and hypotension. PT/OT evaluations done today showing deficits in ability to complete ADL tasks as well as weakness with inability to stand. CIR recommended for follow up therapy. Has been limited since Oct 2016 and was able to ambulate short distanceswith RW in home--used wheelchair out of home setting. Did some cooking using the rollater. She was was independent with ADLs (sponge bathes due to inability to get to upstairs bathroom).Patient states that she has bilateral AFOs for foot drop that came on in October 2016   Patient transferred to [facility] on 07/07/2016 .   Patient currently requires min with mobility secondary to muscle weakness.  Prior to hospitalization, patient was independent  with mobility and lived with Spouse in a House home.  Home access is 1Stairs to enter.  Patient will benefit from skilled PT intervention to maximize safe functional mobility for planned discharge home with 24 hour assist.  Anticipate patient will benefit from [facility] follow up  at discharge.  PT - End of Session Activity Tolerance: Tolerates 30+ min activity with multiple rests Endurance Deficit: Yes PT Assessment Rehab Potential (ACUTE/IP ONLY): Good Barriers to Discharge: Inaccessible home environment PT Patient demonstrates impairments in the following area(s): Balance;Endurance;Motor;Pain;Safety  PT Transfers Functional Problem(s): Car;Bed Mobility;Bed to Chair PT Locomotion Functional Problem(s): Ambulation;Stairs PT Plan PT Intensity: Minimum of 1-2 x/day ,45 to 90 minutes PT Frequency: 5 out of 7 days PT Duration Estimated Length of Stay: 7-10days PT Treatment/Interventions: Ambulation/gait training;Neuromuscular re-education;Stair training;UE/LE Strength  taining/ROM;Therapeutic Activities;Wheelchair propulsion/positioning;Discharge planning;Functional mobility training;Patient/family education;Therapeutic Exercise PT Transfers Anticipated Outcome(s): ModI PT Locomotion Anticipated Outcome(s): Mod I  PT Recommendation Follow Up Recommendations: Home health PT Patient destination: Home Equipment Recommended: To be determined  Skilled Therapeutic Intervention  Patient in room , sitting in w/c , agrees to therapy session. Patient complains of on going pain in back, burning sensation at surgical site.  W/C mobility: 3 x 50 feet with B UE , discontinues propulsion due to fatigue, able to properly use breaks and is familiar with use of w/c in general.  Gait Training: 1 x 30 feet with RW with B AFOs, step to pattern, foot drop on both sides, decreased velocity, not able to correct deficits with cues due to decreased strength.  Stair negotiation x 1 step up going forward and descending backwards, patient very anxious about navigating stairs, B rails and mod A.  Car transfer with min A, use of RW and cues for proper sequencing to assure that back precautions are maintained.   Multiple sit to stand transfers with min A, sit <=>supine with min to mod A to navigate LE on /off bed.  At the end of session patient requested to return to bed. All needs within reach and Rn notified that patient is requesting pain medicine.  Initiated education on therapy processes, condition and established goals, patient in agreement.     PT Evaluation Precautions/Restrictions Precautions Precautions: Back Precaution Booklet Issued: No Precaution Comments: pt able to recall 3/3 precautions Required Braces or Orthoses: Spinal Brace Spinal Brace: Lumbar corset;Applied in sitting position Restrictions Weight Bearing Restrictions: No General Chart Reviewed: Yes Family/Caregiver Present: No Vital SignsTherapy Vitals Pulse Rate: 79 BP: (!) 142/63 Pain Pain  Assessment Pain Assessment: 0-10 Pain Score: 6  Pain Type: Acute pain;Surgical pain Pain Location: Back Pain Orientation: Right;Left Pain Descriptors / Indicators: Burning Pain Frequency: Constant Pain Onset: On-going Patients Stated Pain Goal: 0 Pain Intervention(s): RN made aware Multiple Pain Sites: No Home Living/Prior Functioning Home Living Available Help at Discharge: Family;Available 24 hours/day Type of Home: House Home Access: Stairs to enter [employer] of Steps: 1 Entrance Stairs-Rails: None Home Layout: Multi-level;1/2 bath on main level;Able to live on main level with bedroom/bathroom Alternate Level Stairs-Number of Steps: 2 stairs to get to kitchen - i rail. 14 to get to second floor bedroom bilateral rails. Flight to baselemt which she does not go to. Alternate Level Stairs-Rails: Right;Left Bathroom Shower/Tub: Multimedia programmer: Standard Bathroom Accessibility: Yes Additional Comments: pt has shoes with prafo x2 pairs. pt requires (A) for white pair but PTA could don black pair independently  Lives With: Spouse Prior Function Level of Independence: Requires assistive device for independence Driving: No Vocation: On disability Vision/Perception     Cognition Overall Cognitive Status: Within Functional Limits for tasks assessed Orientation Level: Oriented X4 Sensation Sensation Light Touch: Appears Intact Stereognosis: Appears Intact Hot/Cold: Appears Intact Proprioception: Appears Intact Motor  Motor Motor: Within Functional Limits  Mobility Bed Mobility Bed Mobility: Supine to Sit;Sit to Supine Rolling Right: 4: Min guard Supine to Sit: 4: Min assist Supine to Sit Details: Verbal cues for precautions/safety Sit to Supine: 4: Min assist Sit to Supine - Details: Verbal cues for precautions/safety  Transfers Transfers: Yes Sit to Stand: 4: Min assist Sit to Stand Details: Verbal cues for precautions/safety;Visual  cues/gestures for sequencing Locomotion  Ambulation Ambulation: Yes Ambulation/Gait Assistance: 4: Min assist Ambulation Distance (Feet): 30 Feet Assistive device: Rolling walker Ambulation/Gait Assistance Details: Verbal cues for precautions/safety Gait Gait: Yes Gait Pattern: Impaired Gait Pattern: Step-to pattern;Left steppage;Right steppage;Poor foot clearance - right;Poor foot clearance - left Gait velocity: decreased Stairs / Additional Locomotion Stairs: Yes Stairs Assistance: 3: Mod assist Stairs Assistance Details (indicate cue type and reason):  (forward ascend and backward descend ) Stair Management Technique: Two rails Number of Stairs: 1 Height of Stairs: 6 Wheelchair Mobility Wheelchair Mobility: Yes Wheelchair Assistance: 4: Min Tour manager: Both upper extremities Distance: 50  Trunk/Postural Assessment  Cervical Assessment Cervical Assessment: Within Water engineer Thoracic Assessment: Within Functional Limits Lumbar Assessment Lumbar Assessment: Exceptions to [facility] Postural Control Postural Control: Within Functional Limits  Balance Balance Balance Assessed: Yes Extremity Assessment      RLE Assessment RLE Assessment: Exceptions to [facility] RLE Strength RLE Overall Strength: Deficits LLE Assessment LLE Assessment: Exceptions to [facility] LLE Strength LLE Overall Strength: Deficits   See Function Navigator for Current Functional Status.   Refer to Care Plan for Long Term Goals  Recommendations for other services: None  Discharge Criteria: Patient will be discharged from PT if patient refuses treatment 3 consecutive times without medical reason, if treatment goals not met, if there is a change in medical status, if patient makes no progress towards goals or if patient is discharged from hospital.  The above assessment, treatment plan, treatment alternatives and goals were discussed and mutually agreed upon: by  patient  Guadlupe Spanish 07/08/2016, 12:42 PM

## 2016-07-08 NOTE — Progress Notes (Signed)
Physical Therapy Session Note  Patient Details  Name: Karen Reynolds MRN: [id number] Date of Birth: [birth year]  Today's Date: 07/08/2016       Skilled Therapeutic Interventions/Progress Updates:  Session 1300-1345 (45 min )   Patient in bed at the beginning of session, agrees to therapy intervention, reports continued burning sensation at surgical site.   Coming to sit EOB with HOB flat with min guard, able to don LSO, but needs assistance with shoes.  Short distance ambulation x35 feet with Rw with min Guard, continues with steppage gait and short steps, increased lean forward and WB through UE.  NuStep x 10 min at resistance level of 4 in order to increase activity tolerance, cardiopulmonary  compliance and facilitate reciprocal movement.  Alternating step ups on 4 inch box 2 x 5 , increased rest break needed due to fatigue.  Patient returned to room , was able to ambulate with min guard to bathroom and get on and off commode with min guard, max A for hygiene.  At the end of session returned to bed , with all needs within reach.      See Function Navigator for Current Functional Status.   Therapy/Group: Individual Therapy  Guadlupe Spanish 07/08/2016, 3:35 PM

## 2016-07-09 ENCOUNTER — Inpatient Hospital Stay (HOSPITAL_COMMUNITY): Payer: PPO | Admitting: *Deleted

## 2016-07-09 DIAGNOSIS — G589 Mononeuropathy, unspecified: Secondary | ICD-10-CM | POA: Diagnosis not present

## 2016-07-09 DIAGNOSIS — M4806 Spinal stenosis, lumbar region: Secondary | ICD-10-CM | POA: Diagnosis not present

## 2016-07-09 DIAGNOSIS — I10 Essential (primary) hypertension: Secondary | ICD-10-CM | POA: Diagnosis not present

## 2016-07-09 DIAGNOSIS — M5416 Radiculopathy, lumbar region: Secondary | ICD-10-CM | POA: Diagnosis not present

## 2016-07-09 LAB — GLUCOSE, CAPILLARY
GLUCOSE-CAPILLARY: 176 mg/dL — AB (ref 65–99)
Glucose-Capillary: 148 mg/dL — ABNORMAL HIGH (ref 65–99)
Glucose-Capillary: 124 mg/dL — ABNORMAL HIGH (ref 65–99)
Glucose-Capillary: 146 mg/dL — ABNORMAL HIGH (ref 65–99)

## 2016-07-09 NOTE — Progress Notes (Signed)
Patient ID: Karen Reynolds, female   DOB: April 10, 1951, 65 y.o.   MRN: [id number]  Subjective/Complaints: No dizziness  Discussed keeping IV in until we are sure she does not need a transfusion ROS no CP, SOB, N/V/D Objective: Vital Signs: Blood pressure (!) 134/57, pulse 77, temperature 98.4 F (36.9 C), temperature source Oral, resp. rate 18, height 5' 5" (1.651 m), weight 102.6 kg (226 lb 3.1 oz), SpO2 98 %. No results found. Results for orders placed or performed during the hospital encounter of 07/07/16 (from the past 72 hour(s))  Glucose, capillary     Status: Abnormal   Collection Time: 07/07/16  5:24 PM  Result Value Ref Range   Glucose-Capillary 130 (H) 65 - 99 mg/dL  CBC WITH DIFFERENTIAL     Status: Abnormal   Collection Time: 07/08/16  4:26 AM  Result Value Ref Range   WBC 7.4 4.0 - 10.5 K/uL   RBC 2.62 (L) 3.87 - 5.11 MIL/uL   Hemoglobin 7.0 (L) 12.0 - 15.0 g/dL   HCT 23.1 (L) 36.0 - 46.0 %   MCV 88.2 78.0 - 100.0 fL   MCH 26.7 26.0 - 34.0 pg   MCHC 30.3 30.0 - 36.0 g/dL   RDW 15.8 (H) 11.5 - 15.5 %   Platelets 275 150 - 400 K/uL   Neutrophils Relative % 68 %   Neutro Abs 5.0 1.7 - 7.7 K/uL   Lymphocytes Relative 22 %   Lymphs Abs 1.7 0.7 - 4.0 K/uL   Monocytes Relative 7 %   Monocytes Absolute 0.5 0.1 - 1.0 K/uL   Eosinophils Relative 3 %   Eosinophils Absolute 0.2 0.0 - 0.7 K/uL   Basophils Relative 0 %   Basophils Absolute 0.0 0.0 - 0.1 K/uL  Comprehensive metabolic panel     Status: Abnormal   Collection Time: 07/08/16  4:26 AM  Result Value Ref Range   Sodium 139 135 - 145 mmol/L   Potassium 3.5 3.5 - 5.1 mmol/L   Chloride 102 101 - 111 mmol/L   CO2 27 22 - 32 mmol/L   Glucose, Bld 137 (H) 65 - 99 mg/dL   BUN 12 6 - 20 mg/dL   Creatinine, Ser 1.31 (H) 0.44 - 1.00 mg/dL   Calcium 8.1 (L) 8.9 - 10.3 mg/dL   Total Protein 5.7 (L) 6.5 - 8.1 g/dL   Albumin 2.7 (L) 3.5 - 5.0 g/dL   AST 25 15 - 41 U/L   ALT 14 14 - 54 U/L   Alkaline Phosphatase 85 38 - 126  U/L   Total Bilirubin 0.5 0.3 - 1.2 mg/dL   GFR calc non Af Amer 41 (L) >60 mL/min   GFR calc Af Amer 48 (L) >60 mL/min    Comment: (NOTE) The eGFR has been calculated using the CKD EPI equation. This calculation has not been validated in all clinical situations. eGFR's persistently <60 mL/min signify possible Chronic Kidney Disease.    Anion gap 10 5 - 15  Glucose, capillary     Status: Abnormal   Collection Time: 07/08/16  6:40 AM  Result Value Ref Range   Glucose-Capillary 129 (H) 65 - 99 mg/dL  Glucose, capillary     Status: Abnormal   Collection Time: 07/08/16 11:54 AM  Result Value Ref Range   Glucose-Capillary 102 (H) 65 - 99 mg/dL  Glucose, capillary     Status: Abnormal   Collection Time: 07/08/16  4:25 PM  Result Value Ref Range   Glucose-Capillary 154 (H) 65 -  99 mg/dL  Glucose, capillary     Status: Abnormal   Collection Time: 07/08/16  9:17 PM  Result Value Ref Range   Glucose-Capillary 132 (H) 65 - 99 mg/dL   Comment 1 Notify RN   Glucose, capillary     Status: Abnormal   Collection Time: 07/09/16  6:36 AM  Result Value Ref Range   Glucose-Capillary 148 (H) 65 - 99 mg/dL   Comment 1 Notify RN      HEENT: normal Cardio: RRR Resp: CTA B/L and unlabored GI: CTA B/L and unlabored Extremity:  No Edema Skin:   Other lumbar incision small amt dried blood on honeycomb dressing Neuro: Alert/Oriented, Abnormal Sensory reduced bilateral feet and Abnormal Motor o/5 bilateral ankle DF, 3- Left HF, KE, 4- R HF, KE Musc/Skel:  Other reduced lumbar ROM Gen NAD   Assessment/Plan: 1. Functional deficits secondary to Lumbar stenosis with multilevel radiculopathies which require 3+ hours per day of interdisciplinary therapy in a comprehensive inpatient rehab setting. Physiatrist is providing close team supervision and 24 hour management of active medical problems listed below. Physiatrist and rehab team continue to assess barriers to discharge/monitor patient progress  toward functional and medical goals. FIM: Function - Bathing Position: Wheelchair/chair at sink Body parts bathed by patient: Right arm, Left arm, Chest, Abdomen, Right upper leg, Left upper leg, Front perineal area Body parts bathed by helper: Buttocks, Right lower leg, Left lower leg, Back Assist Level: Touching or steadying assistance(Pt > 75%)  Function- Upper Body Dressing/Undressing What is the patient wearing?: Pull over shirt/dress, Orthosis Pull over shirt/dress - Perfomed by patient: Put head through opening, Thread/unthread right sleeve, Thread/unthread left sleeve, Pull shirt over trunk Orthosis activity level: Performed by helper Assist Level: Set up, Supervision or verbal cues Set up : To apply TLSO, cervical collar, To obtain clothing/put away Function - Lower Body Dressing/Undressing What is the patient wearing?: Pants, Socks, Shoes Position: Sitting EOB Pants- Performed by patient: Pull pants up/down Pants- Performed by helper: Thread/unthread right pants leg, Thread/unthread left pants leg Socks - Performed by helper: Don/doff right sock, Don/doff left sock Shoes - Performed by helper: Don/doff right shoe, Don/doff left shoe Assist for footwear: Dependant Assist for lower body dressing: Touching or steadying assistance (Pt > 75%)  Function - Toileting Toileting steps completed by patient: Performs perineal hygiene Toileting steps completed by helper: Adjust clothing prior to toileting, Adjust clothing after toileting Assist level: Touching or steadying assistance (Pt.75%)  Function - Toilet Transfers Toilet transfer assistive device: Elevated toilet seat/BSC over toilet, Walker Assist level to toilet: Touching or steadying assistance (Pt > 75%) Assist level from toilet: Touching or steadying assistance (Pt > 75%)  Function - Chair/bed transfer Chair/bed transfer method: Ambulatory Chair/bed transfer assist level: Touching or steadying assistance (Pt >  75%) Chair/bed transfer assistive device: Walker  Function - Locomotion: Wheelchair Will patient use wheelchair at discharge?: Yes Type: Manual Max wheelchair distance: 50 Assist Level: Supervision or verbal cues Wheel 150 feet activity did not occur: Safety/medical concerns Turns around,maneuvers to table,bed, and toilet,negotiates 3% grade,maneuvers on rugs and over doorsills: Yes Function - Locomotion: Ambulation Assistive device: Walker-rolling Max distance: 30 Assist level: Touching or steadying assistance (Pt > 75%) Assist level: Touching or steadying assistance (Pt > 75%) Walk 50 feet with 2 turns activity did not occur: Safety/medical concerns Walk 150 feet activity did not occur: Safety/medical concerns Walk 10 feet on uneven surfaces activity did not occur: Safety/medical concerns  Function - Comprehension Comprehension: Auditory Comprehension assist  level: Follows complex conversation/direction with no assist  Function - Expression Expression: Verbal Expression assist level: Expresses complex ideas: With no assist  Function - Social Interaction Social Interaction assist level: Interacts appropriately with others - No medications needed.  Function - Problem Solving Problem solving assist level: Solves complex problems: Recognizes & self-corrects  Function - Memory Memory assist level: Complete Independence: No helper Patient normally able to recall (first 3 days only): Current season    Medical Problem List and Plan: 1.  Functional and mobility deficits secondary to lumbar spondylosis with stenosis and radiculopathies              -PT, OT evals today             -assess for most appropriate AFO's (has a set of custom carbon AFO's and an of the shelf posterior leaf set which she prefers) Foot drop of 32moduration guarded prognosis for recovery 2.  DVT Prophylaxis/Anticoagulation: Mechanical: Sequential compression devices, below knee Bilateral lower extremities 3.  Pain Management: continue oxycodone prn 4. Mood: LCSW to follow for evaluation and sup 5. Neuropsych: This patient is capable of making decisions on his own behalf. 6. Skin/Wound Care: Monitor wound for healing.               -dry daily dressing to superior, draining aspect of incision 7. Fluids/Electrolytes/Nutrition: Monitor I/O. Offer supplements 8. HTN: Monitor BP bid and titrate medications as indicated. Continue Norvasc, HCTZ and metoprolol with parameters. Vitals:   07/08/16 2042 07/09/16 0521  BP: 126/64 (!) 134/57  Pulse: 78 77  Resp:  18  Temp:  98.4 F (36.9 C)   9. T2DM: Monitor BS ac/hs. Continue metformin bid.  CBG (last 3)   Recent Labs  07/08/16 1625 07/08/16 2117 07/09/16 0636  GLUCAP 154* 132* 148*    10. Dysuria: On cipro D# 2. Encourage fluid intake. 11. ABLA: Monitor for symptoms with increase in activity. Transfuse if symptomatic. Recheck CBC in am. 7.o on 9/16, stable but low monitor ortho vitals  12. H/o Gout: On allopurinol. Monitor for flare up. 13. Diarrhea: Likely due to laxatives--will d/c senna and colace.  14. Dyspepsia: With N/V--will add pepcid to help manage symptoms.    LOS (Days) 2 A FACE TO FACE EVALUATION WAS PERFORMED  KIRSTEINS,ANDREW E 07/09/2016, 7:40 AM

## 2016-07-09 NOTE — Progress Notes (Signed)
Physical Therapy Session Note  Patient Details  Name: Karen Reynolds MRN: [id number] Date of Birth: 10/24/1950  Today's Date: 07/09/2016 PT Individual Time: 1445-1530 PT Individual Time Calculation (min): 45 min    Skilled Therapeutic Interventions/Progress Updates: Patient in bed, agrees to therapy session, min guard to perform supine to sit transfer with proper technique to maintain back precautions, patient able to don LSO w/o assistance but needs max A for donning shoes. Patient assisted to bathroom, able to walk in and out with RW, needs max A for hygiene. Sit to stand from raised bed with min guard, transfer to w/c with min guard.  Patient performed w/c propulsion to therapy gym with supervision.  NuStep x 10 min with resistance @ level 4.  Gait Training with Rw 3 x 30 feet with min A, short steps, very unsure.  Stair negotiation training 1 step up and down (backwards)  At end of session patient returned to bed and left with all needs within reach.   Therapy Documentation Precautions:  Precautions Precautions: Back Precaution Booklet Issued: No Precaution Comments: pt able to recall 3/3 precautions Required Braces or Orthoses: Spinal Brace Spinal Brace: Lumbar corset, Applied in sitting position Restrictions Weight Bearing Restrictions: No   Therapy/Group: Individual Therapy  Guadlupe Spanish 07/09/2016, 3:54 PM

## 2016-07-10 ENCOUNTER — Inpatient Hospital Stay (HOSPITAL_COMMUNITY): Payer: PPO | Admitting: Occupational Therapy

## 2016-07-10 ENCOUNTER — Inpatient Hospital Stay (HOSPITAL_COMMUNITY): Payer: PPO | Admitting: Physical Therapy

## 2016-07-10 DIAGNOSIS — D62 Acute posthemorrhagic anemia: Secondary | ICD-10-CM

## 2016-07-10 DIAGNOSIS — M5416 Radiculopathy, lumbar region: Secondary | ICD-10-CM | POA: Diagnosis not present

## 2016-07-10 DIAGNOSIS — I10 Essential (primary) hypertension: Secondary | ICD-10-CM | POA: Diagnosis not present

## 2016-07-10 DIAGNOSIS — M4316 Spondylolisthesis, lumbar region: Secondary | ICD-10-CM | POA: Diagnosis not present

## 2016-07-10 DIAGNOSIS — M4806 Spinal stenosis, lumbar region: Secondary | ICD-10-CM | POA: Diagnosis not present

## 2016-07-10 LAB — CBC
HCT: 22.3 % — ABNORMAL LOW (ref 36.0–46.0)
HEMOGLOBIN: 6.9 g/dL — AB (ref 12.0–15.0)
MCH: 27 pg (ref 26.0–34.0)
MCHC: 30.9 g/dL (ref 30.0–36.0)
MCV: 87.1 fL (ref 78.0–100.0)
Platelets: 302 10*3/uL (ref 150–400)
RBC: 2.56 MIL/uL — AB (ref 3.87–5.11)
RDW: 16 % — ABNORMAL HIGH (ref 11.5–15.5)
WBC: 6.5 10*3/uL (ref 4.0–10.5)

## 2016-07-10 LAB — PREPARE RBC (CROSSMATCH)

## 2016-07-10 LAB — GLUCOSE, CAPILLARY
GLUCOSE-CAPILLARY: 132 mg/dL — AB (ref 65–99)
GLUCOSE-CAPILLARY: 105 mg/dL — AB (ref 65–99)
GLUCOSE-CAPILLARY: 121 mg/dL — AB (ref 65–99)
GLUCOSE-CAPILLARY: 105 mg/dL — AB (ref 65–99)

## 2016-07-10 MED ORDER — FUROSEMIDE 10 MG/ML IJ SOLN
20.0000 mg | Freq: Once | INTRAMUSCULAR | Status: AC
  Administered 2016-07-10: 20 mg via INTRAVENOUS
  Filled 2016-07-10: qty 2

## 2016-07-10 MED ORDER — SODIUM CHLORIDE 0.9 % IV SOLN
Freq: Once | INTRAVENOUS | Status: AC
  Administered 2016-07-10: 15:00:00 via INTRAVENOUS

## 2016-07-10 MED ORDER — ACETAMINOPHEN 325 MG PO TABS
650.0000 mg | ORAL_TABLET | Freq: Once | ORAL | Status: AC
  Administered 2016-07-10: 650 mg via ORAL
  Filled 2016-07-10: qty 2

## 2016-07-10 MED ORDER — DIPHENHYDRAMINE HCL 25 MG PO CAPS
25.0000 mg | ORAL_CAPSULE | Freq: Once | ORAL | Status: AC
  Administered 2016-07-10: 25 mg via ORAL
  Filled 2016-07-10: qty 1

## 2016-07-10 NOTE — Care Management Note (Signed)
[facility] Individual Statement of Services  Patient Name:  Karen Reynolds  Date:  07/10/2016  Welcome to the [facility].  Our goal is to provide you with an individualized program based on your diagnosis and situation, designed to meet your specific needs.  With this comprehensive rehabilitation program, you will be expected to participate in at least 3 hours of rehabilitation therapies Monday-Friday, with modified therapy programming on the weekends.  Your rehabilitation program will include the following services:  Physical Therapy (PT), Occupational Therapy (OT), 24 hour per day rehabilitation nursing, Case Management (Social Worker), Rehabilitation Medicine, Nutrition Services and Pharmacy Services  Weekly team conferences will be held on Tuesdays to discuss your progress.  Your Social Worker will talk with you frequently to get your input and to update you on team discussions.  Team conferences with you and your family in attendance may also be held.  Expected length of stay: 7-10 days  Overall anticipated outcome: modified independent  Depending on your progress and recovery, your program may change. Your Social Worker will coordinate services and will keep you informed of any changes. Your Social Worker's name and contact numbers are listed  below.  The following services may also be recommended but are not provided by the [facility] will be made to provide these services after discharge if needed.  Arrangements include referral to agencies that provide these services.  Your insurance has been verified to be:  [employer] Your primary doctor is:  Dr. Janace Litten  Pertinent information will be shared with your doctor and your insurance company.  Social Worker:  [place], [place] or ([phone]   Information discussed with and copy given to patient by: Lennart Pall, 07/10/2016, 10:27 AM

## 2016-07-10 NOTE — Progress Notes (Signed)
Orthopedic Tech Progress Note Patient Details:  Karen Reynolds 11-25-1950 [id number]  Patient ID: Karen Reynolds, female   DOB: [birth year], 65 y.o.   MRN: [id number]   Karen Reynolds 07/10/2016, 11:37 AMCalled [facility] for  Lumbar brace and PRAFO brace.

## 2016-07-10 NOTE — Progress Notes (Signed)
Occupational Therapy Session Note  Patient Details  Name: Karen Reynolds MRN: [id number] Date of Birth: 04-18-1951  Today's Date: 07/10/2016 OT Individual Time: [id number] OT Individual Time Calculation (min): 59 min     Short Term Goals: Week 1:  OT Short Term Goal 1 (Week 1): STGs=LTGs secondary to ELOS  Skilled Therapeutic Interventions/Progress Updates:    Upon entering the room, RN exiting and pt seated on elevated toilet. Pt reports 10/10 pain in lower back and states medications given prior to OT arrival. Pt still agreeable to participate in OT intervention and requesting to take shower this session. Pt required assistance with hygiene after BM and OT discussed use of toilet aid in the future. Pt ambulated with RW and steady assist to transfer onto shower seat. Pt becomes very tearful and reports feeling overwelmed. OT educating pt on OT POC, LTG, and purpose of OT intervention. OT introduced and demonstrated long handled sponge in order to increase I with LB bathing. OT covering dressing with shower guard and pt engaged in bathing from shower level with min A. Pt donning pull over shirt and lumbar corset while sitting on shower chair and ambulating back to bed for LB dressing. Pt returning to supine and requiring assistance to thread pants onto feet but rolls with supervision L <>R and pulls over B hips. Pt remained in bed at end of session secondary to pain and fatigue. Call bell and all needed items within reach upon exiting the room.   Therapy Documentation Precautions:  Precautions Precautions: Back Precaution Booklet Issued: No Precaution Comments: pt able to recall 3/3 precautions Required Braces or Orthoses: Spinal Brace Spinal Brace: Lumbar corset, Applied in sitting position Restrictions Weight Bearing Restrictions: No General:   Vital Signs: Therapy Vitals Pulse Rate: 87 BP: (!) 148/69 Pain: Pain Assessment Pain Assessment: 0-10 Pain Score: 5  Pain Type: Acute  pain;Surgical pain Pain Location: Back Pain Orientation: Mid;Lower Pain Descriptors / Indicators: Burning Pain Frequency: Intermittent Pain Onset: With Activity Pain Intervention(s): Medication (See eMAR)  See Function Navigator for Current Functional Status.   Therapy/Group: Individual Therapy  Phineas Semen 07/10/2016, 10:28 AM

## 2016-07-10 NOTE — Progress Notes (Signed)
Patient ID: Karen Reynolds, female   DOB: 09/28/1951, 65 y.o.   MRN: [id number]  Subjective/Complaints: Feels tired and fatigued. Able to sleep last night. Pain under reasonable control.  Discussed keeping IV in until we are sure she does not need a transfusion ROS no CP, SOB, N/V/D Objective: Vital Signs: Blood pressure (!) 148/69, pulse 87, temperature 98 F (36.7 C), temperature source Oral, resp. rate 16, height 5' 5" (1.651 m), weight 102.6 kg (226 lb 3.1 oz), SpO2 99 %. No results found. Results for orders placed or performed during the hospital encounter of 07/07/16 (from the past 72 hour(s))  Glucose, capillary     Status: Abnormal   Collection Time: 07/07/16  5:24 PM  Result Value Ref Range   Glucose-Capillary 130 (H) 65 - 99 mg/dL  CBC WITH DIFFERENTIAL     Status: Abnormal   Collection Time: 07/08/16  4:26 AM  Result Value Ref Range   WBC 7.4 4.0 - 10.5 K/uL   RBC 2.62 (L) 3.87 - 5.11 MIL/uL   Hemoglobin 7.0 (L) 12.0 - 15.0 g/dL   HCT 23.1 (L) 36.0 - 46.0 %   MCV 88.2 78.0 - 100.0 fL   MCH 26.7 26.0 - 34.0 pg   MCHC 30.3 30.0 - 36.0 g/dL   RDW 15.8 (H) 11.5 - 15.5 %   Platelets 275 150 - 400 K/uL   Neutrophils Relative % 68 %   Neutro Abs 5.0 1.7 - 7.7 K/uL   Lymphocytes Relative 22 %   Lymphs Abs 1.7 0.7 - 4.0 K/uL   Monocytes Relative 7 %   Monocytes Absolute 0.5 0.1 - 1.0 K/uL   Eosinophils Relative 3 %   Eosinophils Absolute 0.2 0.0 - 0.7 K/uL   Basophils Relative 0 %   Basophils Absolute 0.0 0.0 - 0.1 K/uL  Comprehensive metabolic panel     Status: Abnormal   Collection Time: 07/08/16  4:26 AM  Result Value Ref Range   Sodium 139 135 - 145 mmol/L   Potassium 3.5 3.5 - 5.1 mmol/L   Chloride 102 101 - 111 mmol/L   CO2 27 22 - 32 mmol/L   Glucose, Bld 137 (H) 65 - 99 mg/dL   BUN 12 6 - 20 mg/dL   Creatinine, Ser 1.31 (H) 0.44 - 1.00 mg/dL   Calcium 8.1 (L) 8.9 - 10.3 mg/dL   Total Protein 5.7 (L) 6.5 - 8.1 g/dL   Albumin 2.7 (L) 3.5 - 5.0 g/dL   AST 25 15  - 41 U/L   ALT 14 14 - 54 U/L   Alkaline Phosphatase 85 38 - 126 U/L   Total Bilirubin 0.5 0.3 - 1.2 mg/dL   GFR calc non Af Amer 41 (L) >60 mL/min   GFR calc Af Amer 48 (L) >60 mL/min    Comment: (NOTE) The eGFR has been calculated using the CKD EPI equation. This calculation has not been validated in all clinical situations. eGFR's persistently <60 mL/min signify possible Chronic Kidney Disease.    Anion gap 10 5 - 15  Glucose, capillary     Status: Abnormal   Collection Time: 07/08/16  6:40 AM  Result Value Ref Range   Glucose-Capillary 129 (H) 65 - 99 mg/dL  Glucose, capillary     Status: Abnormal   Collection Time: 07/08/16 11:54 AM  Result Value Ref Range   Glucose-Capillary 102 (H) 65 - 99 mg/dL  Glucose, capillary     Status: Abnormal   Collection Time: 07/08/16  4:25 PM  Result Value Ref Range   Glucose-Capillary 154 (H) 65 - 99 mg/dL  Glucose, capillary     Status: Abnormal   Collection Time: 07/08/16  9:17 PM  Result Value Ref Range   Glucose-Capillary 132 (H) 65 - 99 mg/dL   Comment 1 Notify RN   Glucose, capillary     Status: Abnormal   Collection Time: 07/09/16  6:36 AM  Result Value Ref Range   Glucose-Capillary 148 (H) 65 - 99 mg/dL   Comment 1 Notify RN   Glucose, capillary     Status: Abnormal   Collection Time: 07/09/16 12:12 PM  Result Value Ref Range   Glucose-Capillary 124 (H) 65 - 99 mg/dL  Glucose, capillary     Status: Abnormal   Collection Time: 07/09/16  5:43 PM  Result Value Ref Range   Glucose-Capillary 176 (H) 65 - 99 mg/dL  Glucose, capillary     Status: Abnormal   Collection Time: 07/09/16  9:09 PM  Result Value Ref Range   Glucose-Capillary 146 (H) 65 - 99 mg/dL   Comment 1 Notify RN   CBC     Status: Abnormal   Collection Time: 07/10/16  4:43 AM  Result Value Ref Range   WBC 6.5 4.0 - 10.5 K/uL   RBC 2.56 (L) 3.87 - 5.11 MIL/uL   Hemoglobin 6.9 (LL) 12.0 - 15.0 g/dL    Comment: REPEATED TO VERIFY CRITICAL RESULT CALLED TO, READ  BACK BY AND VERIFIED WITH: D.DONNIER,RN [id number] 07/10/16 M.CAMPBELL    HCT 22.3 (L) 36.0 - 46.0 %   MCV 87.1 78.0 - 100.0 fL   MCH 27.0 26.0 - 34.0 pg   MCHC 30.9 30.0 - 36.0 g/dL   RDW 16.0 (H) 11.5 - 15.5 %   Platelets 302 150 - 400 K/uL  Glucose, capillary     Status: Abnormal   Collection Time: 07/10/16  6:36 AM  Result Value Ref Range   Glucose-Capillary 132 (H) 65 - 99 mg/dL   Comment 1 Notify RN      HEENT: normal Cardio: RRR Resp: CTA B/L and unlabored GI: CTA B/L and unlabored Extremity:  No Edema Skin:   Other lumbar incision small amt dried blood on honeycomb dressing--stable---no further drainage noted Neuro: Alert/Oriented, Abnormal Sensory reduced bilateral feet and Abnormal Motor o/5 bilateral ankle DF, 3- Left HF, KE, 4- R HF, KE Musc/Skel:  Other reduced lumbar ROM. Heel cords are tight Gen NAD   Assessment/Plan: 1. Functional deficits secondary to Lumbar stenosis with multilevel radiculopathies which require 3+ hours per day of interdisciplinary therapy in a comprehensive inpatient rehab setting. Physiatrist is providing close team supervision and 24 hour management of active medical problems listed below. Physiatrist and rehab team continue to assess barriers to discharge/monitor patient progress toward functional and medical goals. FIM: Function - Bathing Position: Wheelchair/chair at sink Body parts bathed by patient: Right arm, Left arm, Chest, Abdomen, Right upper leg, Left upper leg, Front perineal area Body parts bathed by helper: Buttocks, Right lower leg, Left lower leg, Back Assist Level: Touching or steadying assistance(Pt > 75%)  Function- Upper Body Dressing/Undressing What is the patient wearing?: Pull over shirt/dress, Orthosis Pull over shirt/dress - Perfomed by patient: Put head through opening, Thread/unthread right sleeve, Thread/unthread left sleeve, Pull shirt over trunk Orthosis activity level: Performed by helper Assist Level: Set up,  Supervision or verbal cues Set up : To apply TLSO, cervical collar, To obtain clothing/put away Function - Lower Body Dressing/Undressing What is the patient wearing?:  Pants, Socks, Shoes Position: Sitting EOB Pants- Performed by patient: Pull pants up/down Pants- Performed by helper: Thread/unthread right pants leg, Thread/unthread left pants leg Socks - Performed by helper: Don/doff right sock, Don/doff left sock Shoes - Performed by helper: Don/doff right shoe, Don/doff left shoe Assist for footwear: Dependant Assist for lower body dressing: Touching or steadying assistance (Pt > 75%)  Function - Toileting Toileting steps completed by patient: Adjust clothing prior to toileting Toileting steps completed by helper: Adjust clothing prior to toileting, Performs perineal hygiene, Adjust clothing after toileting Toileting Assistive Devices: Grab bar or rail Assist level: Touching or steadying assistance (Pt.75%)  Function - Air cabin crew transfer assistive device: Elevated toilet seat/BSC over toilet Assist level to toilet: Touching or steadying assistance (Pt > 75%) Assist level from toilet: Touching or steadying assistance (Pt > 75%)  Function - Chair/bed transfer Chair/bed transfer method: Ambulatory Chair/bed transfer assist level: Touching or steadying assistance (Pt > 75%) Chair/bed transfer assistive device: Walker  Function - Locomotion: Wheelchair Will patient use wheelchair at discharge?: Yes Type: Manual Max wheelchair distance: 50 Assist Level: Supervision or verbal cues Wheel 150 feet activity did not occur: Safety/medical concerns Turns around,maneuvers to table,bed, and toilet,negotiates 3% grade,maneuvers on rugs and over doorsills: Yes Function - Locomotion: Ambulation Assistive device: Walker-rolling Max distance: 30 Assist level: Touching or steadying assistance (Pt > 75%) Assist level: Touching or steadying assistance (Pt > 75%) Walk 50 feet with 2  turns activity did not occur: Safety/medical concerns Walk 150 feet activity did not occur: Safety/medical concerns Walk 10 feet on uneven surfaces activity did not occur: Safety/medical concerns  Function - Comprehension Comprehension: Auditory Comprehension assist level: Follows complex conversation/direction with no assist  Function - Expression Expression: Verbal Expression assist level: Expresses complex ideas: With no assist  Function - Social Interaction Social Interaction assist level: Interacts appropriately with others - No medications needed.  Function - Problem Solving Problem solving assist level: Solves complex problems: Recognizes & self-corrects  Function - Memory Memory assist level: Complete Independence: No helper Patient normally able to recall (first 3 days only): Current season    Medical Problem List and Plan: 1.  Functional and mobility deficits secondary to lumbar spondylosis with stenosis and radiculopathies              -PT, OT continue             -pt prefers plastic PL AFO's  -will as [employer] to place tension AFO to stretch heel cords 2.  DVT Prophylaxis/Anticoagulation: Mechanical: Sequential compression devices, below knee Bilateral lower extremities 3. Pain Management: continue oxycodone prn 4. Mood: LCSW to follow for evaluation and sup 5. Neuropsych: This patient is capable of making decisions on his own behalf. 6. Skin/Wound Care: Monitor wound for healing.               -superior portion of wound now dry  7. Fluids/Electrolytes/Nutrition: Monitor I/O. Offer supplements 8. HTN: Monitor BP bid and titrate medications as indicated. Continue Norvasc, HCTZ and metoprolol with parameters. Vitals:   07/10/16 0503 07/10/16 0753  BP: (!) 153/57 (!) 148/69  Pulse: 87 87  Resp: 16   Temp: 98 F (36.7 C)    9. T2DM: Monitor BS ac/hs. Continue metformin bid.   -fair control CBG (last 3)   Recent Labs  07/09/16 1743 07/09/16 2109 07/10/16 0636   GLUCAP 176* 146* 132*    10. Dysuria: On cipro D# 5 today---no ua or cx on record  -dc  after today's doses 11. ABLA:     -hgb down to 6.9---pt symptomatic  -transfuse 2u PRBC today 12. H/o Gout: On allopurinol. Monitor for flare up. 13. Diarrhea: improved after stopping laxatives.  14. Dyspepsia:  pepcid to help manage symptoms.    LOS (Days) 3 A FACE TO FACE EVALUATION WAS PERFORMED  SWARTZ,ZACHARY T 07/10/2016, 8:54 AM

## 2016-07-10 NOTE — IPOC Note (Signed)
Overall Plan of Care [facility]) Patient Details Name: Karen Reynolds MRN: [id number] DOB: Mar 28, 1951  Admitting Diagnosis: Lumbar Fusion  Hospital Problems: Principal Problem:   Lumbar radiculopathy Active Problems:   Spondylolisthesis of lumbar region   Peripheral neuropathy (HCC)   Lumbar stenosis   Essential hypertension     Functional Problem List: Nursing Bowel, Endurance, Motor, Pain, Safety, Skin Integrity  PT Balance, Endurance, Motor, Pain, Safety  OT Balance, Endurance, Motor, Pain, Safety  SLP    TR         Basic ADL's: OT Grooming, Bathing, Dressing, Toileting     Advanced  ADL's: OT Simple Meal Preparation, Laundry     Transfers: PT Car, Bed Mobility, Bed to Chair  OT Toilet     Locomotion: PT Ambulation, Stairs     Additional Impairments: OT None  SLP        TR      Anticipated Outcomes Item Anticipated Outcome  Self Feeding n/a  Swallowing      Basic self-care  mod I   Toileting  mod I    Bathroom Transfers mod I - toilet  Bowel/Bladder  Continent to bowel and bladder with min. assist.  Transfers  ModI  Locomotion  Mod I   Communication     Cognition     Pain  Less than 3,on 1 to 10 scale.  Safety/Judgment  Free from falls during her stay in rehab.   Therapy Plan: PT Intensity: Minimum of 1-2 x/day ,45 to 90 minutes PT Frequency: 5 out of 7 days PT Duration Estimated Length of Stay: 7-10days OT Intensity: Minimum of 1-2 x/day, 45 to 90 minutes OT Frequency: 5 out of 7 days OT Duration/Estimated Length of Stay: 7-10 days         Team Interventions: Nursing Interventions Patient/Family Education, Bowel Management, Disease Management/Prevention, Skin Care/Wound Management, Pain Management, Discharge Planning  PT interventions Ambulation/gait training, Neuromuscular re-education, Stair training, UE/LE Strength taining/ROM, Therapeutic Activities, Wheelchair propulsion/positioning, Discharge planning, Functional mobility training,  Patient/family education, Therapeutic Exercise  OT Interventions Balance/vestibular training, Community reintegration, Discharge planning, Self Care/advanced ADL retraining, Therapeutic Exercise, Wheelchair propulsion/positioning, UE/LE Strength taining/ROM, DME/adaptive equipment instruction, Pain management, Patient/family education, UE/LE Coordination activities, Functional mobility training, Psychosocial support, Therapeutic Activities  SLP Interventions    TR Interventions    SW/CM Interventions Discharge Planning, Psychosocial Support, Patient/Family Education    Team Discharge Planning: Destination: PT-Home ,OT- Home , SLP-  Projected Follow-up: PT-Home health PT, OT-  Outpatient OT, SLP-  Projected Equipment Needs: PT-To be determined, OT- To be determined, SLP-  Equipment Details: PT- , OT-  Patient/family involved in discharge planning: PT- Patient,  OT-Patient, SLP-   MD ELOS: 7-10 days Medical Rehab Prognosis:  Excellent Assessment: The patient has been admitted for CIR therapies with the diagnosis of lumbar stenosis with radicullopathies. The team will be addressing functional mobility, strength, stamina, balance, safety, adaptive techniques and equipment, self-care, bowel and bladder mgt, patient and caregiver education, back precautions, orthotics, pain control, ego support, community reintegration. Goals have been set at mod I for mobility and self-care tasks.    Meredith Staggers, MD, FAAPMR      See Team Conference Notes for weekly updates to the plan of care

## 2016-07-10 NOTE — Progress Notes (Signed)
Physical Therapy Session Note  Patient Details  Name: Karen Reynolds MRN: [id number] Date of Birth: 1951/02/15  Today's Date: 07/10/2016 PT Individual Time: 1300-1425 PT Individual Time Calculation (min): 85 min    Short Term Goals: Week 1:  PT Short Term Goal 1 (Week 1): STG=LTG  Skilled Therapeutic Interventions/Progress Updates:   Pt received seated in w/c, c/o back pain 7/10 and agreeable to treatment. BLE shoes/AFOs donned totalA. Gait 2x15' with RW in room with min guard/S to/from bathroom. Required assist to pull up pants and perform hygiene; doffs pants without assist. Seated at sink pt performs hand hygiene modI. W/c propulsion x75' with BUE for strengthening and endurance. Ascent/descent one 6" stair with min guard and BUE on rails; fatigues quickly and requests rest break. Stand pivot transfer w/c <>mat table with Rw and close S. Sit <>stand 3x5 reps with table elevated; cueing for exhale during exertion to improve ease. Gait 2x22' with RW and min guard. W/c propulsion x150' BUE to return to room for strengthening. Stand pivot transfer with RW to elevated bed to simulate bed at home; unable to get up into high bed height and requires bed to be lowered to prevent sliding off EOB. Pt reports she can sleep in day bed at home which is lower to the ground. [place] sit >supine for BLE management. Remained supine in bed at end of sessio, PRAFOs donned, all needs in reach.   Therapy Documentation Precautions:  Precautions Precautions: Back Precaution Booklet Issued: No Precaution Comments: pt able to recall 3/3 precautions Required Braces or Orthoses: Spinal Brace Spinal Brace: Lumbar corset, Applied in sitting position Restrictions Weight Bearing Restrictions: No   See Function Navigator for Current Functional Status.   Therapy/Group: Individual Therapy  Luberta Mutter 07/10/2016, 2:30 PM

## 2016-07-10 NOTE — Progress Notes (Signed)
Blood volume 335 mL completed at 1851. No reactions, patient resting comfortably.

## 2016-07-10 NOTE — Progress Notes (Signed)
At around 5:30 am Lab called and said patient Hgb has dropped to 6.9, on call was notified but no new order was place.

## 2016-07-10 NOTE — Progress Notes (Signed)
Pre blood temp was 99.1. PA notified. Ordered to proceed, give additional tylenol as needed.
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
## 2016-07-10 NOTE — Plan of Care (Signed)
Problem: RH Stairs Goal: LTG Patient will ambulate up and down stairs w/assist (PT) LTG: Patient will ambulate up and down # of stairs with assistance (PT)  downgraded; 13 stairs not required for basic home access

## 2016-07-10 NOTE — Progress Notes (Signed)
Physical Therapy Session Note  Patient Details  Name: Karen Reynolds MRN: [id number] Date of Birth: 1951/05/21  Today's Date: 07/10/2016 PT Individual Time: 1002-1101 PT Individual Time Calculation (min): 59 min    Short Term Goals: Week 1:  PT Short Term Goal 1 (Week 1): STG=LTG  Skilled Therapeutic Interventions/Progress Updates:    Pt received sitting on EOB & agreeable to treatment; pt noted 5/10 back pain but reports being premedicated. Session focused on pt education regarding plastic AFO's versus metal AFO's, benefits of each and gait quality. Gait training x 20 ft + 20 ft + 20 ft with both pair of AFO's; with plastic AFO's pt demonstrates significant RLE hip & knee flexion and absent heel strike. Educated pt on more normalized gait pattern with metal AFO's & pt agreeable to trying them. Pt propelled w/c x 60 ft with BUE & supervision in controlled hallway for cardiopulmonary endurance training and BUE strengthening. In gym pt negotiated 1 step (6") with B rails and min assist with cuing for compensatory technique. Pt propelled w/c an additional 60 ft + 20 ft back to room in same method as noted above. Pt completed stand pivot w/c>bed with RW & min assist and required mod assist for BLE and max cuing for log rolling technique to return sit>supine. At end of session pt left in bed with all needs within reach.   Pt able to recall 2/3 back precautions on this date; therapist educated pt on 3/3 precautions.   Therapy Documentation Precautions:  Precautions Precautions: Back Precaution Booklet Issued: No Precaution Comments: pt able to recall 3/3 precautions Required Braces or Orthoses: Spinal Brace Spinal Brace: Lumbar corset, Applied in sitting position Restrictions Weight Bearing Restrictions: No   See Function Navigator for Current Functional Status.   Therapy/Group: Individual Therapy  Waunita Schooner 07/10/2016, 12:31 PM

## 2016-07-11 ENCOUNTER — Inpatient Hospital Stay (HOSPITAL_COMMUNITY): Payer: PPO | Admitting: Physical Therapy

## 2016-07-11 ENCOUNTER — Inpatient Hospital Stay (HOSPITAL_COMMUNITY): Payer: PPO | Admitting: Occupational Therapy

## 2016-07-11 DIAGNOSIS — M5416 Radiculopathy, lumbar region: Secondary | ICD-10-CM | POA: Diagnosis not present

## 2016-07-11 LAB — TYPE AND SCREEN
ABO/RH(D): O POS
Antibody Screen: NEGATIVE
UNIT DIVISION: 0
Unit division: 0

## 2016-07-11 LAB — GLUCOSE, CAPILLARY
GLUCOSE-CAPILLARY: 117 mg/dL — AB (ref 65–99)
GLUCOSE-CAPILLARY: 137 mg/dL — AB (ref 65–99)
GLUCOSE-CAPILLARY: 118 mg/dL — AB (ref 65–99)
Glucose-Capillary: 115 mg/dL — ABNORMAL HIGH (ref 65–99)

## 2016-07-11 NOTE — Progress Notes (Signed)
Physical Therapy Session Note  Patient Details  Name: Karen Reynolds MRN: [id number] Date of Birth: 08/30/1951  Today's Date: 07/11/2016 PT Individual Time: 0800-0900 PT Individual Time Calculation (min): 60 min    Short Term Goals: Week 1:  PT Short Term Goal 1 (Week 1): STG=LTG  Skilled Therapeutic Interventions/Progress Updates:   Pt received seated on EOB, c/o pain as below and agreeable to treatment. Gait in/out of bathroom with min guard and RW 2x15'. Requires maxA for toileting to pull up/down pants and perform peri hygiene. Changed pt's shoes/AFOs to PLS as no support provided by plastic AFOs. Gait x30' with RW and min guard. Sit <>stand from edge of mat table with RW and min guard, table elevated. Once in standing position, able to perform 1-2 reps mini-squats at a time before seated rest break. Educated pt on body mechanics for sit >stand to reduce reliance on UEs and increase BLE activation. Gait 2x20 with video analysis to demonstrate to pt gait abnormalities including forward flexed trunk, hip ER; improved with cueing and visual feedback of gait pattern. Returned to room in w/c totalA. Stand pivot to return to bed with minA and no AD. Sit >supine modA for BLE management. Remained supine in bed at end of session, all needs in reach.   Therapy Documentation Precautions:  Precautions Precautions: Back Precaution Booklet Issued: No Precaution Comments: pt able to recall 3/3 precautions Required Braces or Orthoses: Spinal Brace Spinal Brace: Lumbar corset, Applied in sitting position Restrictions Weight Bearing Restrictions: No Pain: Pain Assessment Pain Assessment: 0-10 Pain Score: 8  Pain Type: Surgical pain Pain Location: Back Pain Orientation: Mid;Lower Pain Descriptors / Indicators: Aching Pain Frequency: Intermittent Pain Onset: On-going Patients Stated Pain Goal: 2 Pain Intervention(s): Medication (See eMAR) Multiple Pain Sites: No   See Function Navigator for  Current Functional Status.   Therapy/Group: Individual Therapy  Luberta Mutter 07/11/2016, 9:01 AM

## 2016-07-11 NOTE — Evaluation (Signed)
Recreational Therapy Assessment and Plan  Patient Details  Name: Karen Reynolds MRN: [id number] Date of Birth: 12-10-1950 Today's Date: 07/11/2016  Rehab Potential: Good ELOS: 2 weeks   Assessment  Problem List: Patient Active Problem List   Diagnosis Date Noted  . Lumbar radiculopathy 07/07/2016  . Essential hypertension 07/07/2016  . Lumbar stenosis 07/03/2016  . Peripheral neuropathy ([place]) 03/09/2016  . Spondylolisthesis of lumbar region 11/23/2015    Past Medical History:      Past Medical History:  Diagnosis Date  . Arthritis    degenerative joint disease, lumbar region degeneration    . Bruises easily   . Cancer ([place]) 1989   cervical ca  . Cataract    immautre but not sure which eye  . Chronic back pain    stenosis  . Complication of anesthesia   . Diabetes mellitus without complication ([place])    takes Metformin daily  . Foot drop, bilateral   . GERD (gastroesophageal reflux disease)    takes Omeprazole daily  . Gout    takes Allopurinol daily  . High cholesterol    takes Atorvastatin daily  . Hypertension    takes Amlodipine,HCTZ, and Metoprolol daily  . Peripheral neuropathy ([place]) 03/09/2016  . Peripheral neuropathy ([place])   . PONV (postoperative nausea and vomiting)   . Spinal headache    Past Surgical History:       Past Surgical History:  Procedure Laterality Date  . ABDOMINAL HYSTERECTOMY    . BACK SURGERY  1980   lumbar- L5-S1- laminectomy   . CESAREAN SECTION  1988  . CHOLECYSTECTOMY    . COLONOSCOPY    . COLONOSCOPY WITH ESOPHAGOGASTRODUODENOSCOPY ([facility])    . JOINT REPLACEMENT  3 &03/2015   bilateral knee  . left arm surgery     plates and screws  . STAPEDES SURGERY Bilateral   . TOTAL ELBOW REPLACEMENT Left     Assessment & Plan Clinical Impression: Patient is a 65 y.o. year old female with history of T2DM, gout, lumbar decompression with fusion L4-S1, multiple mononeuropathies, LBP with  radiculopathy and progressive foot drop due to 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 She elected to undergo L2/3 and L3/4 decompressive lam on 09/11/17by Dr. Ellene Route. Post op with ABLA with drop in H/H to 7.2 as well as hypotension. She was started on cipro 9/14 due to complaints of dysuria. Therapy ongoing and patient limited by pain in hips as well as weakness. CIR recommended for follow up therapy   Patient transferred to CIR on 07/07/2016 .    Pt presents with decreased activity tolerance, decreased functional mobility, decreased balance Limiting pt's independence with leisure/community pursuits.  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 Appropriate for Education?: Yes Recreational Therapy Orientation Orientation -Reviewed with patient: Available activity resources Strengths/Weaknesses Patient Strengths/Abilities: Willingness to participate Patient weaknesses: Physical limitations;Minimal Premorbid Leisure Activity TR Patient demonstrates impairments in the following area(s): Endurance;Motor;Pain;Safety;Skin Integrity  Plan Rec Therapy Plan Is patient appropriate for Therapeutic Recreation?: Yes Rehab Potential: Good Treatment times per week: Min 1 time per week >20 minutes Estimated Length of Stay: 2 weeks TR Treatment/Interventions: Adaptive equipment instruction;1:1 session;Balance/vestibular training;Functional mobility training;Community reintegration;Patient/family education;Therapeutic activities;Recreation/leisure  participation;Therapeutic exercise;UE/LE Coordination activities;Wheelchair propulsion/positioning Recommendations for other services: Neuropsych  Recommendations for other services: Neuropsych  Discharge Criteria: Patient will be discharged from TR if patient refuses treatment 3 consecutive times without medical reason.  If treatment goals not met, if there is a change in medical status, if patient makes no progress towards goals or if patient is discharged from hospital.  The above assessment, treatment plan, treatment alternatives and goals were discussed and mutually agreed upon: by patient  [place] 07/11/2016, 3:53 PM

## 2016-07-11 NOTE — Progress Notes (Signed)
Patient ID: Karen Reynolds, female   DOB: Jul 16, 1951, 65 y.o.   MRN: [id number]  Subjective/Complaints: Received blood without issues. Still feels fatigued this am. Wearing carbon AFO's  Discussed keeping IV in until we are sure she does not need a transfusion ROS no CP, SOB, N/V/D Objective: Vital Signs: Blood pressure 121/60, pulse 78, temperature 98.5 F (36.9 C), temperature source Oral, resp. rate 18, height 5\' 5"  (1.651 m), weight 102.6 kg (226 lb 3.1 oz), SpO2 95 %. No results found. Results for orders placed or performed during the hospital encounter of 07/07/16 (from the past 72 hour(s))  Glucose, capillary     Status: Abnormal   Collection Time: 07/08/16 11:54 AM  Result Value Ref Range   Glucose-Capillary 102 (H) 65 - 99 mg/dL  Glucose, capillary     Status: Abnormal   Collection Time: 07/08/16  4:25 PM  Result Value Ref Range   Glucose-Capillary 154 (H) 65 - 99 mg/dL  Glucose, capillary     Status: Abnormal   Collection Time: 07/08/16  9:17 PM  Result Value Ref Range   Glucose-Capillary 132 (H) 65 - 99 mg/dL   Comment 1 Notify RN   Glucose, capillary     Status: Abnormal   Collection Time: 07/09/16  6:36 AM  Result Value Ref Range   Glucose-Capillary 148 (H) 65 - 99 mg/dL   Comment 1 Notify RN   Glucose, capillary     Status: Abnormal   Collection Time: 07/09/16 12:12 PM  Result Value Ref Range   Glucose-Capillary 124 (H) 65 - 99 mg/dL  Glucose, capillary     Status: Abnormal   Collection Time: 07/09/16  5:43 PM  Result Value Ref Range   Glucose-Capillary 176 (H) 65 - 99 mg/dL  Glucose, capillary     Status: Abnormal   Collection Time: 07/09/16  9:09 PM  Result Value Ref Range   Glucose-Capillary 146 (H) 65 - 99 mg/dL   Comment 1 Notify RN   CBC     Status: Abnormal   Collection Time: 07/10/16  4:43 AM  Result Value Ref Range   WBC 6.5 4.0 - 10.5 K/uL   RBC 2.56 (L) 3.87 - 5.11 MIL/uL   Hemoglobin 6.9 (LL) 12.0 - 15.0 g/dL    Comment: REPEATED TO  VERIFY CRITICAL RESULT CALLED TO, READ BACK BY AND VERIFIED WITH: D.DONNIER,RN [id number] 07/10/16 M.CAMPBELL    HCT 22.3 (L) 36.0 - 46.0 %   MCV 87.1 78.0 - 100.0 fL   MCH 27.0 26.0 - 34.0 pg   MCHC 30.9 30.0 - 36.0 g/dL   RDW 16.0 (H) 11.5 - 15.5 %   Platelets 302 150 - 400 K/uL  Glucose, capillary     Status: Abnormal   Collection Time: 07/10/16  6:36 AM  Result Value Ref Range   Glucose-Capillary 132 (H) 65 - 99 mg/dL   Comment 1 Notify RN   Prepare RBC     Status: None   Collection Time: 07/10/16  9:00 AM  Result Value Ref Range   Order Confirmation ORDER PROCESSED BY BLOOD BANK   Glucose, capillary     Status: Abnormal   Collection Time: 07/10/16 11:38 AM  Result Value Ref Range   Glucose-Capillary 105 (H) 65 - 99 mg/dL  Glucose, capillary     Status: Abnormal   Collection Time: 07/10/16  4:58 PM  Result Value Ref Range   Glucose-Capillary 121 (H) 65 - 99 mg/dL  Glucose, capillary     Status: Abnormal  Collection Time: 07/10/16  9:04 PM  Result Value Ref Range   Glucose-Capillary 105 (H) 65 - 99 mg/dL  Glucose, capillary     Status: Abnormal   Collection Time: 07/11/16  6:45 AM  Result Value Ref Range   Glucose-Capillary 117 (H) 65 - 99 mg/dL     HEENT: normal Cardio: RRR Resp: CTA B/L and unlabored GI: CTA B/L and unlabored Extremity:  No Edema Skin:   Other lumbar incision small amt dried blood on honeycomb dressing--stable---no further drainage noted Neuro: Alert/Oriented, Abnormal Sensory reduced bilateral feet and Abnormal Motor o/5 bilateral ankle DF, 3- Left HF, KE, 4- R HF, KE Musc/Skel:  Heel cords are tight Gen NAD   Assessment/Plan: 1. Functional deficits secondary to Lumbar stenosis with multilevel radiculopathies which require 3+ hours per day of interdisciplinary therapy in a comprehensive inpatient rehab setting. Physiatrist is providing close team supervision and 24 hour management of active medical problems listed below. Physiatrist and rehab team  continue to assess barriers to discharge/monitor patient progress toward functional and medical goals. FIM: Function - Bathing Position: Wheelchair/chair at sink Body parts bathed by patient: Right arm, Left arm, Chest, Abdomen, Right upper leg, Left upper leg, Front perineal area, Buttocks, Right lower leg, Left lower leg Body parts bathed by helper: Buttocks, Right lower leg, Left lower leg, Back Bathing not applicable: Back Assist Level: Touching or steadying assistance(Pt > 75%)  Function- Upper Body Dressing/Undressing What is the patient wearing?: Pull over shirt/dress, Orthosis Pull over shirt/dress - Perfomed by patient: Put head through opening, Thread/unthread right sleeve, Thread/unthread left sleeve, Pull shirt over trunk Orthosis activity level: Performed by helper Assist Level: Set up, Supervision or verbal cues Set up : To apply TLSO, cervical collar, To obtain clothing/put away Function - Lower Body Dressing/Undressing What is the patient wearing?: Pants, Socks, Shoes, AFO Position: Sitting EOB Pants- Performed by patient: Pull pants up/down Pants- Performed by helper: Thread/unthread right pants leg, Thread/unthread left pants leg Socks - Performed by helper: Don/doff right sock, Don/doff left sock Shoes - Performed by helper: Don/doff right shoe, Don/doff left shoe AFO - Performed by helper: Don/doff right AFO, Don/doff left AFO Assist for footwear: Dependant Assist for lower body dressing:  (max A)  Function - Toileting Toileting steps completed by patient: Adjust clothing prior to toileting, Performs perineal hygiene, Adjust clothing after toileting Toileting steps completed by helper: Adjust clothing after toileting, Performs perineal hygiene Toileting Assistive Devices: Grab bar or rail Assist level: Touching or steadying assistance (Pt.75%)  Function - Air cabin crew transfer assistive device: Elevated toilet seat/BSC over toilet, Walker Assist level  to toilet: Supervision or verbal cues Assist level from toilet: Supervision or verbal cues  Function - Chair/bed transfer Chair/bed transfer method: Stand pivot Chair/bed transfer assist level: Supervision or verbal cues Chair/bed transfer assistive device: Armrests, Walker Chair/bed transfer details: Verbal cues for precautions/safety  Function - Locomotion: Wheelchair Will patient use wheelchair at discharge?: Yes Type: Manual Max wheelchair distance: 60 ft Assist Level: Supervision or verbal cues Assist Level: Supervision or verbal cues Wheel 150 feet activity did not occur: Safety/medical concerns Turns around,maneuvers to table,bed, and toilet,negotiates 3% grade,maneuvers on rugs and over doorsills: Yes Function - Locomotion: Ambulation Assistive device: Walker-rolling, Orthosis, Other (comment) Max distance: 22 Assist level: Touching or steadying assistance (Pt > 75%) Assist level: Touching or steadying assistance (Pt > 75%) Walk 50 feet with 2 turns activity did not occur: Safety/medical concerns Walk 150 feet activity did not occur: Safety/medical concerns Walk  10 feet on uneven surfaces activity did not occur: Safety/medical concerns  Function - Comprehension Comprehension: Auditory Comprehension assist level: Follows complex conversation/direction with no assist  Function - Expression Expression: Verbal Expression assist level: Expresses complex ideas: With no assist  Function - Social Interaction Social Interaction assist level: Interacts appropriately with others - No medications needed.  Function - Problem Solving Problem solving assist level: Solves complex problems: Recognizes & self-corrects  Function - Memory Memory assist level: Complete Independence: No helper Patient normally able to recall (first 3 days only): Current season    Medical Problem List and Plan: 1.  Functional and mobility deficits secondary to lumbar spondylosis with stenosis and  radiculopathies              -PT, OT continue             -discussed AFO use again today. Maladaptive gait patterns  -will review with PT in team conference today  -  tension AFO to stretch heel cords 2.  DVT Prophylaxis/Anticoagulation: Mechanical: Sequential compression devices, below knee Bilateral lower extremities 3. Pain Management: continue oxycodone prn 4. Mood: LCSW to follow for evaluation and sup 5. Neuropsych: This patient is capable of making decisions on his own behalf. 6. Skin/Wound Care: Monitor wound for healing.               -superior portion of wound now dry  -remove honeycomb dressing in next 1-2 days  7. Fluids/Electrolytes/Nutrition: Monitor I/O. Offer supplements 8. HTN: Monitor BP bid and titrate medications as indicated. Continue Norvasc, HCTZ and metoprolol with parameters. Vitals:   07/11/16 0007 07/11/16 0636  BP: 115/75 121/60  Pulse: 71 78  Resp: 18 18  Temp: 98.6 F (37 C) 98.5 F (36.9 C)   9. T2DM: Monitor BS ac/hs. Continue metformin bid.   -fair control CBG (last 3)   Recent Labs  07/10/16 1658 07/10/16 2104 07/11/16 0645  GLUCAP 121* 105* 117*    10. Dysuria:cipro completed 11. ABLA:     -receceived 2u PRBC's  -re-check cbc tomorrow 12. H/o Gout: On allopurinol. Monitor for flare up. 13. Diarrhea: improved after stopping laxatives.  14. Dyspepsia:  pepcid to help manage symptoms.    LOS (Days) 4 A FACE TO FACE EVALUATION WAS PERFORMED  Karen Reynolds T 07/11/2016, 8:42 AM

## 2016-07-11 NOTE — Progress Notes (Signed)
Occupational Therapy Session Note  Patient Details  Name: Karen Reynolds MRN: [id number] Date of Birth: 12/05/1950  Today's Date: 07/11/2016 OT Individual Time: 1030-1150 OT Individual Time Calculation (min): 80 min (missed 10 min as lunch arrived early and pt wanted to start eating)    Short Term Goals:Week 1:  OT Short Term Goal 1 (Week 1): STGs=LTGs secondary to ELOS  Skilled Therapeutic Interventions/Progress Updates:    Pt seen for skilled OT to facilitate functional mobility, standing balance and tolerance, use of AE with ADL retraining. Pt agreeable to shower this am. Sat to EOB to don TLSO and AFOs and then ambulated to toilet then to shower. Doffed pants/ shoes, then sat down to doff brace and shirt.  Back incision and Iv covered. Used long sponge in shower.  Donned ub clothing and brace in shower and then ambulated to bed to lay down to continue drying and donning powders.  Pt sat to EOB to use reacher to don pants over feet.  Max encouragement to stand and pull pants over hips as pt fearful of letting go with hand. Max encouragement to stand at sink to brush teeth as pt wanted to sit. Stood for 30 sec prior to stating she needed to sit.  Pt's lunch arrived early and pt wanted to start eating. Pt in room with all needs met.  Therapy Documentation Precautions:  Precautions Precautions: Back Precaution Booklet Issued: No Precaution Comments: pt able to recall 3/3 precautions Required Braces or Orthoses: Spinal Brace Spinal Brace: Lumbar corset, Applied in sitting position Restrictions Weight Bearing Restrictions: No  General OT Amount of Missed Time: 10 Minutes    Pain: Pain Assessment Pain Assessment: 0-10 Pain Score: 2  Pain Type: Surgical pain Pain Location: Back Pain Orientation: Mid;Lower Pain Descriptors / Indicators: Aching ADL:  See Function Navigator for Current Functional Status.   Therapy/Group: Individual Therapy  SAGUIER,JULIA 07/11/2016, 12:10 PM  

## 2016-07-11 NOTE — Progress Notes (Signed)
Physical Therapy Session Note  Patient Details  Name: Karen Reynolds MRN: [id number] Date of Birth: 1951/05/06  Today's Date: 07/11/2016 PT Individual Time: 1305-1401 PT Individual Time Calculation (min): 56 min    Short Term Goals: Week 1:  PT Short Term Goal 1 (Week 1): STG=LTG  Skilled Therapeutic Interventions/Progress Updates:    Pt received sitting on EOB & agreeable to PT, noting 5/10 back soreness but reports waiting until therapy is over to request pain medication. Therapist donned pt's BLE shoes & AFO's total assist for time management & pt independently donned LSO. Pt requested to use bathroom; ambulated 15 ft + 15 ft bed<>toilet with RW & steady assist. Transported pt down to rehab apartment and recreational therapist arrived for remainder of session. Pt prepared food in rehab kitchen from w/c level and ambulating with RW. Activities included functional tasks to simulate home environment (placing pan in stove, retrieving ingredients from overhead cabinet). Pt noted fatigue with tasks even from w/c level. Discussed PLOF with pt reporting she was ambulating with a cane until ~4 months ago but she sits on rollator to prepare meals at home with assistance from husband. Educated pt on anticipated PT goals, purpose of increasing endurance, BLE strength and functional activity tolerance. Gait training x 35 ft + 30 ft with min assist and seated rest breaks 2/2 fatigue. At end of session pt assisted to bed via mod assist for BLE and cuing to maintain back precautions. Pt left in bed with all needs within reach.   Therapy Documentation Precautions:  Precautions Precautions: Back Precaution Booklet Issued: No Precaution Comments: pt able to recall 3/3 precautions Required Braces or Orthoses: Spinal Brace Spinal Brace: Lumbar corset, Applied in sitting position Restrictions Weight Bearing Restrictions: No   See Function Navigator for Current Functional Status.   Therapy/Group: Individual  Therapy  Waunita Schooner 07/11/2016, 2:57 PM

## 2016-07-12 ENCOUNTER — Inpatient Hospital Stay (HOSPITAL_COMMUNITY): Payer: PPO | Admitting: Occupational Therapy

## 2016-07-12 ENCOUNTER — Inpatient Hospital Stay (HOSPITAL_COMMUNITY): Payer: PPO | Admitting: Physical Therapy

## 2016-07-12 ENCOUNTER — Inpatient Hospital Stay (HOSPITAL_COMMUNITY): Payer: PPO | Admitting: *Deleted

## 2016-07-12 DIAGNOSIS — M5416 Radiculopathy, lumbar region: Secondary | ICD-10-CM | POA: Diagnosis not present

## 2016-07-12 DIAGNOSIS — I10 Essential (primary) hypertension: Secondary | ICD-10-CM | POA: Diagnosis not present

## 2016-07-12 DIAGNOSIS — M4316 Spondylolisthesis, lumbar region: Secondary | ICD-10-CM | POA: Diagnosis not present

## 2016-07-12 DIAGNOSIS — M4806 Spinal stenosis, lumbar region: Secondary | ICD-10-CM | POA: Diagnosis not present

## 2016-07-12 LAB — GLUCOSE, CAPILLARY
GLUCOSE-CAPILLARY: 116 mg/dL — AB (ref 65–99)
GLUCOSE-CAPILLARY: 136 mg/dL — AB (ref 65–99)
GLUCOSE-CAPILLARY: 99 mg/dL (ref 65–99)
GLUCOSE-CAPILLARY: 110 mg/dL — AB (ref 65–99)

## 2016-07-12 LAB — CBC
HCT: 34.2 % — ABNORMAL LOW (ref 36.0–46.0)
Hemoglobin: 10.5 g/dL — ABNORMAL LOW (ref 12.0–15.0)
MCH: 27 pg (ref 26.0–34.0)
MCHC: 30.7 g/dL (ref 30.0–36.0)
MCV: 87.9 fL (ref 78.0–100.0)
PLATELETS: 434 10*3/uL — AB (ref 150–400)
RBC: 3.89 MIL/uL (ref 3.87–5.11)
RDW: 15.5 % (ref 11.5–15.5)
WBC: 8.8 10*3/uL (ref 4.0–10.5)

## 2016-07-12 NOTE — Progress Notes (Signed)
Occupational Therapy Session Note  Patient Details  Name: Karen Reynolds MRN: [id number] Date of Birth: 1951-07-24  Today's Date: 07/12/2016 OT Individual Time: 1300-1400 OT Individual Time Calculation (min): 60 min     Short Term Goals:Week 1:  OT Short Term Goal 1 (Week 1): STGs=LTGs secondary to ELOS  Skilled Therapeutic Interventions/Progress Updates:    Pt seen for OT session focusing on education with AE. Pt in supine upon arrival, agreeable to tx session. Education and demonstration provided throughout session regarding use of sock aid, reacher, LH shoe horn and elastic shoe laces. Pt completed LB dressing using reacher with increased time and encouragement. Pt becoming slightly frustrated with task, stating "I don't see why I have to do this, my husband can just do all this for me at home". Educated regarding importance of independence with ADLs as well as using ADLs as form of exercise to increase strength and functional endurance.  Pt provided with elastic shoe laces and trialed while using LH shoe horn. While pt more independent with whole process of donning shoes, she still requires assistance for proper positioning of shoes/ AFO.  Pt then educated regarding AE for pericare/ hygiene for toileting tasks as she requires assist due to difficulty reaching. Demonstrated with use of toilet aid and also made other recommendations such as BBQ tongs and toilet cleaner brush. Pt somewhat receptive, willing to look online to see options, though still voicing husband will assist.  With encouragement, pt self propelled w/c throughout unit for UE strengthening. In room, pt ambulated into bathroom, left seated on toilet, instructed to call nursing when done.   Therapy Documentation Precautions:  Precautions Precautions: Back Precaution Booklet Issued: No Precaution Comments: pt able to recall 3/3 precautions Required Braces or Orthoses: Spinal Brace Spinal Brace: Lumbar corset, Applied in  sitting position Restrictions Weight Bearing Restrictions: No Pain:   No/ denies pain  See Function Navigator for Current Functional Status.   Therapy/Group: Individual Therapy  Lewis, Veronnica Hennings C 07/12/2016, 7:22 AM

## 2016-07-12 NOTE — Progress Notes (Signed)
Social Work Patient ID: Karen Reynolds, female   DOB: 07/01/1951, 65 y.o.   MRN: [id number]  Met with pt yesterday afternoon to review team conference. She is aware and agreeable with targeted d/c date of 9/23 with mod ind/ supervision goals.  Discussed DME and follow up needs.  Will make d/c referrals.  Rayssa Atha, LCSW

## 2016-07-12 NOTE — Progress Notes (Signed)
Physical Therapy Session Note  Patient Details  Name: Karen Reynolds MRN: [id number] Date of Birth: Sep 16, 1951  Today's Date: 07/12/2016 PT Individual Time: 0800-0900 PT Individual Time Calculation (min): 60 min    Short Term Goals: Week 1:  PT Short Term Goal 1 (Week 1): STG=LTG  Skilled Therapeutic Interventions/Progress Updates:   Pt received seated in bed, c/o pain as below and agreeable to treatment. Supine>sit with bed features and S. B shoes/AFOs donned maxA due to back precautions and pt inability to tie shoes; able to slide foot into shoe once toes were placed into shoe, and able to thread calf strap, however could not tie shoes or pull up tongue. Gait x15' to sink with RW and S. Seated at sink d/t fatigue, pt performed hygiene with modI. Gait with RW and S x63'; HR after completion of trial 95 bpm and pt fatigued. After several minute rest break, able to perform second trial of gait x63' to return to w/c; performed with S. Ascent/descent two 3" stairs with min guard x2 trials.5 reps lateral step ups with LLE on 3" step and BUE support. Unable to perform on RLE. Sit <>stand from elevated mat table with cueing for glute activation for trunk/hip extension; 2x5 reps. Stand pivot transfer w/c >toilet with RW and S. Remained seated on toilet at end of session with instruction to use call bell when finished; pt verbalized understanding and NT alerted to pt position.   Therapy Documentation Precautions:  Precautions Precautions: Back Precaution Booklet Issued: No Precaution Comments: pt able to recall 3/3 precautions Required Braces or Orthoses: Spinal Brace Spinal Brace: Lumbar corset, Applied in sitting position Restrictions Weight Bearing Restrictions: No Pain: Pain Assessment Pain Assessment: 0-10 Pain Score: 5  Pain Type: Surgical pain Pain Location: Back Pain Orientation: Mid;Lower Pain Descriptors / Indicators: Aching Pain Frequency: Intermittent Pain Onset:  On-going Patients Stated Pain Goal: 2 Pain Intervention(s): Ambulation/increased activity;Repositioned Multiple Pain Sites: No   See Function Navigator for Current Functional Status.   Therapy/Group: Individual Therapy  Luberta Mutter 07/12/2016, 8:52 AM

## 2016-07-12 NOTE — Progress Notes (Signed)
Physical Therapy Session Note  Patient Details  Name: Karen Reynolds MRN: [id number] Date of Birth: [birth year]  Today's Date: 07/12/2016 PT Individual Time: [id number] PT Individual Time Calculation (min): 33 min    Short Term Goals: Week 1:  PT Short Term Goal 1 (Week 1): STG=LTG  Skilled Therapeutic Interventions/Progress Updates:    Pt received in bed & agreeable to PT, noting 6/10 pain in back but reports she will request pain medication after therapy session. Pt rolled R>R sidelying>sitting EOB with supervision and minimal cuing for log rolling technique. Attempted to find pt smaller cushion for w/c but unable to locate one. Pt donned back brace independently and therapist donned B shoes & AFO's total assist for time management. Pt completed stand pivot bed>w/c with RW & close supervision. Educated pt to square up to w/c, feel w/c on back of legs, and reach back with at least 1 UE before transferring stand>sit with poor demo by pt throughout session. Transported pt room>gym via w/c total assist for time management. Utilized nu-step level 4 x 6 minutes and level 6 x 4 minutes with pt reporting 13 on Borg RPE scale. At end of session pt returned to bed via stand pivot with RW in same manner as noted above. Pt left in bed with all needs within reach & family present.   Educated pt & husband on need for pt to wear B AFO's at all times when ambulating, even if ambulating short distances (ex: to bathroom).  Therapy Documentation Precautions:  Precautions Precautions: Back Precaution Booklet Issued: No Precaution Comments: pt able to recall 3/3 precautions Required Braces or Orthoses: Spinal Brace Spinal Brace: Lumbar corset, Applied in sitting position Restrictions Weight Bearing Restrictions: No  Pain: Pain Assessment Pain Assessment: 0-10 Pain Score: 6  Pain Location: Back Pain Intervention(s):  (reports she will request pain medication after PT session)   See Function Navigator  for Current Functional Status.   Therapy/Group: Individual Therapy  Waunita Schooner 07/12/2016, 3:54 PM

## 2016-07-12 NOTE — Progress Notes (Signed)
Occupational Therapy Session Note  Patient Details  Name: Karen Reynolds MRN: [id number] Date of Birth: 07/25/1951  Today's Date: 07/12/2016 OT Individual Time: 0915-1000 OT Individual Time Calculation (min): 45 min     Short Term Goals:Week 1:  OT Short Term Goal 1 (Week 1): STGs=LTGs secondary to ELOS  Skilled Therapeutic Interventions/Progress Updates:    Pt seen for skilled OT to facilitate use of AE and functional mobility with basic ADL retraining. Upon entering room, pt vomiting and therapist obtained [place] and drink for pt.  Her AFO straps were pressing into her calves due to LE edema. Discussed with pt and RN use of knee high TED hose, ordered L TEDs for pt.  Pt was agreeable to bath bedside and donning clothing with reacher. She can not reach her back side in standing so she needs to lay back in bed to wash bottom. This continues to create difficulty for her with toileting tasks. Discussed need to practice with AE to prepare for discharge home. Pt feeling better at end of session resting in her w/c.  Therapy Documentation Precautions:  Precautions Precautions: Back Precaution Booklet Issued: No Precaution Comments: pt able to recall 3/3 precautions Required Braces or Orthoses: Spinal Brace Spinal Brace: Lumbar corset, Applied in sitting position Restrictions Weight Bearing Restrictions: No    Vital Signs: Therapy Vitals Temp: 97.9 F (36.6 C) Temp Source: Oral Pulse Rate: 74 Resp: 17 BP: 128/60 Patient Position (if appropriate): Lying Oxygen Therapy SpO2: 97 % O2 Device: Not Delivered Pain: Pain Assessment Pain Assessment: 0-10 Pain Score: 6  Pain Type: Acute pain Pain Location: Back Pain Orientation: Mid;Lower Pain Descriptors / Indicators: Aching Pain Frequency: Intermittent Pain Onset: On-going Patients Stated Pain Goal: 2 Pain Intervention(s): Medication (See eMAR) ADL:  See Function Navigator for Current Functional Status.   Therapy/Group:  Individual Therapy  SAGUIER,JULIA 07/12/2016, 8:31 AM

## 2016-07-12 NOTE — Progress Notes (Signed)
Recreational Therapy Session Note  Patient Details  Name: Karen Reynolds MRN: [id number] Date of Birth: 05-27-1951 Today's Date: 07/12/2016  Pain: no c/o Skilled Therapeutic Interventions/Progress Updates: Pt completed simple meal prep activity seated w/c level with supervision/set up assist.  Discussion with pt about energy conservation techniques.  Pt discussed excitement about discharge home this Saturday.  Therapy/Group: Individual Therapy   Sonora Catlin 07/12/2016, 3:44 PM

## 2016-07-12 NOTE — Progress Notes (Signed)
Patient ID: Karen Reynolds, female   DOB: 04-14-1951, 65 y.o.   MRN: [id number] Vital signs ok Feels fair with modest back soreness Motor function without improvement Continues rehab... Dressing removed.

## 2016-07-12 NOTE — Plan of Care (Signed)
Problem: RH Balance Goal: LTG Patient will maintain dynamic standing with ADLs (OT) LTG:  Patient will maintain dynamic standing balance with assist during activities of daily living (OT)   LTG downgraded due to low activity tolerance and back pain.  Problem: RH Grooming Goal: LTG Patient will perform grooming w/assist,cues/equip (OT) LTG: Patient will perform grooming with assist, with/without cues using equipment (OT)  LTG downgraded due to low activity tolerance and back pain.  Problem: RH Bathing Goal: LTG Patient will bathe with assist, cues/equipment (OT) LTG: Patient will bathe specified number of body parts with assist with/without cues using equipment (position)  (OT)  LTG downgraded due to low activity tolerance and back pain.  Problem: RH Dressing Goal: LTG Patient will perform upper body dressing (OT) LTG Patient will perform upper body dressing with assist, with/without cues (OT).  LTG downgraded due to low activity tolerance and back pain. Goal: LTG Patient will perform lower body dressing w/assist (OT) LTG: Patient will perform lower body dressing with assist, with/without cues in positioning using equipment (OT)  LTG downgraded due to low activity tolerance and back pain.  Problem: RH Toileting Goal: LTG Patient will perform toileting w/assist, cues/equip (OT) LTG: Patient will perform toiletiing (clothes management/hygiene) with assist, with/without cues using equipment (OT)  LTG downgraded due to low activity tolerance and back pain.  Problem: RH Simple Meal Prep Goal: LTG Patient will perform simple meal prep w/assist (OT) LTG: Patient will perform simple meal prep with assistance, with/without cues (OT).  LTG downgraded due to low activity tolerance and back pain.  Problem: RH Laundry Goal: LTG Patient will perform laundry w/assist, cues (OT) LTG: Patient will perform laundry with assistance, with/without cues (OT).  LTG downgraded due to low activity tolerance  and back pain.  Problem: RH Toilet Transfers Goal: LTG Patient will perform toilet transfers w/assist (OT) LTG: Patient will perform toilet transfers with assist, with/without cues using equipment (OT)  LTG downgraded due to low activity tolerance and back pain.

## 2016-07-12 NOTE — Patient Care Conference (Signed)
Inpatient RehabilitationTeam Conference and Plan of Care Update Date: 07/11/2016   Time: 2:20 PM    Patient Name: Karen Reynolds      Medical Record Number: [id number]  Date of Birth: [birth year] Sex: Female         Room/Bed: 4M08C/4M08C-02 Payor Info: Payor: [employer] / Plan: [employer] / Product Type: *No Product type* /    Admitting Diagnosis: Lumbar Fusion  Admit Date/Time:  07/07/2016  2:39 PM Admission Comments: No comment available   Primary Diagnosis:  Lumbar radiculopathy Principal Problem: Lumbar radiculopathy  Patient Active Problem List   Diagnosis Date Noted  . Lumbar radiculopathy 07/07/2016  . Essential hypertension 07/07/2016  . Lumbar stenosis 07/03/2016  . Peripheral neuropathy ([place]) 03/09/2016  . Spondylolisthesis of lumbar region 11/23/2015    Expected Discharge Date: Expected Discharge Date: 07/15/16  Team Members Present: Physician leading conference: Dr. Alger Simons Social Worker Present: Lennart Pall, LCSW Nurse Present: Heather Roberts, RN PT Present: Kem Parkinson, PT OT Present: Napoleon Form, OT SLP Present: Gunnar Fusi, SLP PPS Coordinator present : Daiva Nakayama, RN, CRRN     Current Status/Progress Goal Weekly Team Focus  Medical   lumbar stenosis with multiple radiculopathies, foot drop. anemia requiring transfusion  improve lower extremity strength and stability  wound care, pain, orthotics   Bowel/Bladder   Continent of bowel/bladder. LBM 07/09/16  Remain continent of bowel/bladder while in RH with min assist.  Monitor bowel/bladder function q shift and as needed.   Swallow/Nutrition/ Hydration             ADL's   min A LB dressing and toileting, touching/steadying A with transfers and bathing, mod A dynamic balance  mod I overall (LB dressing may need to be adjusted to min A as spouse has always helped her with her shoes/AFOs)  ADL training, AE training, pt education, activity tolerance   Mobility   modA bed mobility,  min guard transfers and gait with RW up to 20'  modI overall, S two stairs with RW  activity tolerance, LE strengthening, bed mobility   Communication             Safety/Cognition/ Behavioral Observations            Pain   complained of back pain of 7/10. Percocert 1 tab given.  <3  Monitor and treat pain q shift and as needed   Skin   surgical incision on mid lower back with honeycomb dressing.  skin to be free of new skin break/infection while in [facility] with min assit  assess skin q shift and as needed.    Rehab Goals Patient on target to meet rehab goals: Yes *See Care Plan and progress notes for long and short-term goals.  Barriers to Discharge: premorbid weakness--will be long term, limited pulmonary stamina PTA    Possible Resolutions to Barriers:  appropriate use of orthotics, increase stamina as possible    Discharge Planning/Teaching Needs:  Plan home with spouse who can provide any needed assistance.      Team Discussion:  transfused yesterday.  Current plastic AFOs are not working and need to encourage using others she has.   Goals set for mod independent but may downgrade slightly (husband available).  Revisions to Treatment Plan:  None   Continued Need for Acute Rehabilitation Level of Care: The patient requires daily medical management by a physician with specialized training in physical medicine and rehabilitation for the following conditions: Daily direction of a multidisciplinary physical rehabilitation program  to ensure safe treatment while eliciting the highest outcome that is of practical value to the patient.: Yes Daily medical management of patient stability for increased activity during participation in an intensive rehabilitation regime.: Yes Daily analysis of laboratory values and/or radiology reports with any subsequent need for medication adjustment of medical intervention for : Post surgical problems;Neurological problems  Tia Gelb 07/12/2016, 3:37 PM

## 2016-07-12 NOTE — Progress Notes (Signed)
Pt called stating she wanted to take the [facility] boots off as they were uncomfortable. We took them off and could see minor indentations from the straps where she has edema in BLEs. She requested to keep them off "for a while" vs. readjusting the straps. Education provided regarding purpose of boots and need to wear them. Continue to monitor. Hortencia Conradi RN

## 2016-07-12 NOTE — Progress Notes (Signed)
Patient ID: Karen Reynolds, female   DOB: [birth year], 65 y.o.   MRN: [id number]  Subjective/Complaints: No new complaints. Had a good day with therapy. Felt that AFO's were effective. Pain under control  Discussed keeping IV in until we are sure she does not need a transfusion ROS no CP, SOB, N/V/D Objective: Vital Signs: Blood pressure 128/60, pulse 74, temperature 97.9 F (36.6 C), temperature source Oral, resp. rate 17, height 5\' 5"  (1.651 m), weight 102.6 kg (226 lb 3.1 oz), SpO2 97 %. No results found. Results for orders placed or performed during the hospital encounter of 07/07/16 (from the past 72 hour(s))  Glucose, capillary     Status: Abnormal   Collection Time: 07/09/16 12:12 PM  Result Value Ref Range   Glucose-Capillary 124 (H) 65 - 99 mg/dL  Glucose, capillary     Status: Abnormal   Collection Time: 07/09/16  5:43 PM  Result Value Ref Range   Glucose-Capillary 176 (H) 65 - 99 mg/dL  Glucose, capillary     Status: Abnormal   Collection Time: 07/09/16  9:09 PM  Result Value Ref Range   Glucose-Capillary 146 (H) 65 - 99 mg/dL   Comment 1 Notify RN   CBC     Status: Abnormal   Collection Time: 07/10/16  4:43 AM  Result Value Ref Range   WBC 6.5 4.0 - 10.5 K/uL   RBC 2.56 (L) 3.87 - 5.11 MIL/uL   Hemoglobin 6.9 (LL) 12.0 - 15.0 g/dL    Comment: REPEATED TO VERIFY CRITICAL RESULT CALLED TO, READ BACK BY AND VERIFIED WITH: D.DONNIER,RN [id number] 07/10/16 M.CAMPBELL    HCT 22.3 (L) 36.0 - 46.0 %   MCV 87.1 78.0 - 100.0 fL   MCH 27.0 26.0 - 34.0 pg   MCHC 30.9 30.0 - 36.0 g/dL   RDW 16.0 (H) 11.5 - 15.5 %   Platelets 302 150 - 400 K/uL  Glucose, capillary     Status: Abnormal   Collection Time: 07/10/16  6:36 AM  Result Value Ref Range   Glucose-Capillary 132 (H) 65 - 99 mg/dL   Comment 1 Notify RN   Prepare RBC     Status: None   Collection Time: 07/10/16  9:00 AM  Result Value Ref Range   Order Confirmation ORDER PROCESSED BY BLOOD BANK   Glucose, capillary      Status: Abnormal   Collection Time: 07/10/16 11:38 AM  Result Value Ref Range   Glucose-Capillary 105 (H) 65 - 99 mg/dL  Glucose, capillary     Status: Abnormal   Collection Time: 07/10/16  4:58 PM  Result Value Ref Range   Glucose-Capillary 121 (H) 65 - 99 mg/dL  Glucose, capillary     Status: Abnormal   Collection Time: 07/10/16  9:04 PM  Result Value Ref Range   Glucose-Capillary 105 (H) 65 - 99 mg/dL  Glucose, capillary     Status: Abnormal   Collection Time: 07/11/16  6:45 AM  Result Value Ref Range   Glucose-Capillary 117 (H) 65 - 99 mg/dL  Glucose, capillary     Status: Abnormal   Collection Time: 07/11/16 11:40 AM  Result Value Ref Range   Glucose-Capillary 137 (H) 65 - 99 mg/dL  Glucose, capillary     Status: Abnormal   Collection Time: 07/11/16  4:46 PM  Result Value Ref Range   Glucose-Capillary 118 (H) 65 - 99 mg/dL  Glucose, capillary     Status: Abnormal   Collection Time: 07/11/16  9:04 PM  Result Value Ref Range   Glucose-Capillary 115 (H) 65 - 99 mg/dL  CBC     Status: Abnormal   Collection Time: 07/12/16  5:13 AM  Result Value Ref Range   WBC 8.8 4.0 - 10.5 K/uL   RBC 3.89 3.87 - 5.11 MIL/uL   Hemoglobin 10.5 (L) 12.0 - 15.0 g/dL   HCT 34.2 (L) 36.0 - 46.0 %   MCV 87.9 78.0 - 100.0 fL   MCH 27.0 26.0 - 34.0 pg   MCHC 30.7 30.0 - 36.0 g/dL   RDW 15.5 11.5 - 15.5 %   Platelets 434 (H) 150 - 400 K/uL  Glucose, capillary     Status: Abnormal   Collection Time: 07/12/16  6:45 AM  Result Value Ref Range   Glucose-Capillary 116 (H) 65 - 99 mg/dL     HEENT: normal Cardio: RRR Resp: CTA B/L and unlabored GI: CTA B/L and unlabored Extremity:  No Edema Skin:   Other lumbar incision small amt dried blood on honeycomb dressing--stable---no further drainage noted Neuro: Alert/Oriented, Abnormal Sensory reduced bilateral feet and Abnormal Motor o/5 bilateral ankle DF, 3- Left HF, KE, 4- R HF, KE Musc/Skel:  Heel cords are tight Gen  NAD   Assessment/Plan: 1. Functional deficits secondary to Lumbar stenosis with multilevel radiculopathies which require 3+ hours per day of interdisciplinary therapy in a comprehensive inpatient rehab setting. Physiatrist is providing close team supervision and 24 hour management of active medical problems listed below. Physiatrist and rehab team continue to assess barriers to discharge/monitor patient progress toward functional and medical goals. FIM: Function - Bathing Position: Shower Body parts bathed by patient: Right arm, Left arm, Chest, Abdomen, Right upper leg, Left upper leg, Front perineal area, Buttocks, Right lower leg, Left lower leg Body parts bathed by helper: Buttocks, Right lower leg, Left lower leg, Back Bathing not applicable: Back Assist Level: Touching or steadying assistance(Pt > 75%)  Function- Upper Body Dressing/Undressing What is the patient wearing?: Pull over shirt/dress, Orthosis Pull over shirt/dress - Perfomed by patient: Put head through opening, Thread/unthread right sleeve, Thread/unthread left sleeve, Pull shirt over trunk Orthosis activity level: Performed by patient Assist Level: Set up Set up : To apply TLSO, cervical collar, To obtain clothing/put away Function - Lower Body Dressing/Undressing What is the patient wearing?: Pants, Socks, Shoes, AFO Position: Sitting EOB Pants- Performed by patient: Thread/unthread right pants leg, Thread/unthread left pants leg, Pull pants up/down Pants- Performed by helper: Thread/unthread right pants leg, Thread/unthread left pants leg Socks - Performed by helper: Don/doff right sock, Don/doff left sock Shoes - Performed by helper: Don/doff right shoe, Don/doff left shoe, Fasten right, Fasten left AFO - Performed by helper: Don/doff right AFO, Don/doff left AFO Assist for footwear: Dependant Assist for lower body dressing:  (max A)  Function - Toileting Toileting steps completed by patient: Adjust clothing  prior to toileting, Adjust clothing after toileting Toileting steps completed by helper: Performs perineal hygiene Toileting Assistive Devices: Grab bar or rail Assist level: Touching or steadying assistance (Pt.75%)  Function - Air cabin crew transfer assistive device: Elevated toilet seat/BSC over toilet Assist level to toilet: Supervision or verbal cues Assist level from toilet: Supervision or verbal cues  Function - Chair/bed transfer Chair/bed transfer method: Ambulatory Chair/bed transfer assist level: Supervision or verbal cues Chair/bed transfer assistive device: Walker Chair/bed transfer details: Verbal cues for precautions/safety  Function - Locomotion: Wheelchair Will patient use wheelchair at discharge?: Yes Type: Manual Max wheelchair distance: 60 ft Assist Level: Supervision  or verbal cues Assist Level: Supervision or verbal cues Wheel 150 feet activity did not occur: Safety/medical concerns Turns around,maneuvers to table,bed, and toilet,negotiates 3% grade,maneuvers on rugs and over doorsills: Yes Function - Locomotion: Ambulation Assistive device: Walker-rolling Max distance: 63 Assist level: Supervision or verbal cues Assist level: Supervision or verbal cues Walk 50 feet with 2 turns activity did not occur: Safety/medical concerns Assist level: Supervision or verbal cues Walk 150 feet activity did not occur: Safety/medical concerns Walk 10 feet on uneven surfaces activity did not occur: Safety/medical concerns  Function - Comprehension Comprehension: Auditory Comprehension assist level: Follows complex conversation/direction with extra time/assistive device  Function - Expression Expression: Verbal Expression assist level: Expresses complex ideas: With extra time/assistive device  Function - Social Interaction Social Interaction assist level: Interacts appropriately with others with medication or extra time (anti-anxiety,  antidepressant).  Function - Problem Solving Problem solving assist level: Solves complex 90% of the time/cues < 10% of the time  Function - Memory Memory assist level: Recognizes or recalls 90% of the time/requires cueing < 10% of the time Patient normally able to recall (first 3 days only): Current season    Medical Problem List and Plan: 1.  Functional and mobility deficits secondary to lumbar spondylosis with stenosis and radiculopathies              -PT, OT continue             -using carbon AFO's. Feels that they are easier to put on now--willing to use at home  -  tension AFO to stretch heel cords 2.  DVT Prophylaxis/Anticoagulation: Mechanical: Sequential compression devices, below knee Bilateral lower extremities 3. Pain Management: continue oxycodone prn 4. Mood: LCSW to follow for evaluation and sup 5. Neuropsych: This patient is capable of making decisions on his own behalf. 6. Skin/Wound Care: Monitor wound for healing.               -superior portion of wound now dry  -changed to dry dressing---superior area still with small amount of drainage  7. Fluids/Electrolytes/Nutrition: Monitor I/O. Offer supplements 8. HTN: Monitor BP bid and titrate medications as indicated. Continue Norvasc, HCTZ and metoprolol with parameters. Vitals:   07/11/16 2000 07/12/16 0501  BP: (!) 124/52 128/60  Pulse:  74  Resp:  17  Temp:  97.9 F (36.6 C)   9. T2DM: Monitor BS ac/hs. Continue metformin bid.   -improved control CBG (last 3)   Recent Labs  07/11/16 1646 07/11/16 2104 07/12/16 0645  GLUCAP 118* 115* 116*    10. Dysuria:cipro completed 11. ABLA:     -receceived 2u PRBC's  -re-check hgb 10.5 today 12. H/o Gout: On allopurinol. Monitor for flare up. 13. Diarrhea: improved after stopping laxatives.  14. Dyspepsia:  pepcid to help manage symptoms.    LOS (Days) 5 A FACE TO FACE EVALUATION WAS PERFORMED  Wallace Cogliano T 07/12/2016, 9:16 AM

## 2016-07-13 ENCOUNTER — Inpatient Hospital Stay (HOSPITAL_COMMUNITY): Payer: PPO | Admitting: Occupational Therapy

## 2016-07-13 ENCOUNTER — Inpatient Hospital Stay (HOSPITAL_COMMUNITY): Payer: PPO | Admitting: Physical Therapy

## 2016-07-13 DIAGNOSIS — M5416 Radiculopathy, lumbar region: Secondary | ICD-10-CM | POA: Diagnosis not present

## 2016-07-13 LAB — GLUCOSE, CAPILLARY
GLUCOSE-CAPILLARY: 105 mg/dL — AB (ref 65–99)
GLUCOSE-CAPILLARY: 152 mg/dL — AB (ref 65–99)
GLUCOSE-CAPILLARY: 96 mg/dL (ref 65–99)
Glucose-Capillary: 97 mg/dL (ref 65–99)

## 2016-07-13 NOTE — Plan of Care (Signed)
Problem: RH Laundry Goal: LTG Patient will perform laundry w/assist, cues (OT) LTG: Patient will perform laundry with assistance, with/without cues (OT).  LTG D/C'd as pt states she does not do any laundry at home.

## 2016-07-13 NOTE — Plan of Care (Signed)
Problem: RH Stairs Goal: LTG Patient will ambulate up and down stairs w/assist (PT) LTG: Patient will ambulate up and down # of stairs with assistance (PT)  Outcome: Not Applicable Date Met: 18/56/31 D/c goal; N/a for home access, recommend pt use ramp at home

## 2016-07-13 NOTE — Progress Notes (Signed)
Patient ID: Karen Reynolds, female   DOB: [birth year], 65 y.o.   MRN: [id number]  Subjective/Complaints: Feeling well. Likes braces! Would like to go home at the end of the day tomorrow  Discussed keeping IV in until we are sure she does not need a transfusion ROS no CP, SOB, N/V/D Objective: Vital Signs: Blood pressure 137/65, pulse 74, temperature 98.3 F (36.8 C), temperature source Oral, resp. rate 18, height 5\' 5"  (1.651 m), weight 102.3 kg (225 lb 8 oz), SpO2 96 %. No results found. Results for orders placed or performed during the hospital encounter of 07/07/16 (from the past 72 hour(s))  Prepare RBC     Status: None   Collection Time: 07/10/16  9:00 AM  Result Value Ref Range   Order Confirmation ORDER PROCESSED BY BLOOD BANK   Glucose, capillary     Status: Abnormal   Collection Time: 07/10/16 11:38 AM  Result Value Ref Range   Glucose-Capillary 105 (H) 65 - 99 mg/dL  Glucose, capillary     Status: Abnormal   Collection Time: 07/10/16  4:58 PM  Result Value Ref Range   Glucose-Capillary 121 (H) 65 - 99 mg/dL  Glucose, capillary     Status: Abnormal   Collection Time: 07/10/16  9:04 PM  Result Value Ref Range   Glucose-Capillary 105 (H) 65 - 99 mg/dL  Glucose, capillary     Status: Abnormal   Collection Time: 07/11/16  6:45 AM  Result Value Ref Range   Glucose-Capillary 117 (H) 65 - 99 mg/dL  Glucose, capillary     Status: Abnormal   Collection Time: 07/11/16 11:40 AM  Result Value Ref Range   Glucose-Capillary 137 (H) 65 - 99 mg/dL  Glucose, capillary     Status: Abnormal   Collection Time: 07/11/16  4:46 PM  Result Value Ref Range   Glucose-Capillary 118 (H) 65 - 99 mg/dL  Glucose, capillary     Status: Abnormal   Collection Time: 07/11/16  9:04 PM  Result Value Ref Range   Glucose-Capillary 115 (H) 65 - 99 mg/dL  CBC     Status: Abnormal   Collection Time: 07/12/16  5:13 AM  Result Value Ref Range   WBC 8.8 4.0 - 10.5 K/uL   RBC 3.89 3.87 - 5.11 MIL/uL   Hemoglobin 10.5 (L) 12.0 - 15.0 g/dL   HCT 34.2 (L) 36.0 - 46.0 %   MCV 87.9 78.0 - 100.0 fL   MCH 27.0 26.0 - 34.0 pg   MCHC 30.7 30.0 - 36.0 g/dL   RDW 15.5 11.5 - 15.5 %   Platelets 434 (H) 150 - 400 K/uL  Glucose, capillary     Status: Abnormal   Collection Time: 07/12/16  6:45 AM  Result Value Ref Range   Glucose-Capillary 116 (H) 65 - 99 mg/dL  Glucose, capillary     Status: Abnormal   Collection Time: 07/12/16 11:39 AM  Result Value Ref Range   Glucose-Capillary 136 (H) 65 - 99 mg/dL  Glucose, capillary     Status: None   Collection Time: 07/12/16  4:33 PM  Result Value Ref Range   Glucose-Capillary 99 65 - 99 mg/dL  Glucose, capillary     Status: Abnormal   Collection Time: 07/12/16  8:53 PM  Result Value Ref Range   Glucose-Capillary 110 (H) 65 - 99 mg/dL  Glucose, capillary     Status: Abnormal   Collection Time: 07/13/16  6:38 AM  Result Value Ref Range   Glucose-Capillary 105 (H)  65 - 99 mg/dL     HEENT: normal Cardio: RRR Resp: CTA B/L and unlabored GI: CTA B/L and unlabored Extremity:  No Edema Skin:   Other lumbar incision small amt dried blood on honeycomb dressing--stable---no further drainage noted Neuro: Alert/Oriented, Abnormal Sensory reduced bilateral feet and Abnormal Motor o/5 bilateral ankle DF, 3- Left HF, KE, 4- R HF, KE---no change Musc/Skel:  Heel cords are tight Gen NAD   Assessment/Plan: 1. Functional deficits secondary to Lumbar stenosis with multilevel radiculopathies which require 3+ hours per day of interdisciplinary therapy in a comprehensive inpatient rehab setting. Physiatrist is providing close team supervision and 24 hour management of active medical problems listed below. Physiatrist and rehab team continue to assess barriers to discharge/monitor patient progress toward functional and medical goals. FIM: Function - Bathing Position: Sitting EOB Body parts bathed by patient: Right arm, Left arm, Chest, Abdomen, Front perineal  area, Buttocks Body parts bathed by helper: Back Bathing not applicable: Right upper leg, Left upper leg, Right lower leg, Left lower leg Assist Level: Touching or steadying assistance(Pt > 75%)  Function- Upper Body Dressing/Undressing What is the patient wearing?: Pull over shirt/dress, Orthosis Pull over shirt/dress - Perfomed by patient: Put head through opening, Thread/unthread right sleeve, Thread/unthread left sleeve, Pull shirt over trunk Orthosis activity level: Performed by patient Assist Level: Set up Set up : To apply TLSO, cervical collar, To obtain clothing/put away Function - Lower Body Dressing/Undressing What is the patient wearing?: Pants, Socks, Shoes, AFO Position: Wheelchair/chair at sink Pants- Performed by patient: Thread/unthread right pants leg, Thread/unthread left pants leg, Pull pants up/down Pants- Performed by helper: Thread/unthread right pants leg, Thread/unthread left pants leg Socks - Performed by patient: Don/doff right sock (practice with sock aid) Socks - Performed by helper: Don/doff right sock, Don/doff left sock Shoes - Performed by patient: Don/doff right shoe, Don/doff left shoe (Elastic shoe laces) Shoes - Performed by helper: Don/doff right shoe, Don/doff left shoe, Fasten right, Fasten left AFO - Performed by helper: Don/doff right AFO, Don/doff left AFO Assist for footwear: Partial/moderate assist Assist for lower body dressing: Supervision or verbal cues Management consultant)  Function - Toileting Toileting steps completed by patient: Adjust clothing prior to toileting Toileting steps completed by helper: Performs perineal hygiene, Adjust clothing after toileting Toileting Assistive Devices: Grab bar or rail Assist level: Touching or steadying assistance (Pt.75%)  Function - Air cabin crew transfer assistive device: Elevated toilet seat/BSC over toilet, Walker Assist level to toilet: Supervision or verbal cues Assist level from toilet:  Supervision or verbal cues  Function - Chair/bed transfer Chair/bed transfer method: Stand pivot Chair/bed transfer assist level: Supervision or verbal cues Chair/bed transfer assistive device: Walker Chair/bed transfer details: Verbal cues for precautions/safety  Function - Locomotion: Wheelchair Will patient use wheelchair at discharge?: Yes Type: Manual Max wheelchair distance: 60 ft Assist Level: Supervision or verbal cues Assist Level: Supervision or verbal cues Wheel 150 feet activity did not occur: Safety/medical concerns Turns around,maneuvers to table,bed, and toilet,negotiates 3% grade,maneuvers on rugs and over doorsills: Yes Function - Locomotion: Ambulation Assistive device: Walker-rolling Max distance: 65 Assist level: Supervision or verbal cues Assist level: Supervision or verbal cues Walk 50 feet with 2 turns activity did not occur: Safety/medical concerns Assist level: Supervision or verbal cues Walk 150 feet activity did not occur: Safety/medical concerns Walk 10 feet on uneven surfaces activity did not occur: Safety/medical concerns  Function - Comprehension Comprehension: Auditory Comprehension assist level: Follows complex conversation/direction with no assist  Function -  Expression Expression: Verbal Expression assist level: Expresses complex ideas: With no assist  Function - Social Interaction Social Interaction assist level: Interacts appropriately with others - No medications needed.  Function - Problem Solving Problem solving assist level: Solves complex problems: Recognizes & self-corrects  Function - Memory Memory assist level: Complete Independence: No helper Patient normally able to recall (first 3 days only): Current season    Medical Problem List and Plan: 1.  Functional and mobility deficits secondary to lumbar spondylosis with stenosis and radiculopathies              -PT, OT continue             -using carbon AFO's. Feels that they  are easier to put on now--willing to use at home  -  tension AFO to stretch heel cords 2.  DVT Prophylaxis/Anticoagulation: Mechanical: Sequential compression devices, below knee Bilateral lower extremities 3. Pain Management: continue oxycodone prn 4. Mood: LCSW to follow for evaluation and sup 5. Neuropsych: This patient is capable of making decisions on his own behalf. 6. Skin/Wound Care: Monitor wound for healing.               -superior portion of wound now dry  -changed to dry dressing---superior area still with small amount of drainage  7. Fluids/Electrolytes/Nutrition: Monitor I/O. Offer supplements 8. HTN: Monitor BP bid and titrate medications as indicated. Continue Norvasc, HCTZ and metoprolol with parameters. Vitals:   07/12/16 2003 07/13/16 0640  BP: (!) 131/46 137/65  Pulse: 75 74  Resp:  18  Temp:  98.3 F (36.8 C)   9. T2DM: Monitor BS ac/hs. Continue metformin bid.   -improved control CBG (last 3)   Recent Labs  07/12/16 1633 07/12/16 2053 07/13/16 0638  GLUCAP 99 110* 105*    10. Dysuria:cipro completed 11. ABLA:     -receceived 2u PRBC's  -re-check hgb 10.5  12. H/o Gout: On allopurinol. Monitor for flare up. 13. Diarrhea: improved after stopping laxatives.  14. Dyspepsia:  pepcid to help manage symptoms.    LOS (Days) 6 A FACE TO FACE EVALUATION WAS PERFORMED  Zamyah Wiesman T 07/13/2016, 8:49 AM

## 2016-07-13 NOTE — Progress Notes (Signed)
Recreational Therapy Discharge Summary Patient Details  Name: ADAMARIS KING MRN: [id number] Date of Birth: 13-Jan-1951 Today's Date: 07/13/2016  Long term goals set: 1  Long term goals met: 1  Comments on progress toward goals: Pt met supervision/set up assist level for simple TR tasks.  Pt requires encouragement to attempt standing activities as she states she didn't stand before for task completion.  Pt is scheduled for discharge home with family at overall supervision-mod I level.   easons for discharge: treatment goals met  Patient/family agrees with progress made and goals achieved: Yes  Harl Wiechmann 07/13/2016, 8:45 AM

## 2016-07-13 NOTE — Progress Notes (Signed)
Occupational Therapy Session Note  Patient Details  Name: Karen Reynolds MRN: [id number] Date of Birth: [birth year]  Today's Date: 07/13/2016 OT Individual Time: 1330-1430 OT Individual Time Calculation (min): 60 min     Short Term Goals: Week 1:  OT Short Term Goal 1 (Week 1): STGs=LTGs secondary to ELOS  Skilled Therapeutic Interventions/Progress Updates:   1:1 Therapeutic activity to work on standing tolerance, funcitonal sit to stands and functional ambulation short distances. Discussed d/c plans and energy conservation. Pt able toperform all sit to stands with supervision and short distance functional ambulation with RW to rest breaks. Ambulated from room to RN station with 2 rest breaks but too fatigued to return back to her room. Pt returned to bed supine and required A for bilateral LEs to get into bed. Once in bed pt able to position herself.   Therapy Documentation Precautions:  Precautions Precautions: Back Precaution Booklet Issued: No Precaution Comments: pt able to recall 3/3 precautions Required Braces or Orthoses: Spinal Brace Spinal Brace: Lumbar corset, Applied in sitting position Restrictions Weight Bearing Restrictions: No Pain: Pain Assessment Pain Assessment: No/denies pain  See Function Navigator for Current Functional Status.   Therapy/Group: Individual Therapy  Willeen Cass [facility] 07/13/2016, 3:51 PM

## 2016-07-13 NOTE — Progress Notes (Signed)
Occupational Therapy Session Note  Patient Details  Name: SHANTANA CHRISTON MRN: [id number] Date of Birth: 1951/10/05  Today's Date: 07/13/2016 OT Individual Time: 0174-9449 OT Individual Time Calculation (min): 90 min     Short Term Goals:Week 1:  OT Short Term Goal 1 (Week 1): STGs=LTGs secondary to ELOS  Skilled Therapeutic Interventions/Progress Updates:    Pt seen for skilled OT to facilitate functional mobility, activity tolerance and use of AE. Pt is able to complete all transfers with RW with S. She uses sock aid, reach, elastic laces. She does need A to don shoes as she has difficulty with toes curling up in shoes.  She is S with toileting after urinating only.  Pt encouraged to work on standing, but could only tolerate 1 min and then stated she was too fatigued to continue.  UE exercises with 2# dowel bar with 10 reps of sh press, chest press, front and back rowing 10 reps, 4 sets. Pt resting in recliner with all needs met.  Therapy Documentation Precautions:  Precautions Precautions: Back Precaution Booklet Issued: No Precaution Comments: pt able to recall 3/3 precautions Required Braces or Orthoses: Spinal Brace Spinal Brace: Lumbar corset, Applied in sitting position Restrictions Weight Bearing Restrictions: No    Vital Signs: Therapy Vitals Temp: 98.3 F (36.8 C) Temp Source: Oral Pulse Rate: 74 Resp: 18 BP: 137/65 Patient Position (if appropriate): Lying Oxygen Therapy SpO2: 96 % O2 Device: Not Delivered Pain: Pain Assessment Pain Assessment: 0-10 Pain Score: 6  Pain Type: Acute pain Pain Location: Back Pain Orientation: Lower Pain Descriptors / Indicators: Aching Pain Frequency: Intermittent Pain Onset: On-going Patients Stated Pain Goal: 2 Pain Intervention(s): Medication (See eMAR);Repositioned Multiple Pain Sites: No ADL:   See Function Navigator for Current Functional Status.   Therapy/Group: Individual Therapy  Lamarkus Nebel 07/13/2016, 8:45  AM

## 2016-07-13 NOTE — Progress Notes (Signed)
Physical Therapy Session Note  Patient Details  Name: Karen Reynolds MRN: [id number] Date of Birth: 1951-08-03  Today's Date: 07/13/2016 PT Individual Time: 0800-0900 PT Individual Time Calculation (min): 60 min    Short Term Goals: Week 1:  PT Short Term Goal 1 (Week 1): STG=LTG  Skilled Therapeutic Interventions/Progress Updates:   Pt received supine in bed, c/o pain in low back as below and reports recently receiving pain medication. Supine>sit with HOB elevated and bedrails, modI. Shoes/AFOs donned maxA. Gait to sink with RW and S x10'. Seated at sink for energy conservation, pt performed oral hygiene with modI. Gait into bathroom with RW and S. Pt able to pull down pants without assist, required assist for hygiene and pulling pants up. Gait with RW and S x4 trials (x65', x45', x50', x30') with seated rest breaks between trials d/t fatigue. Sit <>stand from mat table with light UE support on RW/mat table, 2x7 reps. Attempted ascent of 4" step with RW, however upon placing RW and LLE onto step, pt very fearful and reports she does not feel strong enough to safely perform. D/c'ed stair goal at this time, as pt has small ramps at home for home entry and 2 steps in split-level. Discussed with pt and recommend that pt use ramps in w/c when returning home and continue practice on stairs with HHPT before performing independently; pt agreeable. Pt became slightly emotional, stating she felt frustrated by all the things she is unable to do. Standing toe taps to cone for weight shifting, SLS balance and strength, and endurance. Returned to room totalA for energy conservation. Stand pivot transfer to recliner with RW and S. Remained seated in recliner with all needs in reach at end of session.   Therapy Documentation Precautions:  Precautions Precautions: Back Precaution Booklet Issued: No Precaution Comments: pt able to recall 3/3 precautions Required Braces or Orthoses: Spinal Brace Spinal Brace:  Lumbar corset, Applied in sitting position Restrictions Weight Bearing Restrictions: No Pain: Pain Assessment Pain Assessment: 0-10 Pain Score: 6  Pain Type: Acute pain Pain Location: Back Pain Orientation: Lower Pain Descriptors / Indicators: Aching Pain Frequency: Intermittent Pain Onset: On-going Patients Stated Pain Goal: 2 Pain Intervention(s): Medication (See eMAR);Repositioned Multiple Pain Sites: No   See Function Navigator for Current Functional Status.   Therapy/Group: Individual Therapy  Luberta Mutter 07/13/2016, 8:53 AM

## 2016-07-14 ENCOUNTER — Inpatient Hospital Stay (HOSPITAL_COMMUNITY): Payer: PPO | Admitting: Physical Therapy

## 2016-07-14 ENCOUNTER — Inpatient Hospital Stay (HOSPITAL_COMMUNITY): Payer: PPO | Admitting: Occupational Therapy

## 2016-07-14 DIAGNOSIS — M5416 Radiculopathy, lumbar region: Secondary | ICD-10-CM | POA: Diagnosis not present

## 2016-07-14 DIAGNOSIS — Z23 Encounter for immunization: Secondary | ICD-10-CM | POA: Diagnosis not present

## 2016-07-14 LAB — GLUCOSE, CAPILLARY
GLUCOSE-CAPILLARY: 120 mg/dL — AB (ref 65–99)
Glucose-Capillary: 97 mg/dL (ref 65–99)
Glucose-Capillary: 123 mg/dL — ABNORMAL HIGH (ref 65–99)

## 2016-07-14 MED ORDER — FAMOTIDINE 10 MG PO TABS: 10.0000 mg | ORAL_TABLET | Freq: Two times a day (BID) | ORAL | 0 refills | Status: AC

## 2016-07-14 MED ORDER — OXYCODONE-ACETAMINOPHEN 5-325 MG PO TABS: 1.0000 | ORAL_TABLET | Freq: Four times a day (QID) | ORAL | 0 refills | Status: AC | PRN

## 2016-07-14 NOTE — Discharge Instructions (Signed)
Inpatient Rehab Discharge Instructions  Carl Best Discharge date and time:    Activities/Precautions/ Functional Status: Activity: no lifting, driving, or strenuous exercise for till cleared by MD Diet: diabetic diet Wound Care: Paint incision with betadine and cover with dry dressing twice a day.  Contact MD if you develop any problems with your incision/wound--redness, swelling, increase in pain, increase in drainage or if you develop fever or chills.    Functional status:  ___ No restrictions     ___ Walk up steps independently _X__ 24/7 supervision/assistance   ___ Walk up steps with assistance ___ Intermittent supervision/assistance  ___ Bathe/dress independently ___ Walk with walker     _X__ Bathe/dress with assistance ___ Walk Independently    ___ Shower independently ___ Walk with assistance    ___ Shower with assistance _X__ No alcohol     ___ Return to work/school ________    COMMUNITY REFERRALS UPON DISCHARGE:    Home Health:   PT     RN                       Agency:  [facility]       Phone:  [phone]  Medical Equipment/Items Ordered: wheelchair, cushion and bedside commode                                                     Agency/Supplier:  [employer] @ [phone]   Special Instructions: 1. No bending, twisting or arching. Do not lift anything over 5 lbs. 2.  Wear back brace when at edge of bed or out of bed.    My questions have been answered and I understand these instructions. I will adhere to these goals and the provided educational materials after my discharge from the hospital.  Patient/Caregiver Signature _______________________________ Date __________  Clinician Signature _______________________________________ Date __________  Please bring this form and your medication list with you to all your follow-up doctor's appointments.

## 2016-07-14 NOTE — Progress Notes (Signed)
Pt. Got d/c papers and follow up appointments,pt. Ready to go home with her husband.

## 2016-07-14 NOTE — Discharge Summary (Signed)
Physician Discharge Summary  Patient ID: Karen Reynolds MRN: [id number] DOB/AGE: [birth year] 65 y.o.  Admit date: 07/07/2016 Discharge date: 07/14/2016  Discharge Diagnoses:  Principal Problem:   Lumbar radiculopathy Active Problems:   Spondylolisthesis of lumbar region   Peripheral neuropathy (HCC)   Lumbar stenosis   Essential hypertension   Discharged Condition: stable   Significant Diagnostic Studies:   Labs:  Basic Metabolic Panel:  Recent Labs Lab 07/08/16 0426  NA 139  K 3.5  CL 102  CO2 27  GLUCOSE 137*  BUN 12  CREATININE 1.31*  CALCIUM 8.1*    CBC:  Recent Labs Lab 07/08/16 0426 07/10/16 0443 07/12/16 0513  WBC 7.4 6.5 8.8  NEUTROABS 5.0  --   --   HGB 7.0* 6.9* 10.5*  HCT 23.1* 22.3* 34.2*  MCV 88.2 87.1 87.9  PLT 275 302 434*    CBG:  Recent Labs Lab 07/13/16 1133 07/13/16 1627 07/13/16 2019 07/14/16 0616 07/14/16 1156  GLUCAP 152* 96 97 97 123*    Brief HPI:   Karen A Johnsonis a 65 y.o.femalewith history of T2DM, gout, lumbar decompression with fusion L4-S1, multiple mononeuropathies, LBP with radiculopathy and progressive foot drop due to 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 She elected to undergo L2/3 and L3/4 decompressive lam on 09/11/17by Dr. Ellene Route. Post op with ABLA with drop in H/H to 7.2 as well as  hypotension.  She was started on cipro 9/14 due to complaints of dysuria. Therapy ongoing and patient limited by pain in hips as well as weakness.  CIR recommended for follow up therapy.     Hospital Course: GLADY OUTTEN was admitted to rehab 07/07/2016 for inpatient therapies to consist of PT and OT at least three hours five days a week. Past admission physiatrist, therapy team and rehab RN have worked together to provide customized collaborative inpatient rehab. Follow up labs showed further drop in H/H and she was transfused with 2 units PRBC with improvement in Hgb to 10.5. Blood pressures have been stable and energy  levels have improved. She is continent of bowel and bladder. Pain has been controlled with prn use of oxycodone and she was educated on tapering this after discharge. Back incision is intact and healing well. She did develop min serous drainage after staples were removed and NS recommended painting incision with betadine and bid dry dressing changes.  She was educated on use of her carbon AFO and is tolerating them. She has progressed to modified independent level but continues to be limited by decreased endurance She will continue to receive follow up HHPT and [place] for wound monitoring by [place] after discharge.    Rehab course: During patient's stay in rehab weekly team conferences were held to monitor patient's progress, set goals and discuss barriers to discharge. At admission, patient required mod assist with basic self care tasks and min assist with mobility. She has had improvement in activity tolerance, balance, postural control, as well as ability to compensate for deficits. She is modified independent for transfers and to ambulate 57' with RW. She is able to complete ADL tasks with AE and increased time.  Family education was completed with husband.      Disposition:  Home   Diet: Diabetic diet.   Special Instructions: 1. Continue back precautions.  Wear LSO when out of bed. 2. Paint back incision with betadine and cover with dry dressing bid.  3. No driving or strenuous activity.     Discharge Instructions  Ambulatory referral to Physical Medicine Rehab    Complete by:  As directed    4 week follow up       Medication List    STOP taking these medications   ibuprofen 200 MG tablet Commonly known as:  ADVIL,MOTRIN   meloxicam 15 MG tablet Commonly known as:  MOBIC     TAKE these medications   allopurinol 300 MG tablet Commonly known as:  ZYLOPRIM Take 300 mg by mouth daily.   ALPHA LIPOIC ACID PO Take 1 tablet by mouth daily.   amLODipine 10 MG  tablet Commonly known as:  NORVASC Take 10 mg by mouth daily.   aspirin EC 81 MG tablet Take 81 mg by mouth daily.   atorvastatin 10 MG tablet Commonly known as:  LIPITOR Take 10 mg by mouth daily.   famotidine 10 MG tablet Commonly known as:  PEPCID Take 1 tablet (10 mg total) by mouth 2 (two) times daily.   Fish Oil 1000 MG Caps Take 3,000 mg by mouth daily.   hydrochlorothiazide 25 MG tablet Commonly known as:  HYDRODIURIL Take 25 mg by mouth daily.   metFORMIN 1000 MG tablet Commonly known as:  GLUCOPHAGE Take 1,000 mg by mouth 2 (two) times daily.   metoprolol 100 MG tablet Commonly known as:  LOPRESSOR Take 50 mg by mouth 2 (two) times daily.   multivitamin capsule Take 1 capsule by mouth daily.   omeprazole 40 MG capsule Commonly known as:  PRILOSEC Take 40 mg by mouth daily.   oxyCODONE-acetaminophen 5-325 MG tablet--Rx # 45 pills Commonly known as:  PERCOCET/ROXICET Take 1-2 tablets by mouth every 6 (six) hours as needed for severe pain.   SALONPAS PAIN RELIEF PATCH Pads Apply 1 patch topically daily as needed (for pain).   TURMERIC PO Take 1 tablet by mouth daily.   valACYclovir 1000 MG tablet Commonly known as:  VALTREX Take 1,000 mg by mouth daily as needed (for fever blister flare ups).   Vitamin D (Cholecalciferol) 1000 units Tabs Take 1,000 Units by mouth daily.      Follow-up Information    Meredith Staggers, MD .   Specialty:  Physical Medicine and Rehabilitation Why:  Office will call you with follow up appointment Contact information: [address] [place] [address] [phone]        Earleen Newport, MD. Call today.   Specialty:  Neurosurgery Why:  for post op check Contact information: [address] [place] [address] [phone]        Myrlene Broker, MD Follow up on 07/25/2016.   Specialty:  Family Medicine Why:  @ 11:10 am (hospital follow up appointment) Contact  information: [place] [id number]           Signed: Bary Leriche 07/14/2016, 4:23 PM

## 2016-07-14 NOTE — Progress Notes (Signed)
Physical Therapy Discharge Summary  Patient Details  Name: Karen Reynolds MRN: [id number] Date of Birth: [birth year]  Today's Date: 07/14/2016 PT Individual Time: 0800-0900 and 1330-1400 PT Individual Time Calculation (min): 60 min and 30 min (total 90 min)     Patient has met 5 of 6 long term goals due to improved activity tolerance, improved balance, improved postural control, increased strength, decreased pain and functional use of  right lower extremity and left lower extremity.  Patient to discharge at an ambulatory level Modified Independent.   Patient's care partner is independent to provide the necessary physical assistance at discharge.  Reasons goals not met: Ambulation distance unmet by approximately 10'; performance adequate for home mobility requirements.   Recommendation:  Patient will benefit from ongoing skilled PT services in home health setting to continue to advance safe functional mobility, address ongoing impairments in strength, endurance, coordination, balance, and minimize fall risk.  Equipment: W/c  Reasons for discharge: treatment goals met and discharge from hospital  Patient/family agrees with progress made and goals achieved: Yes  PT Discharge Precautions/RestrictionsPrecautions Precautions: Back Precaution Comments: pt able to recall 3/3 precautions Required Braces or Orthoses: Spinal Brace Spinal Brace: Lumbar corset;Applied in sitting position Restrictions Weight Bearing Restrictions: No Vital Signs Therapy Vitals Temp: 97.8 F (36.6 C) Temp Source: Oral Pulse Rate: 70 Resp: 18 BP: 125/67 Patient Position (if appropriate): Lying Pain Pain Assessment Pain Assessment: 0-10 Pain Score: 6  Pain Type: Acute pain Pain Location: Back Pain Orientation: Lower Pain Descriptors / Indicators: Aching Pain Frequency: Intermittent Pain Onset: On-going Patients Stated Pain Goal: 2 Pain Intervention(s): Medication (See eMAR);Repositioned Multiple Pain  Sites: No Vision/Perception    WFL Cognition Overall Cognitive Status: Within Functional Limits for tasks assessed Arousal/Alertness: Awake/alert Orientation Level: Oriented X4 Memory: Appears intact Awareness: Appears intact Problem Solving: Appears intact Safety/Judgment: Appears intact Sensation Sensation Light Touch: Appears Intact Stereognosis: Not tested Hot/Cold: Not tested Proprioception: Appears Intact Coordination Gross Motor Movements are Fluid and Coordinated: Yes Fine Motor Movements are Fluid and Coordinated: Yes Heel Shin Test: impaired d/t BLE strength deficits Motor  Motor Motor: Within Functional Limits Motor - Discharge Observations: generalized LE weakness, BLE foot drop  Mobility Bed Mobility Bed Mobility: Supine to Sit;Sit to Supine;Rolling Right;Rolling Left Rolling Right: 6: Modified independent (Device/Increase time) Rolling Left: 6: Modified independent (Device/Increase time) Supine to Sit: 6: Modified independent (Device/Increase time) Sit to Supine: 6: Modified independent (Device/Increase time) Transfers Transfers: Yes Sit to Stand: 6: Modified independent (Device/Increase time);With armrests Stand Pivot Transfers: 6: Modified independent (Device/Increase time);With armrests Locomotion  Ambulation Ambulation: Yes Ambulation/Gait Assistance: 6: Modified independent (Device/Increase time) Ambulation Distance (Feet): 50 Feet Assistive device: Rolling walker Gait Gait: Yes Gait Pattern: Impaired Gait Pattern: Poor foot clearance - left;Poor foot clearance - right Gait velocity: 1.1 ft/sec Stairs / Additional Locomotion Stairs: No (declined) Ramp: 5: Supervision Curb: 5: Psychiatric nurse: Yes Wheelchair Assistance: 5: Investment banker, operational Details: Verbal cues for Marketing executive: Both upper extremities Wheelchair Parts Management: Needs assistance Distance: 150   Trunk/Postural Assessment  Cervical Assessment Cervical Assessment: Within Functional Limits Thoracic Assessment Thoracic Assessment: Within Functional Limits (spinal precautions, TLSO) Lumbar Assessment Lumbar Assessment: Exceptions to [facility] (decreased lumbar lordosis) Postural Control Postural Control: Within Functional Limits  Balance Balance Balance Assessed: Yes Dynamic Sitting Balance Dynamic Sitting - Balance Support: No upper extremity supported;Feet supported Dynamic Sitting - Level of Assistance: 6: Modified independent (Device/Increase time) Dynamic Sitting - Balance Activities: Lateral lean/weight shifting;Forward lean/weight shifting  Static Standing Balance Static Standing - Balance Support: During functional activity;No upper extremity supported Static Standing - Level of Assistance: 6: Modified independent (Device/Increase time) Dynamic Standing Balance Dynamic Standing - Balance Support: During functional activity;Right upper extremity supported;Left upper extremity supported;No upper extremity supported Dynamic Standing - Level of Assistance: 6: Modified independent (Device/Increase time) Dynamic Standing - Balance Activities: Lateral lean/weight shifting;Forward lean/weight shifting Extremity Assessment  RUE Assessment RUE Assessment: Within Functional Limits LUE Assessment LUE Assessment: Within Functional Limits RLE Assessment RLE Assessment: Exceptions to [facility] RLE Strength RLE Overall Strength: Deficits (hip flexion 4-/5, knee extension 4/5, knee flexion 4-/5, ankle dorsiflexion 0/5) LLE Assessment LLE Assessment: Exceptions to [facility] LLE Strength LLE Overall Strength:  (Hip flexion 4-/5, knee flexion 4-/5, knee extension 4/5, ankle dorsiflexion 0/5)  Skilled Therapeutic Intervention: Tx 1: Pt received supine in bed, c/o pain as above and agreeable to treatment. Supine>sit with modI. Donned B shoes and AFOs with maxA. Gait in/out of bathroom with RW and modI.  Required min assist for toileting for peri hygiene; clothing management without assist. Seated at sink d/t fatigue, pt performed grooming modI. Assessed all mobility as described below with modI overall using RW and rest breaks as needed, including car transfer, bed mobility on flat bed, gait x50' with turns, transfers. Required S for gait on ramp and uneven surface. Nustep x8 min with BUE/BLE level 5 with average 45 steps/min. Returned to room in w/c totalA. Stand pivot transfer w/c>recliner with RW and modI. Remained seated in recliner at end of session, all needs in reach.   Tx 2: Pt received seated in recliner, c/o pain as above and agreeable to treatment. Sit <>stand 2x7 reps with min cues for hand placement. Educated pt in home safety including lights on at night when getting up, clear walk ways and clutter off the floor. Pt verbalizes understanding with no further questions regarding d/c home. Pt remained seated in recliner at end of session, all needs in reach.   See Function Navigator for Current Functional Status.  Benjiman Core Tygielski 07/14/2016, 8:46 AM

## 2016-07-14 NOTE — Progress Notes (Signed)
Occupational Therapy Session Note  Patient Details  Name: Karen Reynolds MRN: [id number] Date of Birth: April 15, 1951  Today's Date: 07/14/2016 OT Individual Time: 1030-1200 OT Individual Time Calculation (min): 90 min     Short Term Goals:Week 1:  OT Short Term Goal 1 (Week 1): STGs=LTGs secondary to ELOS  Skilled Therapeutic Interventions/Progress Updates:    Pt seen for OT ADL bathing/dressing session. Pt sitting up in recliner upon arrival, agreeable to tx session. She ambulated throughout sessoin and completed functional transfers with supervision.  She ambulated to gather clothing in prep for shower, requiring assist to gather low items in order to maintain spinal precautions. Pt unwilling to attempt mini squat in order to try to reach. Seated rest break required before ambulating into bathroom. She bathed seated on shower chair with distant supervision using LH sponge. She dressed using reacher and assist provided for shoes and TLSO.   She ambulated throughout room to begin picking up items in prep for d/c home today. She was able to ambulate to location, however, required to sit in order to complete tasks due to fatigue. She required cues throughout for maintaining spinal precautions (pt twisting) and using reacher.   Discussed with pt asking surgeon if she can be cleared to complete stand pivot to [facility] for night time use- pt called during session and left message. Pt returned to recliner at end of session, all needs in reach awaiting lunch. Discussed throughout session regarding energy conservation, incorporating exercises into everyday tasks, importance of participation and independence with ADLs, and d/c planning.   Therapy Documentation Precautions:  Precautions Precautions: Back Precaution Booklet Issued: No Precaution Comments: pt able to recall 3/3 precautions Required Braces or Orthoses: Spinal Brace Spinal Brace: Lumbar corset, Applied in sitting position Restrictions Weight  Bearing Restrictions: No Pain:   No/ denies pain  See Function Navigator for Current Functional Status.   Therapy/Group: Individual Therapy  Lewis, Magnum Lunde C 07/14/2016, 7:20 AM

## 2016-07-14 NOTE — Progress Notes (Signed)
Physical Therapy Session Note  Patient Details  Name: Karen Reynolds MRN: [id number] Date of Birth: [birth year]  Today's Date: 07/14/2016 PT Individual Time: 0930-1000 PT Individual Time Calculation (min): 30 min    Short Term Goals: Week 1:  PT Short Term Goal 1 (Week 1): STG=LTG  Skilled Therapeutic Interventions/Progress Updates:     Patient received sitting in Recliner and agreeable to PT.   Patient performed stand pivot to and from recliner throughout therapy without cues or assistance but required increased time and effort for safety.   PT transported patient to rehab gym in [place] for time management.  Dynamic balance training as well as standing tolerance with BUE support on RW on Wii fit for tilt table game with 3 bouts x 90 seconds requiring rest break after each bout. Dynamic balance with 1 UE support to play Wii sports bowling x 5 frames; patient initiated forward and rotational pertubation was was required to prevent LOB with each bowl. Sit<>stand performed using BUE support and RW with each frame without cues or assist from PT.   Patient returned to room and left sitting in recliner with call bell in reach at end of session.   Therapy Documentation Precautions:  Precautions Precautions: Back Precaution Booklet Issued: No Precaution Comments: pt able to recall 3/3 precautions Required Braces or Orthoses: Spinal Brace Spinal Brace: Lumbar corset, Applied in sitting position Restrictions Weight Bearing Restrictions: No General:   See Function Navigator for Current Functional Status.   Therapy/Group: Individual Therapy  Lorie Phenix 07/14/2016, 5:42 PM

## 2016-07-14 NOTE — Progress Notes (Signed)
Occupational Therapy Discharge Summary  Patient Details  Name: Karen Reynolds MRN: [id number] Date of Birth: 07/09/1951   Patient has met 7 of 7 long term goals due to improved activity tolerance, improved balance and postural control.  Patient to discharge at [facility] Supervision- [place] level.  Patient's care partner is independent to provide the necessary physical assistance at discharge.  Patient requiring increased assist for ADL tasks, however, pt stating husband will help with all needs and not entirely open to trying AE for increased independence with ADL dressing/ toileting tasks.     Recommendation:  Patient with no further OT needs at this time.   Equipment: Bariatric [facility]  Reasons for discharge: treatment goals met and discharge from hospital  Patient/family agrees with progress made and goals achieved: Yes  OT Discharge Precautions/Restrictions  Precautions Precautions: Back Required Braces or Orthoses: Spinal Brace Spinal Brace: Lumbar corset;Applied in sitting position Restrictions Weight Bearing Restrictions: No Vision/Perception  Vision- History Baseline Vision/History: Wears glasses Wears Glasses: Reading only Patient Visual Report: No change from baseline Vision- Assessment Vision Assessment?: No apparent visual deficits  Cognition Overall Cognitive Status: Within Functional Limits for tasks assessed Arousal/Alertness: Awake/alert Orientation Level: Oriented X4 Memory: Appears intact Awareness: Appears intact Problem Solving: Appears intact Safety/Judgment: Appears intact Sensation Sensation Light Touch: Appears Intact Proprioception: Appears Intact Coordination Gross Motor Movements are Fluid and Coordinated: Yes Fine Motor Movements are Fluid and Coordinated: Yes Motor  Motor Motor: Within Functional Limits Trunk/Postural Assessment  Cervical Assessment Cervical Assessment: Within Functional Limits Thoracic Assessment Thoracic Assessment:  Exceptions to [facility] (spinal pre-cautions; TLSO) Lumbar Assessment Lumbar Assessment: Exceptions to [facility] (Spinal pre-cautions; TLSO) Postural Control Postural Control: Within Functional Limits  Balance Balance Balance Assessed: Yes Static Standing Balance Static Standing - Balance Support: During functional activity Static Standing - Level of Assistance: 5: Stand by assistance Dynamic Standing Balance Dynamic Standing - Balance Support: During functional activity;Right upper extremity supported;Left upper extremity supported;No upper extremity supported Dynamic Standing - Level of Assistance: 5: Stand by assistance Extremity/Trunk Assessment RUE Assessment RUE Assessment: Within Functional Limits LUE Assessment LUE Assessment: Within Functional Limits   See Function Navigator for Current Functional Status.  Bobby Rumpf, Karen Reynolds 07/14/2016, 12:58 PM

## 2016-07-14 NOTE — Progress Notes (Signed)
Patient ID: Karen Reynolds, female   DOB: May 16, 1951, 65 y.o.   MRN: [id number]  Subjective/Complaints: Feeling well. Likes braces! Would like to go home at the end of the day tomorrow  Discussed keeping IV in until we are sure she does not need a transfusion ROS no CP, SOB, N/V/D Objective: Vital Signs: Blood pressure 125/67, pulse 70, temperature 97.8 F (36.6 C), temperature source Oral, resp. rate 18, height 5\' 5"  (1.651 m), weight 102.3 kg (225 lb 8 oz), SpO2 97 %. No results found. Results for orders placed or performed during the hospital encounter of 07/07/16 (from the past 72 hour(s))  Glucose, capillary     Status: Abnormal   Collection Time: 07/11/16 11:40 AM  Result Value Ref Range   Glucose-Capillary 137 (H) 65 - 99 mg/dL  Glucose, capillary     Status: Abnormal   Collection Time: 07/11/16  4:46 PM  Result Value Ref Range   Glucose-Capillary 118 (H) 65 - 99 mg/dL  Glucose, capillary     Status: Abnormal   Collection Time: 07/11/16  9:04 PM  Result Value Ref Range   Glucose-Capillary 115 (H) 65 - 99 mg/dL  CBC     Status: Abnormal   Collection Time: 07/12/16  5:13 AM  Result Value Ref Range   WBC 8.8 4.0 - 10.5 K/uL   RBC 3.89 3.87 - 5.11 MIL/uL   Hemoglobin 10.5 (L) 12.0 - 15.0 g/dL   HCT 34.2 (L) 36.0 - 46.0 %   MCV 87.9 78.0 - 100.0 fL   MCH 27.0 26.0 - 34.0 pg   MCHC 30.7 30.0 - 36.0 g/dL   RDW 15.5 11.5 - 15.5 %   Platelets 434 (H) 150 - 400 K/uL  Glucose, capillary     Status: Abnormal   Collection Time: 07/12/16  6:45 AM  Result Value Ref Range   Glucose-Capillary 116 (H) 65 - 99 mg/dL  Glucose, capillary     Status: Abnormal   Collection Time: 07/12/16 11:39 AM  Result Value Ref Range   Glucose-Capillary 136 (H) 65 - 99 mg/dL  Glucose, capillary     Status: None   Collection Time: 07/12/16  4:33 PM  Result Value Ref Range   Glucose-Capillary 99 65 - 99 mg/dL  Glucose, capillary     Status: Abnormal   Collection Time: 07/12/16  8:53 PM  Result Value  Ref Range   Glucose-Capillary 110 (H) 65 - 99 mg/dL  Glucose, capillary     Status: Abnormal   Collection Time: 07/13/16  6:38 AM  Result Value Ref Range   Glucose-Capillary 105 (H) 65 - 99 mg/dL  Glucose, capillary     Status: Abnormal   Collection Time: 07/13/16 11:33 AM  Result Value Ref Range   Glucose-Capillary 152 (H) 65 - 99 mg/dL  Glucose, capillary     Status: None   Collection Time: 07/13/16  4:27 PM  Result Value Ref Range   Glucose-Capillary 96 65 - 99 mg/dL  Glucose, capillary     Status: None   Collection Time: 07/13/16  8:19 PM  Result Value Ref Range   Glucose-Capillary 97 65 - 99 mg/dL  Glucose, capillary     Status: None   Collection Time: 07/14/16  6:16 AM  Result Value Ref Range   Glucose-Capillary 97 65 - 99 mg/dL     HEENT: normal Cardio: RRR Resp: CTA B/L and unlabored GI: CTA B/L and unlabored Extremity:  No Edema Skin:   Other lumbar incision small amt  dried blood on honeycomb dressing--stable---no further drainage noted Neuro: Alert/Oriented, Abnormal Sensory reduced bilateral feet and Abnormal Motor o/5 bilateral ankle DF, 3- Left HF, KE, 4- R HF, KE---no change Musc/Skel:  Heel cords are tight Gen NAD   Assessment/Plan: 1. Functional deficits secondary to Lumbar stenosis with multilevel radiculopathies which require 3+ hours per day of interdisciplinary therapy in a comprehensive inpatient rehab setting. Physiatrist is providing close team supervision and 24 hour management of active medical problems listed below. Physiatrist and rehab team continue to assess barriers to discharge/monitor patient progress toward functional and medical goals. FIM: Function - Bathing Position: Shower Body parts bathed by patient: Right arm, Left arm, Chest, Abdomen, Front perineal area, Buttocks, Right upper leg, Left upper leg, Right lower leg, Left lower leg Body parts bathed by helper: Back Bathing not applicable: Right upper leg, Left upper leg, Right lower  leg, Left lower leg Assist Level: Supervision or verbal cues  Function- Upper Body Dressing/Undressing What is the patient wearing?: Pull over shirt/dress, Orthosis Pull over shirt/dress - Perfomed by patient: Put head through opening, Thread/unthread right sleeve, Thread/unthread left sleeve, Pull shirt over trunk Orthosis activity level: Performed by patient Assist Level: Set up Set up : To obtain clothing/put away Function - Lower Body Dressing/Undressing What is the patient wearing?: Pants, Socks, Shoes, AFO Position: Sitting EOB Pants- Performed by patient: Thread/unthread right pants leg, Thread/unthread left pants leg, Pull pants up/down Pants- Performed by helper: Thread/unthread right pants leg, Thread/unthread left pants leg Socks - Performed by patient: Don/doff right sock, Don/doff left sock Socks - Performed by helper: Don/doff right sock, Don/doff left sock Shoes - Performed by patient: Don/doff right shoe, Don/doff left shoe (Elastic shoe laces) Shoes - Performed by helper: Don/doff right shoe, Don/doff left shoe (elastic laces) AFO - Performed by helper: Don/doff right AFO, Don/doff left AFO Assist for footwear: Partial/moderate assist Assist for lower body dressing: Supervision or verbal cues Management consultant)  Function - Toileting Toileting steps completed by patient: Adjust clothing prior to toileting, Adjust clothing after toileting Toileting steps completed by helper: Performs perineal hygiene Toileting Assistive Devices: Grab bar or rail Assist level: Touching or steadying assistance (Pt.75%)  Function - Air cabin crew transfer assistive device: Grab bar Assist level to toilet: Touching or steadying assistance (Pt > 75%) Assist level from toilet: Touching or steadying assistance (Pt > 75%)  Function - Chair/bed transfer Chair/bed transfer method: Stand pivot Chair/bed transfer assist level: No Help, no cues, assistive device, takes more than a reasonable  amount of time Chair/bed transfer assistive device: Armrests, Walker Chair/bed transfer details: Verbal cues for precautions/safety  Function - Locomotion: Wheelchair Will patient use wheelchair at discharge?: Yes Type: Manual Max wheelchair distance: 150 Assist Level: Supervision or verbal cues Assist Level: Supervision or verbal cues Wheel 150 feet activity did not occur: Safety/medical concerns Assist Level: Supervision or verbal cues Turns around,maneuvers to table,bed, and toilet,negotiates 3% grade,maneuvers on rugs and over doorsills: No Function - Locomotion: Ambulation Assistive device: Walker-rolling Max distance: 50 Assist level: No help, No cues, assistive device, takes more than a reasonable amount of time Assist level: No help, No cues, assistive device, takes more than a reasonable amount of time Walk 50 feet with 2 turns activity did not occur: Safety/medical concerns Assist level: No help, No cues, assistive device, takes more than a reasonable amount of time Walk 150 feet activity did not occur: Safety/medical concerns (fatigue) Walk 10 feet on uneven surfaces activity did not occur: Safety/medical concerns Assist level:  Supervision or verbal cues  Function - Comprehension Comprehension: Auditory Comprehension assist level: Follows complex conversation/direction with no assist  Function - Expression Expression: Verbal Expression assist level: Expresses complex ideas: With no assist  Function - Social Interaction Social Interaction assist level: Interacts appropriately with others - No medications needed.  Function - Problem Solving Problem solving assist level: Solves complex problems: Recognizes & self-corrects  Function - Memory Memory assist level: Complete Independence: No helper Patient normally able to recall (first 3 days only): Current season    Medical Problem List and Plan: 1.  Functional and mobility deficits secondary to lumbar spondylosis  with stenosis and radiculopathies              -PT, OT continue             -using carbon AFO's. Feels that they are easier to put on now--willing to use at home  - home tomorrow  -follow up with me in 4 weeks at office 2.  DVT Prophylaxis/Anticoagulation: Mechanical: Sequential compression devices, below knee Bilateral lower extremities 3. Pain Management: continue oxycodone prn 4. Mood: LCSW to follow for evaluation and sup 5. Neuropsych: This patient is capable of making decisions on his own behalf. 6. Skin/Wound Care: Monitor wound for healing.               -superior portion of wound now dry  -changed to dry dressing---superior area still with small amount of drainage  7. Fluids/Electrolytes/Nutrition: Monitor I/O. Offer supplements 8. HTN: Monitor BP bid and titrate medications as indicated. Continue Norvasc, HCTZ and metoprolol with parameters. Vitals:   07/13/16 2000 07/14/16 0551  BP: 126/60 125/67  Pulse: 81 70  Resp:  18  Temp:  97.8 F (36.6 C)   9. T2DM: Monitor BS ac/hs. Continue metformin bid.   -improved control CBG (last 3)   Recent Labs  07/13/16 1627 07/13/16 2019 07/14/16 0616  GLUCAP 96 97 97    10. Dysuria:cipro completed 11. ABLA:     -receceived 2u PRBC's  -re-check hgb 10.5  12. H/o Gout: On allopurinol. Monitor for flare up. 13. Diarrhea: improved after stopping laxatives.  14. Dyspepsia:  pepcid to help manage symptoms.    LOS (Days) 7 A FACE TO FACE EVALUATION WAS PERFORMED  SWARTZ,ZACHARY T 07/14/2016, 9:08 AM

## 2016-07-17 NOTE — Progress Notes (Signed)
Social Work  Discharge Note  The overall goal for the admission was met for:   Discharge location: Yes - home with spouse able to provide assist  Length of Stay: Yes - 7 days  Discharge activity level: Yes - modified independent  Home/community participation: Yes  Services provided included: MD, RD, PT, OT, RN, Pharmacy and SW  Financial Services: Other: [employer]  Follow-up services arranged: Home Health: RN, PT via [facility], DME: 20x18 lightweight w/c, cushion, wide 3n1 via [place] and Patient/Family has no preference for HH/DME agencies  Comments (or additional information):  Patient/Family verbalized understanding of follow-up arrangements: Yes  Individual responsible for coordination of the follow-up plan: pt  Confirmed correct DME delivered: Armando Bukhari 07/17/2016    Glorine Hanratty

## 2016-07-18 DIAGNOSIS — Z96622 Presence of left artificial elbow joint: Secondary | ICD-10-CM | POA: Diagnosis not present

## 2016-07-18 DIAGNOSIS — M1990 Unspecified osteoarthritis, unspecified site: Secondary | ICD-10-CM | POA: Diagnosis not present

## 2016-07-18 DIAGNOSIS — E1122 Type 2 diabetes mellitus with diabetic chronic kidney disease: Secondary | ICD-10-CM | POA: Diagnosis not present

## 2016-07-18 DIAGNOSIS — M109 Gout, unspecified: Secondary | ICD-10-CM | POA: Diagnosis not present

## 2016-07-18 DIAGNOSIS — Z4789 Encounter for other orthopedic aftercare: Secondary | ICD-10-CM | POA: Diagnosis not present

## 2016-07-18 DIAGNOSIS — Z79891 Long term (current) use of opiate analgesic: Secondary | ICD-10-CM | POA: Diagnosis not present

## 2016-07-18 DIAGNOSIS — I129 Hypertensive chronic kidney disease with stage 1 through stage 4 chronic kidney disease, or unspecified chronic kidney disease: Secondary | ICD-10-CM | POA: Diagnosis not present

## 2016-07-18 DIAGNOSIS — M21372 Foot drop, left foot: Secondary | ICD-10-CM | POA: Diagnosis not present

## 2016-07-18 DIAGNOSIS — E669 Obesity, unspecified: Secondary | ICD-10-CM | POA: Diagnosis not present

## 2016-07-18 DIAGNOSIS — E1142 Type 2 diabetes mellitus with diabetic polyneuropathy: Secondary | ICD-10-CM | POA: Diagnosis not present

## 2016-07-18 DIAGNOSIS — N189 Chronic kidney disease, unspecified: Secondary | ICD-10-CM | POA: Diagnosis not present

## 2016-07-18 DIAGNOSIS — Z7984 Long term (current) use of oral hypoglycemic drugs: Secondary | ICD-10-CM | POA: Diagnosis not present

## 2016-07-18 DIAGNOSIS — Z96653 Presence of artificial knee joint, bilateral: Secondary | ICD-10-CM | POA: Diagnosis not present

## 2016-07-18 DIAGNOSIS — M4306 Spondylolysis, lumbar region: Secondary | ICD-10-CM | POA: Diagnosis not present

## 2016-07-18 DIAGNOSIS — M21371 Foot drop, right foot: Secondary | ICD-10-CM | POA: Diagnosis not present

## 2016-07-25 DIAGNOSIS — E1165 Type 2 diabetes mellitus with hyperglycemia: Secondary | ICD-10-CM | POA: Diagnosis not present

## 2016-07-25 DIAGNOSIS — E782 Mixed hyperlipidemia: Secondary | ICD-10-CM | POA: Diagnosis not present

## 2016-07-25 DIAGNOSIS — I10 Essential (primary) hypertension: Secondary | ICD-10-CM | POA: Diagnosis not present

## 2016-07-25 DIAGNOSIS — K219 Gastro-esophageal reflux disease without esophagitis: Secondary | ICD-10-CM | POA: Diagnosis not present

## 2016-07-25 DIAGNOSIS — M109 Gout, unspecified: Secondary | ICD-10-CM | POA: Diagnosis not present

## 2016-07-25 DIAGNOSIS — M1990 Unspecified osteoarthritis, unspecified site: Secondary | ICD-10-CM | POA: Diagnosis not present

## 2016-07-25 DIAGNOSIS — D649 Anemia, unspecified: Secondary | ICD-10-CM | POA: Diagnosis not present

## 2016-07-27 DIAGNOSIS — M4316 Spondylolisthesis, lumbar region: Secondary | ICD-10-CM | POA: Diagnosis not present

## 2016-07-29 DIAGNOSIS — Z1231 Encounter for screening mammogram for malignant neoplasm of breast: Secondary | ICD-10-CM | POA: Diagnosis not present

## 2016-08-09 DIAGNOSIS — R928 Other abnormal and inconclusive findings on diagnostic imaging of breast: Secondary | ICD-10-CM | POA: Diagnosis not present

## 2016-08-09 DIAGNOSIS — R921 Mammographic calcification found on diagnostic imaging of breast: Secondary | ICD-10-CM | POA: Diagnosis not present

## 2016-08-21 DIAGNOSIS — R928 Other abnormal and inconclusive findings on diagnostic imaging of breast: Secondary | ICD-10-CM | POA: Diagnosis not present

## 2016-08-21 DIAGNOSIS — D0512 Intraductal carcinoma in situ of left breast: Secondary | ICD-10-CM | POA: Diagnosis not present

## 2016-08-21 DIAGNOSIS — R920 Mammographic microcalcification found on diagnostic imaging of breast: Secondary | ICD-10-CM | POA: Diagnosis not present

## 2016-08-22 ENCOUNTER — Encounter (INDEPENDENT_AMBULATORY_CARE_PROVIDER_SITE_OTHER): Payer: Self-pay

## 2016-08-22 ENCOUNTER — Encounter: Payer: PPO | Attending: Physical Medicine & Rehabilitation | Admitting: Physical Medicine & Rehabilitation

## 2016-08-22 ENCOUNTER — Encounter: Payer: Self-pay | Admitting: Physical Medicine & Rehabilitation

## 2016-08-22 VITALS — BP 142/84 | HR 67 | Resp 16

## 2016-08-22 DIAGNOSIS — M21371 Foot drop, right foot: Secondary | ICD-10-CM | POA: Insufficient documentation

## 2016-08-22 DIAGNOSIS — G8929 Other chronic pain: Secondary | ICD-10-CM | POA: Insufficient documentation

## 2016-08-22 DIAGNOSIS — M4316 Spondylolisthesis, lumbar region: Secondary | ICD-10-CM | POA: Diagnosis not present

## 2016-08-22 DIAGNOSIS — E1140 Type 2 diabetes mellitus with diabetic neuropathy, unspecified: Secondary | ICD-10-CM | POA: Diagnosis not present

## 2016-08-22 DIAGNOSIS — K219 Gastro-esophageal reflux disease without esophagitis: Secondary | ICD-10-CM | POA: Insufficient documentation

## 2016-08-22 DIAGNOSIS — M21372 Foot drop, left foot: Secondary | ICD-10-CM | POA: Diagnosis not present

## 2016-08-22 DIAGNOSIS — M47816 Spondylosis without myelopathy or radiculopathy, lumbar region: Secondary | ICD-10-CM | POA: Insufficient documentation

## 2016-08-22 DIAGNOSIS — I10 Essential (primary) hypertension: Secondary | ICD-10-CM | POA: Insufficient documentation

## 2016-08-22 DIAGNOSIS — M5416 Radiculopathy, lumbar region: Secondary | ICD-10-CM

## 2016-08-22 DIAGNOSIS — Z8541 Personal history of malignant neoplasm of cervix uteri: Secondary | ICD-10-CM | POA: Insufficient documentation

## 2016-08-22 DIAGNOSIS — M109 Gout, unspecified: Secondary | ICD-10-CM | POA: Insufficient documentation

## 2016-08-22 DIAGNOSIS — E7800 Pure hypercholesterolemia, unspecified: Secondary | ICD-10-CM | POA: Diagnosis not present

## 2016-08-22 DIAGNOSIS — M5136 Other intervertebral disc degeneration, lumbar region: Secondary | ICD-10-CM | POA: Diagnosis not present

## 2016-08-22 NOTE — Patient Instructions (Signed)
PLEASE CALL ME WITH ANY PROBLEMS OR QUESTIONS ([phone])  

## 2016-08-22 NOTE — Progress Notes (Signed)
Subjective:    Patient ID: Karen Reynolds, female    DOB: 06/22/1951, 65 y.o.   MRN: [id number]  HPI   Mrs. Wohler is here in follow up of her rehab admission and recent spinal surgery. She is walking with her walker at home using her AFO's. She has gotten better with donning and doffing of her braces. She is walking around the house for short distances. Therapy is coming out to the house still. They plan to come out for about two more weeks.   Her pain is well controlled. Bowels and bladder are functioning well.    Pain Inventory Average Pain 3 Pain Right Now 3 My pain is aching  In the last 24 hours, has pain interfered with the following? General activity 4 Relation with others 3 Enjoyment of life 5 What TIME of day is your pain at its worst? varies Sleep (in general) Fair  Pain is worse with: walking, bending and standing Pain improves with: rest and medication Relief from Meds: not answered  Mobility use a walker ability to climb steps?  no do you drive?  no use a wheelchair transfers alone  Function not employed: date last employed . I need assistance with the following:  dressing, bathing, meal prep, household duties and shopping  Neuro/Psych weakness trouble walking  Prior Studies Any changes since last visit?  no  Physicians involved in your care Any changes since last visit?  no   Family History  Problem Relation Age of Onset  . Heart disease Mother   . Stroke Father   . Dementia Sister    Social History   Social History  . Marital status: Married    Spouse name: Lewis  . Number of children: 4  . Years of education: 12+   Occupational History  . Retired    Social History Main Topics  . Smoking status: Never Smoker  . Smokeless tobacco: Never Used  . Alcohol use No  . Drug use: No  . Sexual activity: Not Asked   Other Topics Concern  . None   Social History Narrative   Lives at home with her husband   Right-handed   No caffeine    Past Surgical History:  Procedure Laterality Date  . ABDOMINAL HYSTERECTOMY    . BACK SURGERY  1980   lumbar- L5-S1- laminectomy   . CESAREAN SECTION  1988  . CHOLECYSTECTOMY    . COLONOSCOPY    . COLONOSCOPY WITH ESOPHAGOGASTRODUODENOSCOPY (EGD)    . JOINT REPLACEMENT  3 &03/2015   bilateral knee  . left arm surgery     plates and screws  . STAPEDES SURGERY Bilateral   . TOTAL ELBOW REPLACEMENT Left    Past Medical History:  Diagnosis Date  . Arthritis    degenerative joint disease, lumbar region degeneration    . Bruises easily   . Cancer ([place]) 1989   cervical ca  . Cataract    immautre but not sure which eye  . Chronic back pain    stenosis  . Complication of anesthesia   . Diabetes mellitus without complication ([place])    takes Metformin daily  . Foot drop, bilateral   . GERD (gastroesophageal reflux disease)    takes Omeprazole daily  . Gout    takes Allopurinol daily  . High cholesterol    takes Atorvastatin daily  . Hypertension    takes Amlodipine,HCTZ, and Metoprolol daily  . Peripheral neuropathy ([place]) 03/09/2016  . Peripheral neuropathy ([place])   .  PONV (postoperative nausea and vomiting)   . Spinal headache    BP (!) 142/84   Pulse 67   Resp 16   SpO2 97%   Opioid Risk Score:   Fall Risk Score:  `1  Depression screen PHQ 2/9  Depression screen PHQ 2/9 08/22/2016  Decreased Interest 0  Down, Depressed, Hopeless 0  PHQ - 2 Score 0  Altered sleeping 0  Tired, decreased energy 0  Change in appetite 0  Feeling bad or failure about yourself  0  Trouble concentrating 0  Moving slowly or fidgety/restless 0  Suicidal thoughts 0  PHQ-9 Score 0    Review of Systems  All other systems reviewed and are negative.      Objective:   Physical Exam  HEENT: normal Cardio: RRR Resp: CTA B/L and unlabored GI: CTA B/L and unlabored Extremity:  No Edema Skin:   Other lumbar incision healed Neuro: Alert/Oriented, A Abnormal Motor o/5 bilateral ankle  DF, 3+ left HF, KE and 3 right HF,KE. APF bilaterally 3/5. Decreased sensation L4-5 distributions. dtr's 1+ ue,knees and absent at ankles Musc/Skel:  Heel cords are tight Gen NAD      Assessment & Plan:  1. Functional and mobility deficitssecondary to lumbar spondylosis with stenosis and radiculopathies  -PT, OT continue----make referral to outpt PT, aquatic based therapies -using carbon AFO's.   -needs to make a daily effort to work on strength and balance regardless of therapies.  Husband feels that she's not committed.   2. DVT Prophylaxis/Anticoagulation: Mechanical: Sequential compression devices, below knee Bilateral lower extremities 3. Pain Management: continue oxycodone prn---weaning to off. 4. Mood: LCSW to follow for evaluation and sup 5. Neuropsych: This patient iscapable of making decisions on hisown behalf. 6. Skin/Wound Care: back has healed 8. HTN: per primary  Fifteen minutes of face to face patient care time were spent during this visit. All questions were encouraged and answered.  Follow up with me in 3 months.

## 2016-08-25 DIAGNOSIS — I10 Essential (primary) hypertension: Secondary | ICD-10-CM | POA: Diagnosis not present

## 2016-09-04 DIAGNOSIS — Z6838 Body mass index (BMI) 38.0-38.9, adult: Secondary | ICD-10-CM | POA: Diagnosis not present

## 2016-09-04 DIAGNOSIS — D0512 Intraductal carcinoma in situ of left breast: Secondary | ICD-10-CM | POA: Diagnosis not present

## 2016-09-22 DIAGNOSIS — I129 Hypertensive chronic kidney disease with stage 1 through stage 4 chronic kidney disease, or unspecified chronic kidney disease: Secondary | ICD-10-CM | POA: Diagnosis not present

## 2016-09-22 DIAGNOSIS — E119 Type 2 diabetes mellitus without complications: Secondary | ICD-10-CM | POA: Diagnosis not present

## 2016-09-22 DIAGNOSIS — Z01810 Encounter for preprocedural cardiovascular examination: Secondary | ICD-10-CM | POA: Diagnosis not present

## 2016-09-22 DIAGNOSIS — E785 Hyperlipidemia, unspecified: Secondary | ICD-10-CM | POA: Diagnosis not present

## 2016-09-22 DIAGNOSIS — G43909 Migraine, unspecified, not intractable, without status migrainosus: Secondary | ICD-10-CM | POA: Diagnosis not present

## 2016-09-22 DIAGNOSIS — N189 Chronic kidney disease, unspecified: Secondary | ICD-10-CM | POA: Diagnosis not present

## 2016-09-22 DIAGNOSIS — E8881 Metabolic syndrome: Secondary | ICD-10-CM | POA: Diagnosis not present

## 2016-09-22 DIAGNOSIS — D0512 Intraductal carcinoma in situ of left breast: Secondary | ICD-10-CM | POA: Diagnosis not present

## 2016-09-22 DIAGNOSIS — Z6841 Body Mass Index (BMI) 40.0 and over, adult: Secondary | ICD-10-CM | POA: Diagnosis not present

## 2016-09-22 DIAGNOSIS — G4733 Obstructive sleep apnea (adult) (pediatric): Secondary | ICD-10-CM | POA: Diagnosis not present

## 2016-09-22 DIAGNOSIS — Z7984 Long term (current) use of oral hypoglycemic drugs: Secondary | ICD-10-CM | POA: Diagnosis not present

## 2016-09-22 DIAGNOSIS — K219 Gastro-esophageal reflux disease without esophagitis: Secondary | ICD-10-CM | POA: Diagnosis not present

## 2016-09-22 DIAGNOSIS — M1990 Unspecified osteoarthritis, unspecified site: Secondary | ICD-10-CM | POA: Diagnosis not present

## 2016-09-22 DIAGNOSIS — I2510 Atherosclerotic heart disease of native coronary artery without angina pectoris: Secondary | ICD-10-CM | POA: Diagnosis not present

## 2016-09-22 DIAGNOSIS — Z79899 Other long term (current) drug therapy: Secondary | ICD-10-CM | POA: Diagnosis not present

## 2016-09-22 DIAGNOSIS — R921 Mammographic calcification found on diagnostic imaging of breast: Secondary | ICD-10-CM | POA: Diagnosis not present

## 2016-09-22 DIAGNOSIS — E669 Obesity, unspecified: Secondary | ICD-10-CM | POA: Diagnosis not present

## 2016-09-22 DIAGNOSIS — M109 Gout, unspecified: Secondary | ICD-10-CM | POA: Diagnosis not present

## 2016-10-06 DIAGNOSIS — Z8541 Personal history of malignant neoplasm of cervix uteri: Secondary | ICD-10-CM | POA: Diagnosis not present

## 2016-10-06 DIAGNOSIS — Z170 Estrogen receptor positive status [ER+]: Secondary | ICD-10-CM | POA: Diagnosis not present

## 2016-10-06 DIAGNOSIS — D0512 Intraductal carcinoma in situ of left breast: Secondary | ICD-10-CM | POA: Diagnosis not present

## 2016-10-06 DIAGNOSIS — Z96653 Presence of artificial knee joint, bilateral: Secondary | ICD-10-CM | POA: Diagnosis not present

## 2016-10-06 DIAGNOSIS — E1142 Type 2 diabetes mellitus with diabetic polyneuropathy: Secondary | ICD-10-CM | POA: Diagnosis not present

## 2016-10-06 DIAGNOSIS — M48061 Spinal stenosis, lumbar region without neurogenic claudication: Secondary | ICD-10-CM | POA: Diagnosis not present

## 2016-10-06 DIAGNOSIS — K219 Gastro-esophageal reflux disease without esophagitis: Secondary | ICD-10-CM | POA: Diagnosis not present

## 2016-10-06 DIAGNOSIS — M8580 Other specified disorders of bone density and structure, unspecified site: Secondary | ICD-10-CM | POA: Diagnosis not present

## 2016-10-06 DIAGNOSIS — E1122 Type 2 diabetes mellitus with diabetic chronic kidney disease: Secondary | ICD-10-CM | POA: Diagnosis not present

## 2016-10-06 DIAGNOSIS — I129 Hypertensive chronic kidney disease with stage 1 through stage 4 chronic kidney disease, or unspecified chronic kidney disease: Secondary | ICD-10-CM | POA: Diagnosis not present

## 2016-10-06 DIAGNOSIS — M5137 Other intervertebral disc degeneration, lumbosacral region: Secondary | ICD-10-CM | POA: Diagnosis not present

## 2016-10-06 DIAGNOSIS — D649 Anemia, unspecified: Secondary | ICD-10-CM | POA: Diagnosis not present

## 2016-10-06 DIAGNOSIS — E669 Obesity, unspecified: Secondary | ICD-10-CM | POA: Diagnosis not present

## 2016-10-06 DIAGNOSIS — N189 Chronic kidney disease, unspecified: Secondary | ICD-10-CM | POA: Diagnosis not present

## 2016-10-06 DIAGNOSIS — M5136 Other intervertebral disc degeneration, lumbar region: Secondary | ICD-10-CM | POA: Diagnosis not present

## 2016-10-27 DIAGNOSIS — M8589 Other specified disorders of bone density and structure, multiple sites: Secondary | ICD-10-CM | POA: Diagnosis not present

## 2016-10-27 DIAGNOSIS — D0512 Intraductal carcinoma in situ of left breast: Secondary | ICD-10-CM | POA: Diagnosis not present

## 2016-10-27 DIAGNOSIS — M85852 Other specified disorders of bone density and structure, left thigh: Secondary | ICD-10-CM | POA: Diagnosis not present

## 2016-11-13 DIAGNOSIS — M4316 Spondylolisthesis, lumbar region: Secondary | ICD-10-CM | POA: Diagnosis not present

## 2016-11-13 DIAGNOSIS — M5416 Radiculopathy, lumbar region: Secondary | ICD-10-CM | POA: Diagnosis not present

## 2016-11-13 DIAGNOSIS — G629 Polyneuropathy, unspecified: Secondary | ICD-10-CM | POA: Diagnosis not present

## 2016-11-15 DIAGNOSIS — M8580 Other specified disorders of bone density and structure, unspecified site: Secondary | ICD-10-CM | POA: Diagnosis not present

## 2016-11-15 DIAGNOSIS — Z86000 Personal history of in-situ neoplasm of breast: Secondary | ICD-10-CM | POA: Diagnosis not present

## 2016-11-15 DIAGNOSIS — Z853 Personal history of malignant neoplasm of breast: Secondary | ICD-10-CM | POA: Diagnosis not present

## 2016-12-14 DIAGNOSIS — M5416 Radiculopathy, lumbar region: Secondary | ICD-10-CM | POA: Diagnosis not present

## 2016-12-14 DIAGNOSIS — G629 Polyneuropathy, unspecified: Secondary | ICD-10-CM | POA: Diagnosis not present

## 2016-12-14 DIAGNOSIS — M4316 Spondylolisthesis, lumbar region: Secondary | ICD-10-CM | POA: Diagnosis not present

## 2017-01-03 DIAGNOSIS — M4316 Spondylolisthesis, lumbar region: Secondary | ICD-10-CM | POA: Diagnosis not present

## 2017-01-11 DIAGNOSIS — M48061 Spinal stenosis, lumbar region without neurogenic claudication: Secondary | ICD-10-CM | POA: Diagnosis not present

## 2017-01-11 DIAGNOSIS — I10 Essential (primary) hypertension: Secondary | ICD-10-CM | POA: Diagnosis not present

## 2017-01-11 DIAGNOSIS — M4316 Spondylolisthesis, lumbar region: Secondary | ICD-10-CM | POA: Diagnosis not present

## 2017-01-11 DIAGNOSIS — G629 Polyneuropathy, unspecified: Secondary | ICD-10-CM | POA: Diagnosis not present

## 2017-01-11 DIAGNOSIS — M5416 Radiculopathy, lumbar region: Secondary | ICD-10-CM | POA: Diagnosis not present

## 2017-01-12 DIAGNOSIS — N61 Mastitis without abscess: Secondary | ICD-10-CM | POA: Diagnosis not present

## 2017-01-12 DIAGNOSIS — C50912 Malignant neoplasm of unspecified site of left female breast: Secondary | ICD-10-CM | POA: Diagnosis not present

## 2017-01-16 DIAGNOSIS — N611 Abscess of the breast and nipple: Secondary | ICD-10-CM | POA: Diagnosis not present

## 2017-01-16 DIAGNOSIS — D0512 Intraductal carcinoma in situ of left breast: Secondary | ICD-10-CM | POA: Diagnosis not present

## 2017-01-16 DIAGNOSIS — N644 Mastodynia: Secondary | ICD-10-CM | POA: Diagnosis not present

## 2017-01-22 DIAGNOSIS — N611 Abscess of the breast and nipple: Secondary | ICD-10-CM | POA: Diagnosis not present

## 2017-01-23 DIAGNOSIS — Z853 Personal history of malignant neoplasm of breast: Secondary | ICD-10-CM | POA: Diagnosis not present

## 2017-01-23 DIAGNOSIS — Z7982 Long term (current) use of aspirin: Secondary | ICD-10-CM | POA: Diagnosis not present

## 2017-01-23 DIAGNOSIS — E1122 Type 2 diabetes mellitus with diabetic chronic kidney disease: Secondary | ICD-10-CM | POA: Diagnosis not present

## 2017-01-23 DIAGNOSIS — Z6838 Body mass index (BMI) 38.0-38.9, adult: Secondary | ICD-10-CM | POA: Diagnosis not present

## 2017-01-23 DIAGNOSIS — E669 Obesity, unspecified: Secondary | ICD-10-CM | POA: Diagnosis not present

## 2017-01-23 DIAGNOSIS — M109 Gout, unspecified: Secondary | ICD-10-CM | POA: Diagnosis not present

## 2017-01-23 DIAGNOSIS — M21371 Foot drop, right foot: Secondary | ICD-10-CM | POA: Diagnosis not present

## 2017-01-23 DIAGNOSIS — M1990 Unspecified osteoarthritis, unspecified site: Secondary | ICD-10-CM | POA: Diagnosis not present

## 2017-01-23 DIAGNOSIS — Z7984 Long term (current) use of oral hypoglycemic drugs: Secondary | ICD-10-CM | POA: Diagnosis not present

## 2017-01-23 DIAGNOSIS — I129 Hypertensive chronic kidney disease with stage 1 through stage 4 chronic kidney disease, or unspecified chronic kidney disease: Secondary | ICD-10-CM | POA: Diagnosis not present

## 2017-01-23 DIAGNOSIS — E1142 Type 2 diabetes mellitus with diabetic polyneuropathy: Secondary | ICD-10-CM | POA: Diagnosis not present

## 2017-01-23 DIAGNOSIS — N611 Abscess of the breast and nipple: Secondary | ICD-10-CM | POA: Diagnosis not present

## 2017-01-23 DIAGNOSIS — M21372 Foot drop, left foot: Secondary | ICD-10-CM | POA: Diagnosis not present

## 2017-01-23 DIAGNOSIS — N189 Chronic kidney disease, unspecified: Secondary | ICD-10-CM | POA: Diagnosis not present

## 2017-01-23 DIAGNOSIS — M4306 Spondylolysis, lumbar region: Secondary | ICD-10-CM | POA: Diagnosis not present

## 2017-02-05 DIAGNOSIS — N611 Abscess of the breast and nipple: Secondary | ICD-10-CM | POA: Diagnosis not present

## 2017-02-11 DIAGNOSIS — M4316 Spondylolisthesis, lumbar region: Secondary | ICD-10-CM | POA: Diagnosis not present

## 2017-02-11 DIAGNOSIS — M5416 Radiculopathy, lumbar region: Secondary | ICD-10-CM | POA: Diagnosis not present

## 2017-02-11 DIAGNOSIS — I10 Essential (primary) hypertension: Secondary | ICD-10-CM | POA: Diagnosis not present

## 2017-02-11 DIAGNOSIS — G629 Polyneuropathy, unspecified: Secondary | ICD-10-CM | POA: Diagnosis not present

## 2017-02-23 DIAGNOSIS — E119 Type 2 diabetes mellitus without complications: Secondary | ICD-10-CM | POA: Diagnosis not present

## 2017-02-23 DIAGNOSIS — H5202 Hypermetropia, left eye: Secondary | ICD-10-CM | POA: Diagnosis not present

## 2017-03-13 DIAGNOSIS — M5416 Radiculopathy, lumbar region: Secondary | ICD-10-CM | POA: Diagnosis not present

## 2017-03-13 DIAGNOSIS — M4316 Spondylolisthesis, lumbar region: Secondary | ICD-10-CM | POA: Diagnosis not present

## 2017-03-13 DIAGNOSIS — G629 Polyneuropathy, unspecified: Secondary | ICD-10-CM | POA: Diagnosis not present

## 2017-03-13 DIAGNOSIS — I10 Essential (primary) hypertension: Secondary | ICD-10-CM | POA: Diagnosis not present

## 2017-04-11 DIAGNOSIS — E782 Mixed hyperlipidemia: Secondary | ICD-10-CM | POA: Diagnosis not present

## 2017-04-11 DIAGNOSIS — R110 Nausea: Secondary | ICD-10-CM | POA: Diagnosis not present

## 2017-04-11 DIAGNOSIS — E1165 Type 2 diabetes mellitus with hyperglycemia: Secondary | ICD-10-CM | POA: Diagnosis not present

## 2017-04-11 DIAGNOSIS — I10 Essential (primary) hypertension: Secondary | ICD-10-CM | POA: Diagnosis not present

## 2017-04-11 DIAGNOSIS — M48062 Spinal stenosis, lumbar region with neurogenic claudication: Secondary | ICD-10-CM | POA: Diagnosis not present

## 2017-04-11 DIAGNOSIS — K219 Gastro-esophageal reflux disease without esophagitis: Secondary | ICD-10-CM | POA: Diagnosis not present

## 2017-04-11 DIAGNOSIS — M1A9XX0 Chronic gout, unspecified, without tophus (tophi): Secondary | ICD-10-CM | POA: Diagnosis not present

## 2017-04-11 DIAGNOSIS — E559 Vitamin D deficiency, unspecified: Secondary | ICD-10-CM | POA: Diagnosis not present

## 2017-04-13 DIAGNOSIS — M5416 Radiculopathy, lumbar region: Secondary | ICD-10-CM | POA: Diagnosis not present

## 2017-04-13 DIAGNOSIS — G629 Polyneuropathy, unspecified: Secondary | ICD-10-CM | POA: Diagnosis not present

## 2017-04-13 DIAGNOSIS — I10 Essential (primary) hypertension: Secondary | ICD-10-CM | POA: Diagnosis not present

## 2017-04-13 DIAGNOSIS — M4316 Spondylolisthesis, lumbar region: Secondary | ICD-10-CM | POA: Diagnosis not present

## 2017-04-16 DIAGNOSIS — D0512 Intraductal carcinoma in situ of left breast: Secondary | ICD-10-CM | POA: Diagnosis not present

## 2017-04-16 DIAGNOSIS — Z6838 Body mass index (BMI) 38.0-38.9, adult: Secondary | ICD-10-CM | POA: Diagnosis not present

## 2017-05-13 DIAGNOSIS — G629 Polyneuropathy, unspecified: Secondary | ICD-10-CM | POA: Diagnosis not present

## 2017-05-13 DIAGNOSIS — M5416 Radiculopathy, lumbar region: Secondary | ICD-10-CM | POA: Diagnosis not present

## 2017-05-13 DIAGNOSIS — M4316 Spondylolisthesis, lumbar region: Secondary | ICD-10-CM | POA: Diagnosis not present

## 2017-05-13 DIAGNOSIS — I10 Essential (primary) hypertension: Secondary | ICD-10-CM | POA: Diagnosis not present

## 2017-05-13 DIAGNOSIS — M48061 Spinal stenosis, lumbar region without neurogenic claudication: Secondary | ICD-10-CM | POA: Diagnosis not present

## 2017-06-13 DIAGNOSIS — I10 Essential (primary) hypertension: Secondary | ICD-10-CM | POA: Diagnosis not present

## 2017-06-13 DIAGNOSIS — M5416 Radiculopathy, lumbar region: Secondary | ICD-10-CM | POA: Diagnosis not present

## 2017-06-13 DIAGNOSIS — M4316 Spondylolisthesis, lumbar region: Secondary | ICD-10-CM | POA: Diagnosis not present

## 2017-06-13 DIAGNOSIS — G629 Polyneuropathy, unspecified: Secondary | ICD-10-CM | POA: Diagnosis not present

## 2017-07-14 DIAGNOSIS — M4316 Spondylolisthesis, lumbar region: Secondary | ICD-10-CM | POA: Diagnosis not present

## 2017-07-14 DIAGNOSIS — I10 Essential (primary) hypertension: Secondary | ICD-10-CM | POA: Diagnosis not present

## 2017-07-14 DIAGNOSIS — M48061 Spinal stenosis, lumbar region without neurogenic claudication: Secondary | ICD-10-CM | POA: Diagnosis not present

## 2017-07-14 DIAGNOSIS — G629 Polyneuropathy, unspecified: Secondary | ICD-10-CM | POA: Diagnosis not present

## 2017-07-14 DIAGNOSIS — M5416 Radiculopathy, lumbar region: Secondary | ICD-10-CM | POA: Diagnosis not present

## 2017-07-16 DIAGNOSIS — E1165 Type 2 diabetes mellitus with hyperglycemia: Secondary | ICD-10-CM | POA: Diagnosis not present

## 2017-07-16 DIAGNOSIS — Z23 Encounter for immunization: Secondary | ICD-10-CM | POA: Diagnosis not present

## 2017-07-30 DIAGNOSIS — R928 Other abnormal and inconclusive findings on diagnostic imaging of breast: Secondary | ICD-10-CM | POA: Diagnosis not present

## 2017-07-30 DIAGNOSIS — D0512 Intraductal carcinoma in situ of left breast: Secondary | ICD-10-CM | POA: Diagnosis not present

## 2017-08-13 DIAGNOSIS — D0512 Intraductal carcinoma in situ of left breast: Secondary | ICD-10-CM | POA: Diagnosis not present

## 2017-08-13 DIAGNOSIS — M4316 Spondylolisthesis, lumbar region: Secondary | ICD-10-CM | POA: Diagnosis not present

## 2017-08-13 DIAGNOSIS — I10 Essential (primary) hypertension: Secondary | ICD-10-CM | POA: Diagnosis not present

## 2017-08-13 DIAGNOSIS — G629 Polyneuropathy, unspecified: Secondary | ICD-10-CM | POA: Diagnosis not present

## 2017-08-13 DIAGNOSIS — Z6838 Body mass index (BMI) 38.0-38.9, adult: Secondary | ICD-10-CM | POA: Diagnosis not present

## 2017-08-13 DIAGNOSIS — M5416 Radiculopathy, lumbar region: Secondary | ICD-10-CM | POA: Diagnosis not present

## 2017-08-13 DIAGNOSIS — M48061 Spinal stenosis, lumbar region without neurogenic claudication: Secondary | ICD-10-CM | POA: Diagnosis not present

## 2017-08-21 DIAGNOSIS — J40 Bronchitis, not specified as acute or chronic: Secondary | ICD-10-CM | POA: Diagnosis not present

## 2017-10-17 DIAGNOSIS — E782 Mixed hyperlipidemia: Secondary | ICD-10-CM | POA: Diagnosis not present

## 2017-10-17 DIAGNOSIS — E1165 Type 2 diabetes mellitus with hyperglycemia: Secondary | ICD-10-CM | POA: Diagnosis not present

## 2017-10-17 DIAGNOSIS — I129 Hypertensive chronic kidney disease with stage 1 through stage 4 chronic kidney disease, or unspecified chronic kidney disease: Secondary | ICD-10-CM | POA: Diagnosis not present

## 2017-10-17 DIAGNOSIS — N183 Chronic kidney disease, stage 3 (moderate): Secondary | ICD-10-CM | POA: Diagnosis not present

## 2018-02-24 IMAGING — CT CT L SPINE W/ CM
3 of 4 series · 11 of 33 positions shown, 13 images · IV contrast (Omni 300)
Comparison: Lumbar MRI 09/01/2015

CLINICAL DATA: Back pain with lumbar radiculopathy. Back surgery
most recently October 2015

EXAM:
LUMBAR MYELOGRAM
PROCEDURE:
Lumbar puncture and intrathecal contrast administration were
performed by lumbar MRI 09/01/2015 Who will separately report for
the portion of the procedure. I personally supervised acquisition of
the myelogram images.
TECHNIQUE: Contiguous axial images were obtained through the Lumbar spine after
the intrathecal infusion of infusion. Coronal and sagittal
reconstructions were obtained of the axial image sets.

[Series 4: l-spine 2.0 (person_name) (person_name) · axial · 0.33mm/px · z∈[+872,+1096]mm · 3 of 168 slices shown, 4 images]
[im 28/168  soft-tissue]
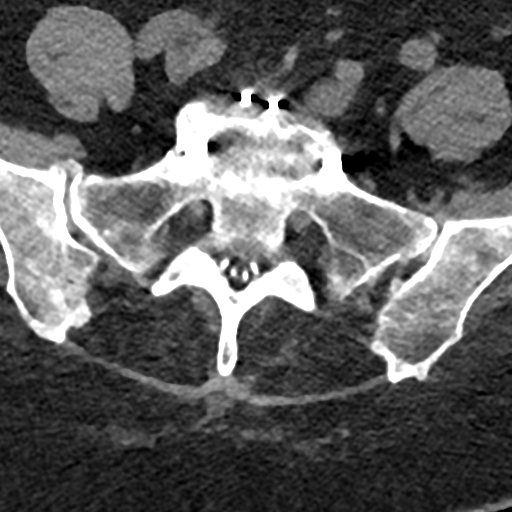
[im 28/168  bone]
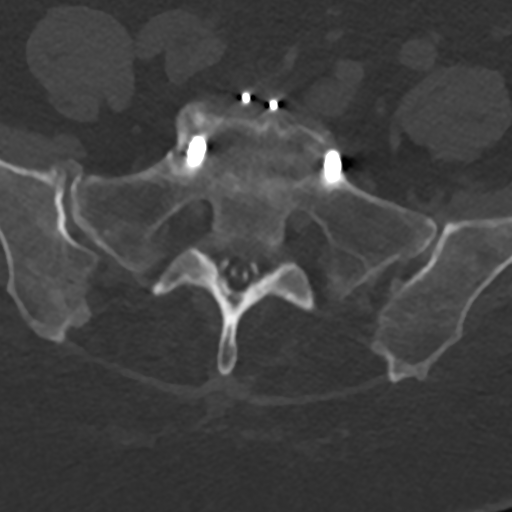
[im 84/168  bone]
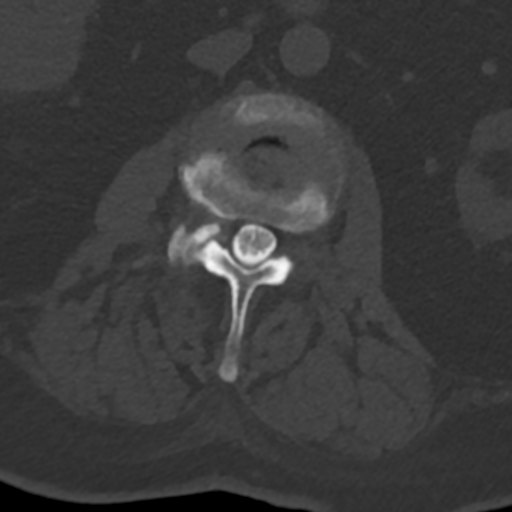
[im 140/168  bone]
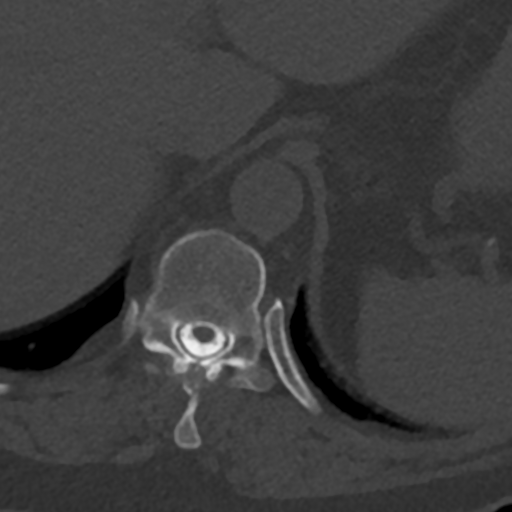

[Series 8: l-spine 2.0 cor bone · coronal · 0.44mm/px · 3 of 84 slices shown]
[im 17/84  bone]
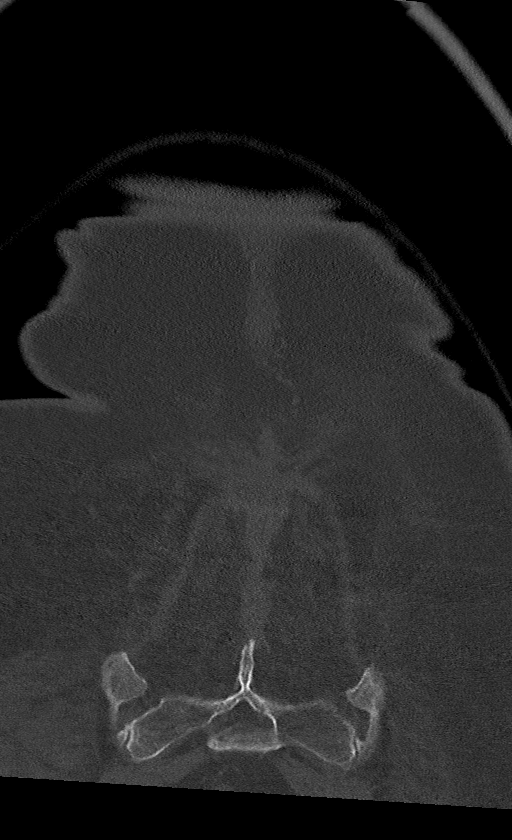
[im 34/84  bone]
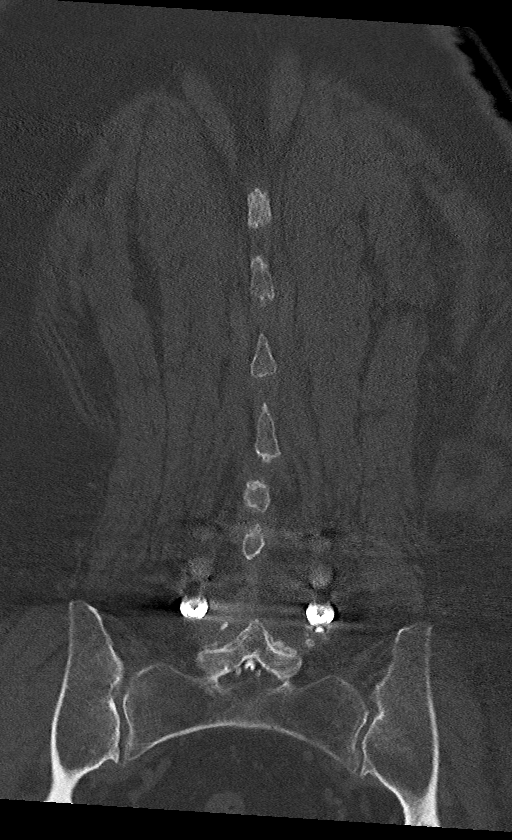
[im 50/84  bone]
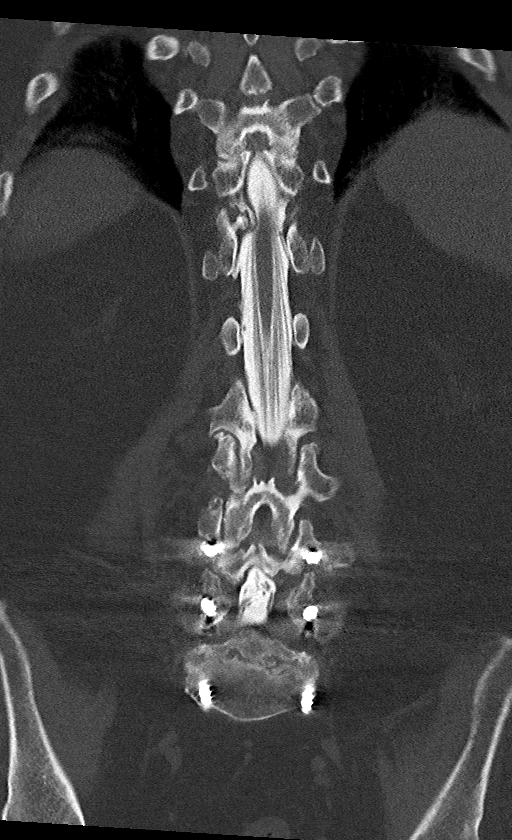

[Series 9: l-spine 2.0 sag bone · sagittal · 0.39mm/px · 5 of 95 slices shown, 6 images]
[im 32/95  bone]
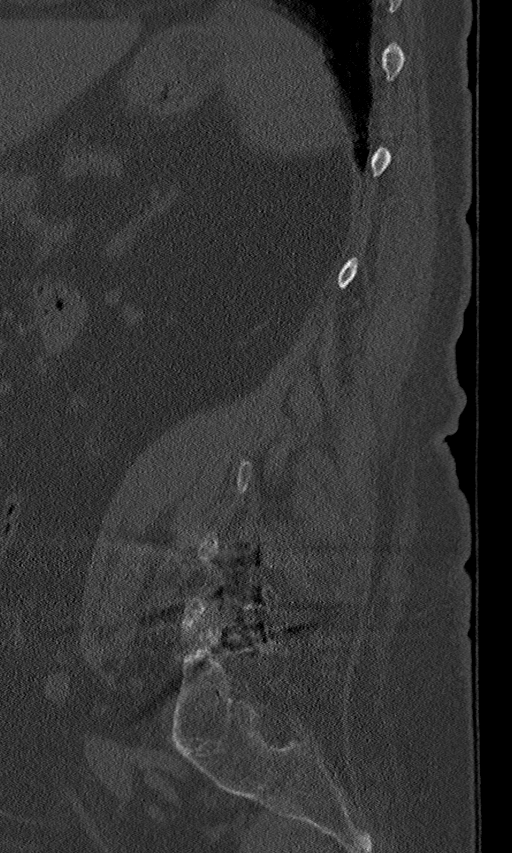
[im 40/95  bone]
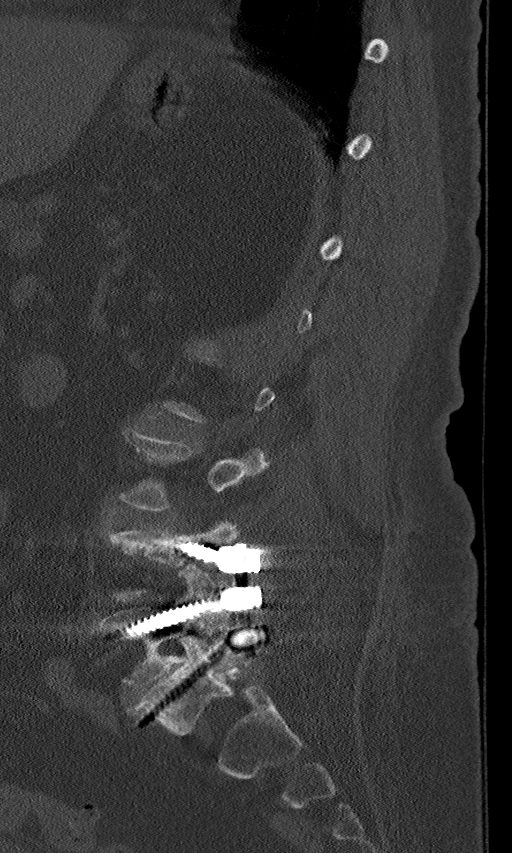
[im 48/95  soft-tissue]
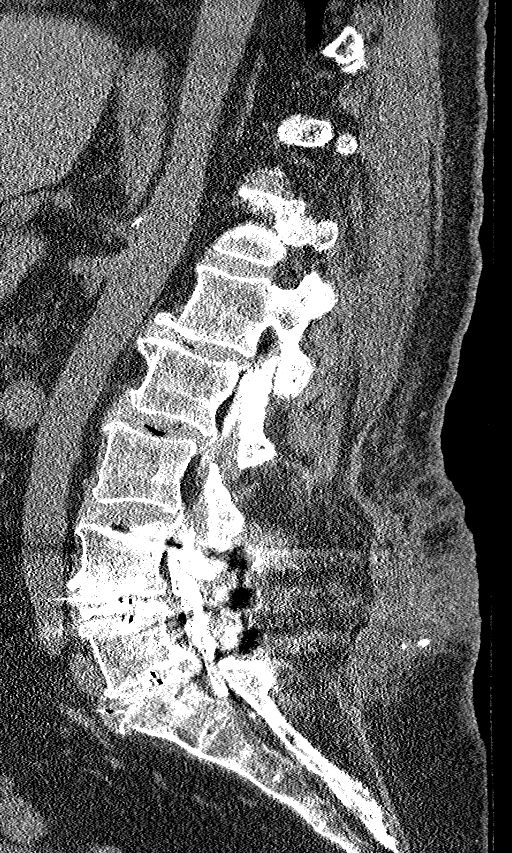
[im 48/95  bone]
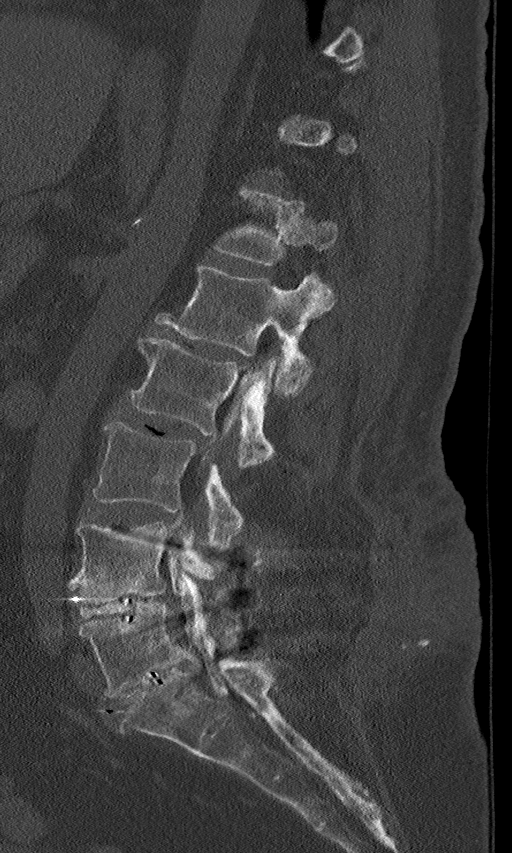
[im 55/95  bone]
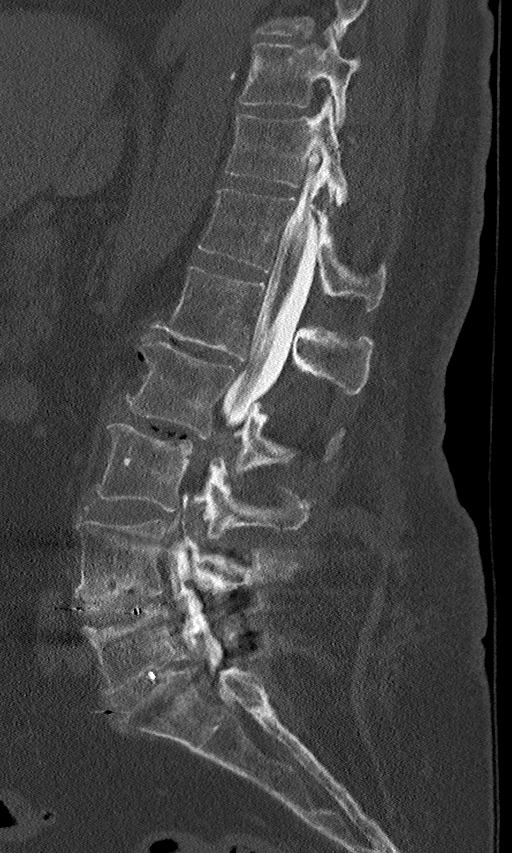
[im 63/95  bone]
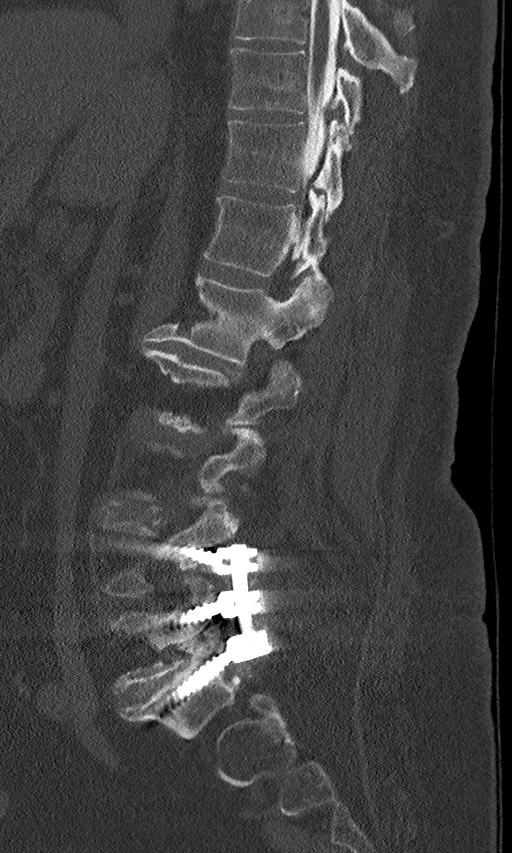

[11 of 33 positions shown; findings below may reference images not displayed]

FINDINGS: LUMBAR MYELOGRAM FINDINGS:

Pedicle screw and interbody fusion L4-5 and L5-S1. Mild
retrolisthesis L1-2.

Severe spinal stenosis L2-3 and L3-4 with nerve root entrapment. No
significant spinal stenosis at L4-5. Incomplete filling of thecal
sac at L5-S1. Hardware appears to be in satisfactory position.

CT LUMBAR MYELOGRAM FINDINGS:

Mild retrolisthesis L1-2, L2-3, L3-4. Negative for fracture or mass
lesion. Pedicle screw fusion L4-5 and L5-S1.

Negative for retroperitoneal mass or adenopathy.

T12-L1:  Negative

L1-2:  Mild retrolisthesis.  Disc bulging and mild spurring

L2-3: Mild retrolisthesis. Diffuse disc bulging and endplate
spurring. Bilateral facet hypertrophy. Severe spinal stenosis. There
has been progression of spinal stenosis since the prior study. In
addition, there is severe right foraminal encroachment. Left foramen
adequately patent.

L3-4: Mild retrolisthesis. Disc bulging and endplate spurring.
Moderate facet and ligament flavum hypertrophy causing severe spinal
stenosis. Severe right foraminal encroachment due to spurring and
facet hypertrophy. Moderate left foraminal encroachment.

L4-5: Pedicle screw and interbody fusion. Left L4 screw extends
through the superior endplate and into the disc space at L3-4. Early
bone growth across the disc space. Posterior decompression. No
significant spinal or foraminal stenosis.

L5-S1: Pedicle screw and interbody fusion. Satisfactory hardware
placement. Early bone growth across the disc space. Disc
degeneration and spondylosis with endplate spurring bilaterally.
Bilateral facet hypertrophy. Mild foraminal narrowing bilaterally
due to spurring. Overall improvement in foraminal narrowing since
the prior MRI.
IMPRESSION: CT LUMBAR MYELOGRAM IMPRESSION:

Severe spinal stenosis at L2-3 with severe right foraminal
encroachment.

Severe spinal stenosis L3-4 with severe right foraminal encroachment
and moderate left foraminal encroachment

L4-5 fusion with solid fusion and no significant spinal or foraminal
stenosis

L5-S1 fusion with solid bony fusion. Mild foraminal narrowing
bilaterally has improved since the prior study.

## 2018-03-08 DIAGNOSIS — D3132 Benign neoplasm of left choroid: Secondary | ICD-10-CM | POA: Diagnosis not present

## 2018-04-15 DIAGNOSIS — E782 Mixed hyperlipidemia: Secondary | ICD-10-CM | POA: Diagnosis not present

## 2018-04-15 DIAGNOSIS — E1165 Type 2 diabetes mellitus with hyperglycemia: Secondary | ICD-10-CM | POA: Diagnosis not present

## 2018-04-15 DIAGNOSIS — Z0000 Encounter for general adult medical examination without abnormal findings: Secondary | ICD-10-CM | POA: Diagnosis not present

## 2018-04-15 DIAGNOSIS — E1122 Type 2 diabetes mellitus with diabetic chronic kidney disease: Secondary | ICD-10-CM | POA: Diagnosis not present

## 2018-04-15 DIAGNOSIS — I129 Hypertensive chronic kidney disease with stage 1 through stage 4 chronic kidney disease, or unspecified chronic kidney disease: Secondary | ICD-10-CM | POA: Diagnosis not present

## 2018-04-15 DIAGNOSIS — N183 Chronic kidney disease, stage 3 (moderate): Secondary | ICD-10-CM | POA: Diagnosis not present

## 2018-07-13 DIAGNOSIS — Z23 Encounter for immunization: Secondary | ICD-10-CM | POA: Diagnosis not present

## 2018-07-31 DIAGNOSIS — D0512 Intraductal carcinoma in situ of left breast: Secondary | ICD-10-CM | POA: Diagnosis not present

## 2018-07-31 DIAGNOSIS — R928 Other abnormal and inconclusive findings on diagnostic imaging of breast: Secondary | ICD-10-CM | POA: Diagnosis not present

## 2018-08-06 DIAGNOSIS — D0512 Intraductal carcinoma in situ of left breast: Secondary | ICD-10-CM | POA: Diagnosis not present

## 2018-08-07 DIAGNOSIS — N3000 Acute cystitis without hematuria: Secondary | ICD-10-CM | POA: Diagnosis not present

## 2018-08-07 DIAGNOSIS — R350 Frequency of micturition: Secondary | ICD-10-CM | POA: Diagnosis not present

## 2018-10-25 DIAGNOSIS — E782 Mixed hyperlipidemia: Secondary | ICD-10-CM | POA: Diagnosis not present

## 2018-10-25 DIAGNOSIS — E1165 Type 2 diabetes mellitus with hyperglycemia: Secondary | ICD-10-CM | POA: Diagnosis not present

## 2018-10-25 DIAGNOSIS — K219 Gastro-esophageal reflux disease without esophagitis: Secondary | ICD-10-CM | POA: Diagnosis not present

## 2018-10-25 DIAGNOSIS — N183 Chronic kidney disease, stage 3 (moderate): Secondary | ICD-10-CM | POA: Diagnosis not present

## 2018-10-25 DIAGNOSIS — E1122 Type 2 diabetes mellitus with diabetic chronic kidney disease: Secondary | ICD-10-CM | POA: Diagnosis not present

## 2018-10-25 DIAGNOSIS — M48062 Spinal stenosis, lumbar region with neurogenic claudication: Secondary | ICD-10-CM | POA: Diagnosis not present

## 2018-10-25 DIAGNOSIS — I129 Hypertensive chronic kidney disease with stage 1 through stage 4 chronic kidney disease, or unspecified chronic kidney disease: Secondary | ICD-10-CM | POA: Diagnosis not present

## 2018-12-16 DIAGNOSIS — N390 Urinary tract infection, site not specified: Secondary | ICD-10-CM | POA: Diagnosis not present

## 2018-12-16 DIAGNOSIS — R350 Frequency of micturition: Secondary | ICD-10-CM | POA: Diagnosis not present

## 2019-03-31 DIAGNOSIS — M48062 Spinal stenosis, lumbar region with neurogenic claudication: Secondary | ICD-10-CM | POA: Diagnosis not present

## 2019-03-31 DIAGNOSIS — I129 Hypertensive chronic kidney disease with stage 1 through stage 4 chronic kidney disease, or unspecified chronic kidney disease: Secondary | ICD-10-CM | POA: Diagnosis not present

## 2019-03-31 DIAGNOSIS — F325 Major depressive disorder, single episode, in full remission: Secondary | ICD-10-CM | POA: Diagnosis not present

## 2019-03-31 DIAGNOSIS — N183 Chronic kidney disease, stage 3 (moderate): Secondary | ICD-10-CM | POA: Diagnosis not present

## 2019-03-31 DIAGNOSIS — R110 Nausea: Secondary | ICD-10-CM | POA: Diagnosis not present

## 2019-03-31 DIAGNOSIS — E1165 Type 2 diabetes mellitus with hyperglycemia: Secondary | ICD-10-CM | POA: Diagnosis not present

## 2019-03-31 DIAGNOSIS — E782 Mixed hyperlipidemia: Secondary | ICD-10-CM | POA: Diagnosis not present

## 2019-03-31 DIAGNOSIS — E1121 Type 2 diabetes mellitus with diabetic nephropathy: Secondary | ICD-10-CM | POA: Diagnosis not present

## 2019-03-31 DIAGNOSIS — I10 Essential (primary) hypertension: Secondary | ICD-10-CM | POA: Diagnosis not present

## 2019-03-31 DIAGNOSIS — Z8739 Personal history of other diseases of the musculoskeletal system and connective tissue: Secondary | ICD-10-CM | POA: Diagnosis not present

## 2019-03-31 DIAGNOSIS — K219 Gastro-esophageal reflux disease without esophagitis: Secondary | ICD-10-CM | POA: Diagnosis not present

## 2019-03-31 DIAGNOSIS — Z6841 Body Mass Index (BMI) 40.0 and over, adult: Secondary | ICD-10-CM | POA: Diagnosis not present

## 2019-04-03 DIAGNOSIS — M12812 Other specific arthropathies, not elsewhere classified, left shoulder: Secondary | ICD-10-CM | POA: Diagnosis not present

## 2019-05-20 DIAGNOSIS — H5202 Hypermetropia, left eye: Secondary | ICD-10-CM | POA: Diagnosis not present

## 2019-05-20 DIAGNOSIS — H2513 Age-related nuclear cataract, bilateral: Secondary | ICD-10-CM | POA: Diagnosis not present

## 2019-05-20 DIAGNOSIS — E119 Type 2 diabetes mellitus without complications: Secondary | ICD-10-CM | POA: Diagnosis not present

## 2019-05-20 DIAGNOSIS — D3132 Benign neoplasm of left choroid: Secondary | ICD-10-CM | POA: Diagnosis not present

## 2019-07-15 DIAGNOSIS — E782 Mixed hyperlipidemia: Secondary | ICD-10-CM | POA: Diagnosis not present

## 2019-07-15 DIAGNOSIS — Z23 Encounter for immunization: Secondary | ICD-10-CM | POA: Diagnosis not present

## 2019-07-15 DIAGNOSIS — E1165 Type 2 diabetes mellitus with hyperglycemia: Secondary | ICD-10-CM | POA: Diagnosis not present

## 2019-07-15 DIAGNOSIS — Z0000 Encounter for general adult medical examination without abnormal findings: Secondary | ICD-10-CM | POA: Diagnosis not present

## 2019-07-15 DIAGNOSIS — N183 Chronic kidney disease, stage 3 (moderate): Secondary | ICD-10-CM | POA: Diagnosis not present

## 2019-07-16 DIAGNOSIS — Z23 Encounter for immunization: Secondary | ICD-10-CM | POA: Diagnosis not present

## 2019-08-04 DIAGNOSIS — Z853 Personal history of malignant neoplasm of breast: Secondary | ICD-10-CM | POA: Diagnosis not present

## 2019-08-04 DIAGNOSIS — D0512 Intraductal carcinoma in situ of left breast: Secondary | ICD-10-CM | POA: Diagnosis not present

## 2019-08-04 DIAGNOSIS — R928 Other abnormal and inconclusive findings on diagnostic imaging of breast: Secondary | ICD-10-CM | POA: Diagnosis not present

## 2019-08-11 DIAGNOSIS — D0512 Intraductal carcinoma in situ of left breast: Secondary | ICD-10-CM | POA: Diagnosis not present

## 2020-01-12 DIAGNOSIS — N1831 Chronic kidney disease, stage 3a: Secondary | ICD-10-CM | POA: Diagnosis not present

## 2020-01-12 DIAGNOSIS — E1121 Type 2 diabetes mellitus with diabetic nephropathy: Secondary | ICD-10-CM | POA: Diagnosis not present

## 2020-01-12 DIAGNOSIS — K219 Gastro-esophageal reflux disease without esophagitis: Secondary | ICD-10-CM | POA: Diagnosis not present

## 2020-01-12 DIAGNOSIS — I129 Hypertensive chronic kidney disease with stage 1 through stage 4 chronic kidney disease, or unspecified chronic kidney disease: Secondary | ICD-10-CM | POA: Diagnosis not present

## 2020-01-12 DIAGNOSIS — E782 Mixed hyperlipidemia: Secondary | ICD-10-CM | POA: Diagnosis not present

## 2020-01-12 DIAGNOSIS — E1165 Type 2 diabetes mellitus with hyperglycemia: Secondary | ICD-10-CM | POA: Diagnosis not present

## 2020-01-12 DIAGNOSIS — F325 Major depressive disorder, single episode, in full remission: Secondary | ICD-10-CM | POA: Diagnosis not present

## 2020-01-12 DIAGNOSIS — I10 Essential (primary) hypertension: Secondary | ICD-10-CM | POA: Diagnosis not present

## 2021-01-24 DIAGNOSIS — I361 Nonrheumatic tricuspid (valve) insufficiency: Secondary | ICD-10-CM | POA: Diagnosis not present

## 2021-01-24 DIAGNOSIS — I340 Nonrheumatic mitral (valve) insufficiency: Secondary | ICD-10-CM | POA: Diagnosis not present

## 2021-01-25 DIAGNOSIS — I4891 Unspecified atrial fibrillation: Secondary | ICD-10-CM | POA: Diagnosis not present

## 2021-12-10 DIAGNOSIS — I10 Essential (primary) hypertension: Secondary | ICD-10-CM | POA: Diagnosis not present

## 2021-12-10 DIAGNOSIS — E119 Type 2 diabetes mellitus without complications: Secondary | ICD-10-CM | POA: Diagnosis not present

## 2021-12-10 DIAGNOSIS — I4891 Unspecified atrial fibrillation: Secondary | ICD-10-CM | POA: Diagnosis not present

## 2021-12-10 DIAGNOSIS — N289 Disorder of kidney and ureter, unspecified: Secondary | ICD-10-CM | POA: Diagnosis not present

## 2023-04-05 DIAGNOSIS — I361 Nonrheumatic tricuspid (valve) insufficiency: Secondary | ICD-10-CM

## 2023-04-05 DIAGNOSIS — I350 Nonrheumatic aortic (valve) stenosis: Secondary | ICD-10-CM

## 2023-04-05 DIAGNOSIS — I340 Nonrheumatic mitral (valve) insufficiency: Secondary | ICD-10-CM

## 2023-04-26 DIAGNOSIS — R0602 Shortness of breath: Secondary | ICD-10-CM | POA: Diagnosis not present

## 2023-05-03 ENCOUNTER — Encounter: Payer: Self-pay | Admitting: Internal Medicine

## 2023-10-29 DIAGNOSIS — I130 Hypertensive heart and chronic kidney disease with heart failure and stage 1 through stage 4 chronic kidney disease, or unspecified chronic kidney disease: Secondary | ICD-10-CM | POA: Diagnosis not present

## 2023-10-29 DIAGNOSIS — E1142 Type 2 diabetes mellitus with diabetic polyneuropathy: Secondary | ICD-10-CM | POA: Diagnosis not present

## 2023-10-29 DIAGNOSIS — Z794 Long term (current) use of insulin: Secondary | ICD-10-CM | POA: Diagnosis not present

## 2023-10-29 DIAGNOSIS — Z6838 Body mass index (BMI) 38.0-38.9, adult: Secondary | ICD-10-CM | POA: Diagnosis not present

## 2023-10-29 DIAGNOSIS — N184 Chronic kidney disease, stage 4 (severe): Secondary | ICD-10-CM | POA: Diagnosis not present

## 2023-10-29 DIAGNOSIS — Z7984 Long term (current) use of oral hypoglycemic drugs: Secondary | ICD-10-CM | POA: Diagnosis not present

## 2023-10-29 DIAGNOSIS — Z7901 Long term (current) use of anticoagulants: Secondary | ICD-10-CM | POA: Diagnosis not present

## 2023-10-29 DIAGNOSIS — E1122 Type 2 diabetes mellitus with diabetic chronic kidney disease: Secondary | ICD-10-CM | POA: Diagnosis not present

## 2023-10-29 DIAGNOSIS — E669 Obesity, unspecified: Secondary | ICD-10-CM | POA: Diagnosis not present

## 2023-10-29 DIAGNOSIS — I509 Heart failure, unspecified: Secondary | ICD-10-CM | POA: Diagnosis not present

## 2023-10-29 DIAGNOSIS — I4891 Unspecified atrial fibrillation: Secondary | ICD-10-CM | POA: Diagnosis not present

## 2023-11-29 DIAGNOSIS — E1122 Type 2 diabetes mellitus with diabetic chronic kidney disease: Secondary | ICD-10-CM | POA: Diagnosis not present

## 2023-11-29 DIAGNOSIS — E669 Obesity, unspecified: Secondary | ICD-10-CM | POA: Diagnosis not present

## 2023-11-29 DIAGNOSIS — I509 Heart failure, unspecified: Secondary | ICD-10-CM | POA: Diagnosis not present

## 2023-11-29 DIAGNOSIS — Z7984 Long term (current) use of oral hypoglycemic drugs: Secondary | ICD-10-CM | POA: Diagnosis not present

## 2023-11-29 DIAGNOSIS — I4891 Unspecified atrial fibrillation: Secondary | ICD-10-CM | POA: Diagnosis not present

## 2023-11-29 DIAGNOSIS — E1142 Type 2 diabetes mellitus with diabetic polyneuropathy: Secondary | ICD-10-CM | POA: Diagnosis not present

## 2023-11-29 DIAGNOSIS — Z7901 Long term (current) use of anticoagulants: Secondary | ICD-10-CM | POA: Diagnosis not present

## 2023-11-29 DIAGNOSIS — Z6838 Body mass index (BMI) 38.0-38.9, adult: Secondary | ICD-10-CM | POA: Diagnosis not present

## 2023-11-29 DIAGNOSIS — Z794 Long term (current) use of insulin: Secondary | ICD-10-CM | POA: Diagnosis not present

## 2023-11-29 DIAGNOSIS — I130 Hypertensive heart and chronic kidney disease with heart failure and stage 1 through stage 4 chronic kidney disease, or unspecified chronic kidney disease: Secondary | ICD-10-CM | POA: Diagnosis not present

## 2023-11-29 DIAGNOSIS — N184 Chronic kidney disease, stage 4 (severe): Secondary | ICD-10-CM | POA: Diagnosis not present

## 2023-12-26 DIAGNOSIS — Z6838 Body mass index (BMI) 38.0-38.9, adult: Secondary | ICD-10-CM | POA: Diagnosis not present

## 2023-12-26 DIAGNOSIS — E669 Obesity, unspecified: Secondary | ICD-10-CM | POA: Diagnosis not present

## 2023-12-26 DIAGNOSIS — Z7901 Long term (current) use of anticoagulants: Secondary | ICD-10-CM | POA: Diagnosis not present

## 2023-12-26 DIAGNOSIS — E1122 Type 2 diabetes mellitus with diabetic chronic kidney disease: Secondary | ICD-10-CM | POA: Diagnosis not present

## 2023-12-26 DIAGNOSIS — I130 Hypertensive heart and chronic kidney disease with heart failure and stage 1 through stage 4 chronic kidney disease, or unspecified chronic kidney disease: Secondary | ICD-10-CM | POA: Diagnosis not present

## 2023-12-26 DIAGNOSIS — Z7984 Long term (current) use of oral hypoglycemic drugs: Secondary | ICD-10-CM | POA: Diagnosis not present

## 2023-12-26 DIAGNOSIS — I509 Heart failure, unspecified: Secondary | ICD-10-CM | POA: Diagnosis not present

## 2023-12-26 DIAGNOSIS — N184 Chronic kidney disease, stage 4 (severe): Secondary | ICD-10-CM | POA: Diagnosis not present

## 2023-12-26 DIAGNOSIS — I4891 Unspecified atrial fibrillation: Secondary | ICD-10-CM | POA: Diagnosis not present

## 2023-12-26 DIAGNOSIS — Z794 Long term (current) use of insulin: Secondary | ICD-10-CM | POA: Diagnosis not present

## 2023-12-26 DIAGNOSIS — E1142 Type 2 diabetes mellitus with diabetic polyneuropathy: Secondary | ICD-10-CM | POA: Diagnosis not present

## 2024-02-13 DIAGNOSIS — I5022 Chronic systolic (congestive) heart failure: Secondary | ICD-10-CM | POA: Diagnosis not present

## 2024-02-13 DIAGNOSIS — N189 Chronic kidney disease, unspecified: Secondary | ICD-10-CM | POA: Diagnosis not present

## 2024-02-13 DIAGNOSIS — E1142 Type 2 diabetes mellitus with diabetic polyneuropathy: Secondary | ICD-10-CM | POA: Diagnosis not present

## 2024-02-13 DIAGNOSIS — R609 Edema, unspecified: Secondary | ICD-10-CM | POA: Diagnosis not present

## 2024-03-04 DIAGNOSIS — Z7689 Persons encountering health services in other specified circumstances: Secondary | ICD-10-CM | POA: Diagnosis not present

## 2024-03-04 DIAGNOSIS — Z794 Long term (current) use of insulin: Secondary | ICD-10-CM | POA: Diagnosis not present

## 2024-03-04 DIAGNOSIS — I495 Sick sinus syndrome: Secondary | ICD-10-CM | POA: Diagnosis not present

## 2024-03-04 DIAGNOSIS — N184 Chronic kidney disease, stage 4 (severe): Secondary | ICD-10-CM | POA: Diagnosis not present

## 2024-03-04 DIAGNOSIS — I10 Essential (primary) hypertension: Secondary | ICD-10-CM | POA: Diagnosis not present

## 2024-03-04 DIAGNOSIS — E1165 Type 2 diabetes mellitus with hyperglycemia: Secondary | ICD-10-CM | POA: Diagnosis not present

## 2024-07-08 DIAGNOSIS — E559 Vitamin D deficiency, unspecified: Secondary | ICD-10-CM | POA: Diagnosis not present

## 2024-07-08 DIAGNOSIS — R7309 Other abnormal glucose: Secondary | ICD-10-CM | POA: Diagnosis not present

## 2024-07-08 DIAGNOSIS — E782 Mixed hyperlipidemia: Secondary | ICD-10-CM | POA: Diagnosis not present

## 2024-07-08 DIAGNOSIS — D519 Vitamin B12 deficiency anemia, unspecified: Secondary | ICD-10-CM | POA: Diagnosis not present

## 2024-07-08 DIAGNOSIS — E038 Other specified hypothyroidism: Secondary | ICD-10-CM | POA: Diagnosis not present

## 2024-07-08 DIAGNOSIS — Z79899 Other long term (current) drug therapy: Secondary | ICD-10-CM | POA: Diagnosis not present

## 2024-07-23 DIAGNOSIS — E1165 Type 2 diabetes mellitus with hyperglycemia: Secondary | ICD-10-CM | POA: Diagnosis not present

## 2024-07-23 DIAGNOSIS — Z794 Long term (current) use of insulin: Secondary | ICD-10-CM | POA: Diagnosis not present

## 2024-07-23 DIAGNOSIS — I4891 Unspecified atrial fibrillation: Secondary | ICD-10-CM | POA: Diagnosis not present

## 2024-07-23 DIAGNOSIS — I428 Other cardiomyopathies: Secondary | ICD-10-CM | POA: Diagnosis not present

## 2024-07-23 DIAGNOSIS — N184 Chronic kidney disease, stage 4 (severe): Secondary | ICD-10-CM | POA: Diagnosis not present

## 2024-07-23 DIAGNOSIS — Z7689 Persons encountering health services in other specified circumstances: Secondary | ICD-10-CM | POA: Diagnosis not present

## 2024-08-07 DIAGNOSIS — N179 Acute kidney failure, unspecified: Secondary | ICD-10-CM | POA: Diagnosis not present

## 2024-08-07 DIAGNOSIS — Z7689 Persons encountering health services in other specified circumstances: Secondary | ICD-10-CM | POA: Diagnosis not present

## 2024-09-25 DIAGNOSIS — E1142 Type 2 diabetes mellitus with diabetic polyneuropathy: Secondary | ICD-10-CM | POA: Diagnosis not present

## 2024-09-25 DIAGNOSIS — R609 Edema, unspecified: Secondary | ICD-10-CM | POA: Diagnosis not present

## 2024-09-25 DIAGNOSIS — I5022 Chronic systolic (congestive) heart failure: Secondary | ICD-10-CM | POA: Diagnosis not present

## 2024-09-29 DIAGNOSIS — M48062 Spinal stenosis, lumbar region with neurogenic claudication: Secondary | ICD-10-CM | POA: Diagnosis not present

## 2024-09-29 DIAGNOSIS — N179 Acute kidney failure, unspecified: Secondary | ICD-10-CM | POA: Diagnosis not present

## 2024-09-29 DIAGNOSIS — E1121 Type 2 diabetes mellitus with diabetic nephropathy: Secondary | ICD-10-CM | POA: Diagnosis not present

## 2024-09-29 DIAGNOSIS — Z5889 Other problems related to physical environment: Secondary | ICD-10-CM | POA: Diagnosis not present

## 2024-09-29 DIAGNOSIS — I519 Heart disease, unspecified: Secondary | ICD-10-CM | POA: Diagnosis not present

## 2024-09-29 DIAGNOSIS — R262 Difficulty in walking, not elsewhere classified: Secondary | ICD-10-CM | POA: Diagnosis not present

## 2024-09-29 DIAGNOSIS — Z559 Problems related to education and literacy, unspecified: Secondary | ICD-10-CM | POA: Diagnosis not present

## 2024-09-30 DIAGNOSIS — D519 Vitamin B12 deficiency anemia, unspecified: Secondary | ICD-10-CM | POA: Diagnosis not present

## 2024-09-30 DIAGNOSIS — R7309 Other abnormal glucose: Secondary | ICD-10-CM | POA: Diagnosis not present

## 2024-09-30 DIAGNOSIS — E782 Mixed hyperlipidemia: Secondary | ICD-10-CM | POA: Diagnosis not present
